# Patient Record
Sex: Female | Born: 1950 | Race: White | Hispanic: No | Marital: Married | Smoking: Never smoker
Health system: Southern US, Community
[De-identification: ages and names within clinical notes are randomized; demographics above are authoritative.]

## PROBLEM LIST (undated history)

## (undated) DIAGNOSIS — R002 Palpitations: Secondary | ICD-10-CM

## (undated) DIAGNOSIS — F32A Depression, unspecified: Secondary | ICD-10-CM

## (undated) DIAGNOSIS — T7840XA Allergy, unspecified, initial encounter: Secondary | ICD-10-CM

## (undated) DIAGNOSIS — F329 Major depressive disorder, single episode, unspecified: Secondary | ICD-10-CM

## (undated) DIAGNOSIS — J45909 Unspecified asthma, uncomplicated: Secondary | ICD-10-CM

## (undated) DIAGNOSIS — K219 Gastro-esophageal reflux disease without esophagitis: Secondary | ICD-10-CM

## (undated) DIAGNOSIS — R519 Headache, unspecified: Secondary | ICD-10-CM

## (undated) DIAGNOSIS — D649 Anemia, unspecified: Secondary | ICD-10-CM

## (undated) HISTORY — DX: Gastro-esophageal reflux disease without esophagitis: K21.9

## (undated) HISTORY — PX: TEMPOROMANDIBULAR JOINT SURGERY: SHX35

## (undated) HISTORY — PX: BREAST BIOPSY: SHX20

## (undated) HISTORY — DX: Major depressive disorder, single episode, unspecified: F32.9

## (undated) HISTORY — PX: OTHER SURGICAL HISTORY: SHX169

## (undated) HISTORY — DX: Depression, unspecified: F32.A

## (undated) HISTORY — PX: BUNIONECTOMY: SHX129

## (undated) HISTORY — DX: Allergy, unspecified, initial encounter: T78.40XA

## (undated) HISTORY — DX: Palpitations: R00.2

## (undated) HISTORY — DX: Unspecified asthma, uncomplicated: J45.909

## (undated) HISTORY — DX: Anemia, unspecified: D64.9

## (undated) HISTORY — PX: ABDOMINAL HYSTERECTOMY: SHX81

## (undated) HISTORY — PX: VARICOSE VEIN SURGERY: SHX832

---

## 1997-07-14 ENCOUNTER — Other Ambulatory Visit: Admission: RE | Admit: 1997-07-14 | Discharge: 1997-07-14 | Payer: Self-pay | Admitting: Obstetrics & Gynecology

## 1998-08-04 ENCOUNTER — Other Ambulatory Visit: Admission: RE | Admit: 1998-08-04 | Discharge: 1998-08-04 | Payer: Self-pay | Admitting: Obstetrics & Gynecology

## 1999-11-14 ENCOUNTER — Other Ambulatory Visit: Admission: RE | Admit: 1999-11-14 | Discharge: 1999-11-14 | Payer: Self-pay | Admitting: Obstetrics & Gynecology

## 2001-01-17 ENCOUNTER — Other Ambulatory Visit: Admission: RE | Admit: 2001-01-17 | Discharge: 2001-01-17 | Payer: Self-pay | Admitting: Obstetrics & Gynecology

## 2001-01-29 ENCOUNTER — Encounter: Payer: Self-pay | Admitting: Internal Medicine

## 2001-01-29 ENCOUNTER — Ambulatory Visit (HOSPITAL_COMMUNITY): Admission: RE | Admit: 2001-01-29 | Discharge: 2001-01-29 | Payer: Self-pay | Admitting: Internal Medicine

## 2001-03-04 ENCOUNTER — Encounter: Payer: Self-pay | Admitting: Gastroenterology

## 2002-02-09 ENCOUNTER — Other Ambulatory Visit: Admission: RE | Admit: 2002-02-09 | Discharge: 2002-02-09 | Payer: Self-pay | Admitting: Obstetrics & Gynecology

## 2002-10-01 ENCOUNTER — Encounter: Payer: Self-pay | Admitting: Gastroenterology

## 2002-10-07 ENCOUNTER — Encounter: Payer: Self-pay | Admitting: Gastroenterology

## 2002-10-07 ENCOUNTER — Ambulatory Visit (HOSPITAL_COMMUNITY): Admission: RE | Admit: 2002-10-07 | Discharge: 2002-10-07 | Payer: Self-pay | Admitting: Gastroenterology

## 2003-03-11 ENCOUNTER — Other Ambulatory Visit: Admission: RE | Admit: 2003-03-11 | Discharge: 2003-03-11 | Payer: Self-pay | Admitting: Obstetrics & Gynecology

## 2003-11-10 ENCOUNTER — Ambulatory Visit (HOSPITAL_COMMUNITY): Admission: RE | Admit: 2003-11-10 | Discharge: 2003-11-10 | Payer: Self-pay | Admitting: Gastroenterology

## 2003-11-10 ENCOUNTER — Encounter: Payer: Self-pay | Admitting: Gastroenterology

## 2003-12-23 ENCOUNTER — Ambulatory Visit: Payer: Self-pay

## 2004-01-05 ENCOUNTER — Ambulatory Visit: Payer: Self-pay | Admitting: Gastroenterology

## 2004-04-10 ENCOUNTER — Other Ambulatory Visit: Admission: RE | Admit: 2004-04-10 | Discharge: 2004-04-10 | Payer: Self-pay | Admitting: Obstetrics & Gynecology

## 2005-10-18 ENCOUNTER — Ambulatory Visit: Payer: Self-pay | Admitting: Gastroenterology

## 2006-11-25 ENCOUNTER — Ambulatory Visit (HOSPITAL_COMMUNITY): Admission: RE | Admit: 2006-11-25 | Discharge: 2006-11-25 | Payer: Self-pay | Admitting: Obstetrics & Gynecology

## 2006-11-25 IMAGING — CR DG CHEST 2V
2 series · 2 of 2 positions shown · non-contrast
Comparison: None. 
 Trachea is midline.  Heart size normal.  Lungs are clear.  No pleural fluid.

CLINICAL DATA: Chronic cough, congestion, fever.
 CHEST - 2 VIEW:

[view not recorded (1 of 2)]
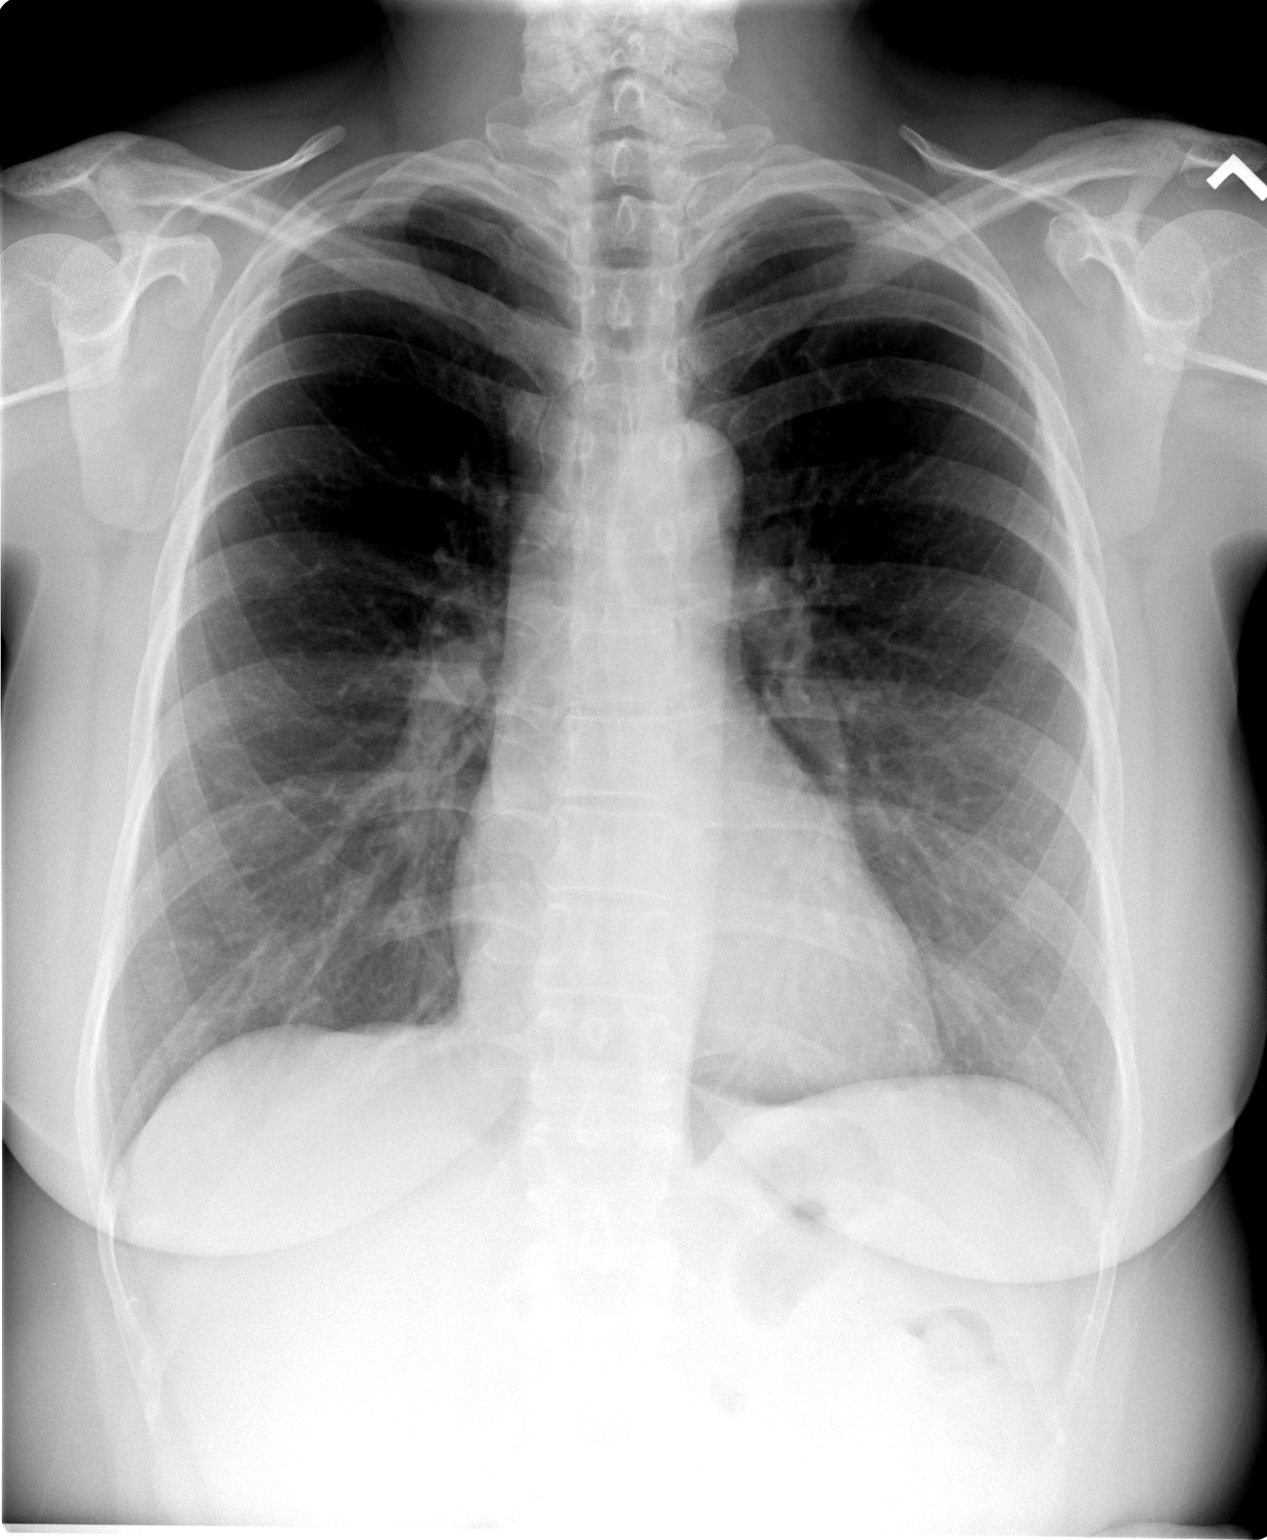

[view not recorded (2 of 2)]
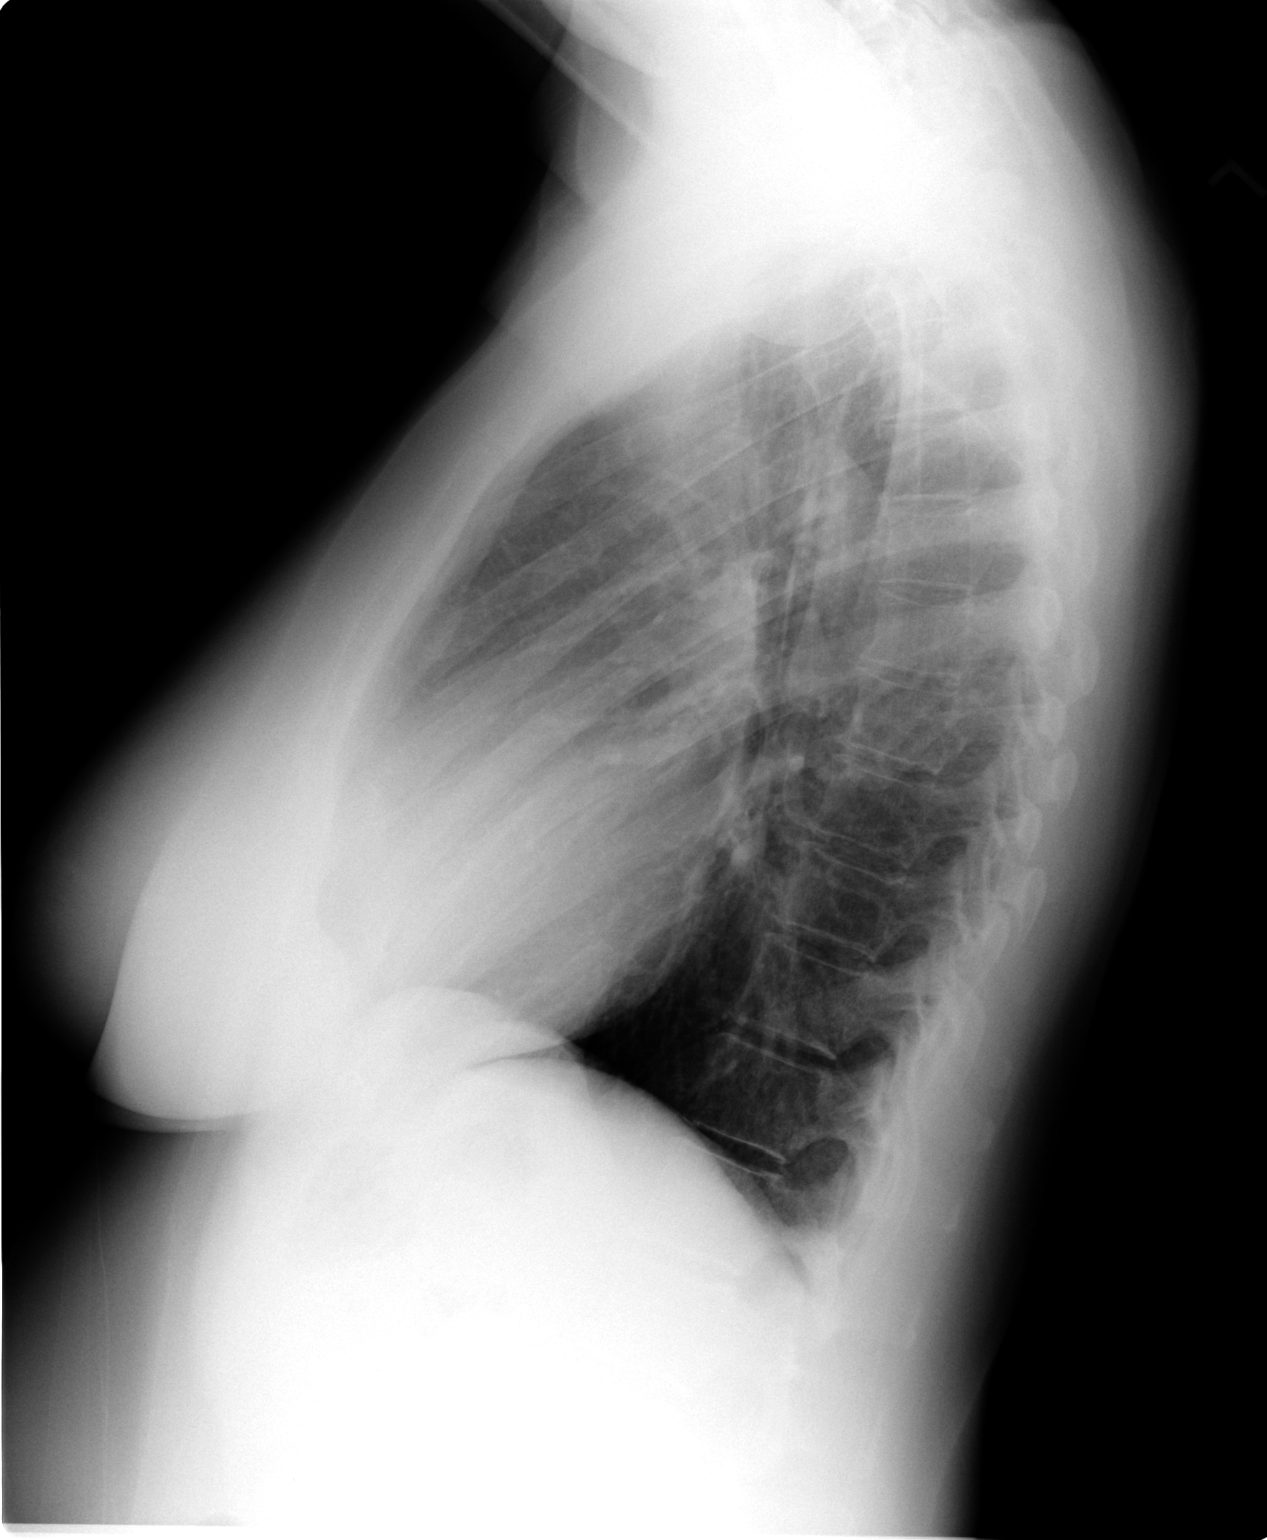

[2 of 2 positions shown; findings below may reference images not displayed]

IMPRESSION: No acute findings.

## 2006-12-03 ENCOUNTER — Ambulatory Visit (HOSPITAL_COMMUNITY): Admission: RE | Admit: 2006-12-03 | Discharge: 2006-12-04 | Payer: Self-pay | Admitting: Obstetrics & Gynecology

## 2006-12-03 ENCOUNTER — Encounter (INDEPENDENT_AMBULATORY_CARE_PROVIDER_SITE_OTHER): Payer: Self-pay | Admitting: Obstetrics & Gynecology

## 2008-01-06 ENCOUNTER — Telehealth: Payer: Self-pay | Admitting: Gastroenterology

## 2008-01-30 DIAGNOSIS — K3184 Gastroparesis: Secondary | ICD-10-CM

## 2008-01-30 DIAGNOSIS — Z862 Personal history of diseases of the blood and blood-forming organs and certain disorders involving the immune mechanism: Secondary | ICD-10-CM | POA: Insufficient documentation

## 2008-01-30 DIAGNOSIS — I6529 Occlusion and stenosis of unspecified carotid artery: Secondary | ICD-10-CM

## 2008-01-30 DIAGNOSIS — R51 Headache: Secondary | ICD-10-CM

## 2008-01-30 DIAGNOSIS — R519 Headache, unspecified: Secondary | ICD-10-CM | POA: Insufficient documentation

## 2008-01-30 DIAGNOSIS — K219 Gastro-esophageal reflux disease without esophagitis: Secondary | ICD-10-CM

## 2008-01-30 DIAGNOSIS — K5730 Diverticulosis of large intestine without perforation or abscess without bleeding: Secondary | ICD-10-CM

## 2008-01-30 DIAGNOSIS — F325 Major depressive disorder, single episode, in full remission: Secondary | ICD-10-CM

## 2008-02-04 ENCOUNTER — Ambulatory Visit: Payer: Self-pay | Admitting: Gastroenterology

## 2008-12-30 ENCOUNTER — Encounter: Admission: RE | Admit: 2008-12-30 | Discharge: 2008-12-30 | Payer: Self-pay | Admitting: Obstetrics & Gynecology

## 2009-12-05 ENCOUNTER — Ambulatory Visit (HOSPITAL_COMMUNITY)
Admission: RE | Admit: 2009-12-05 | Discharge: 2009-12-05 | Payer: Self-pay | Source: Home / Self Care | Admitting: Internal Medicine

## 2009-12-05 IMAGING — CR DG HAND COMPLETE 3+V*L*
2 series · 2 of 2 positions shown · non-contrast
Comparison: None.

CLINICAL DATA: Left hand and middle finger pain and swelling.

LEFT HAND - COMPLETE 3+ VIEW

[view not recorded (1 of 2)]
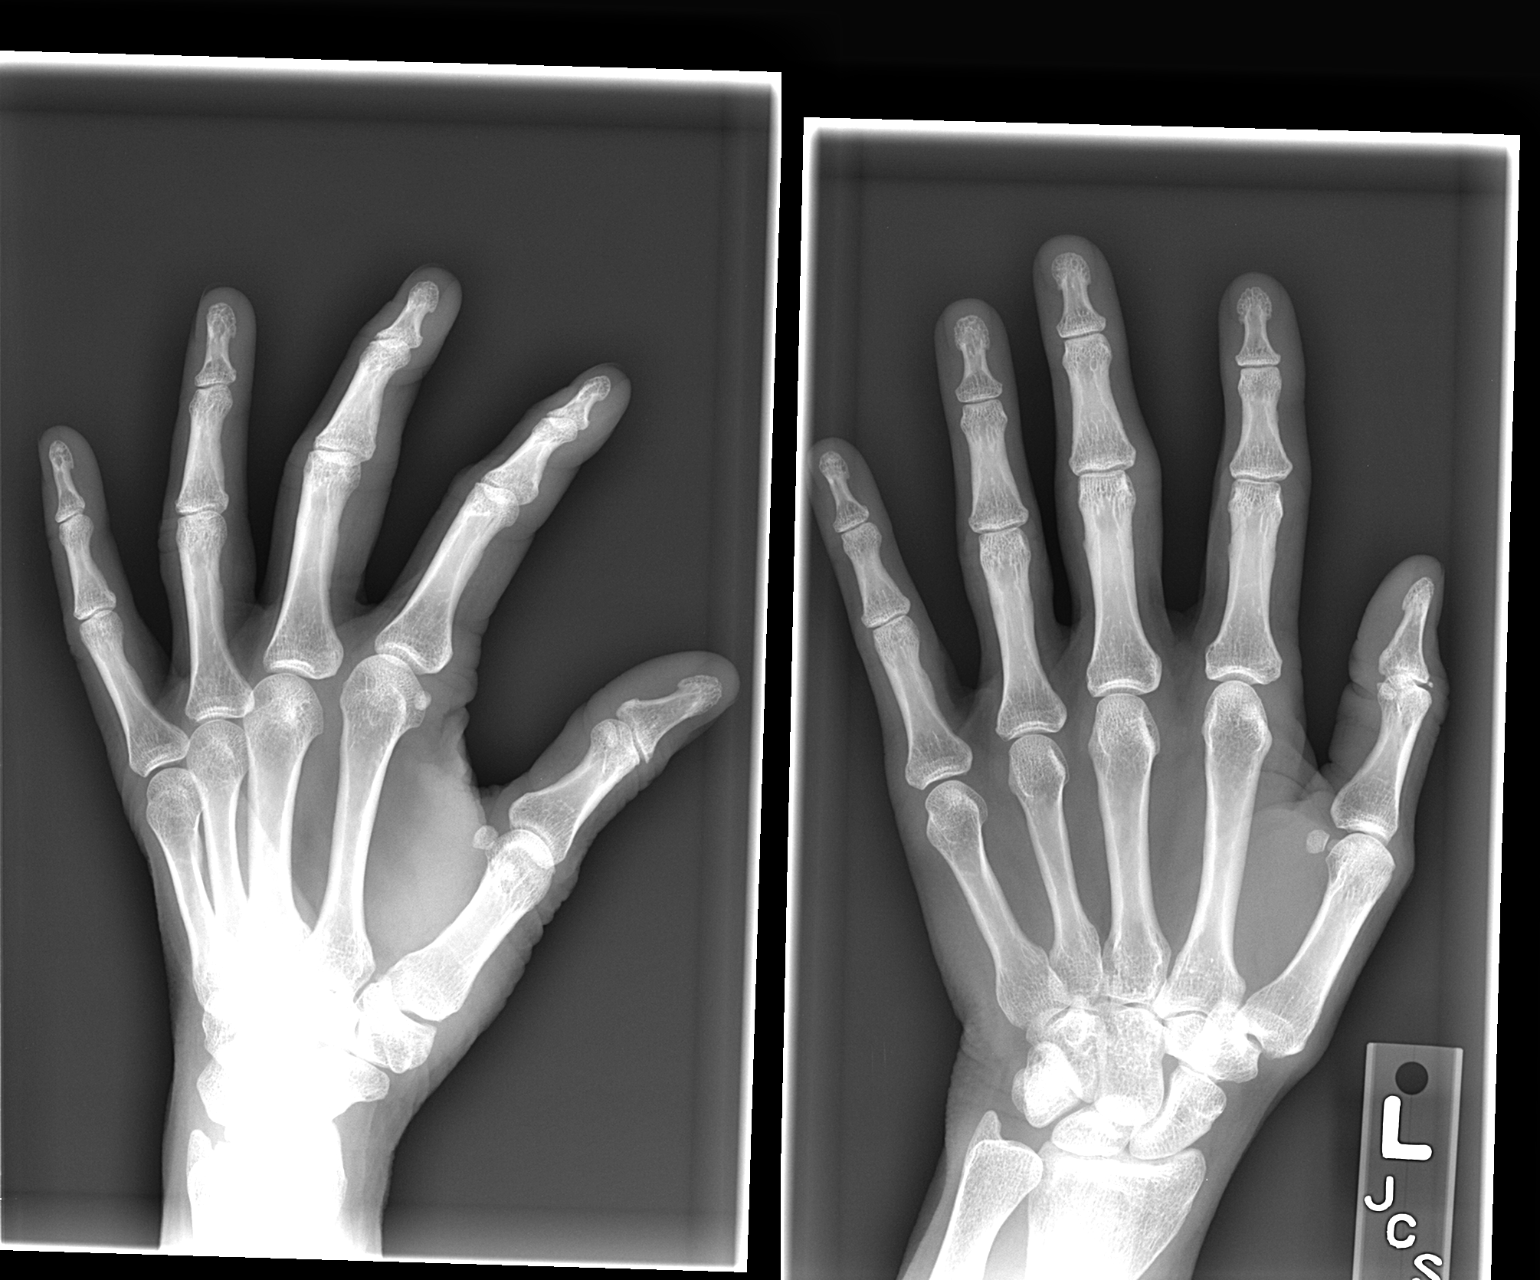

[view not recorded (2 of 2)]
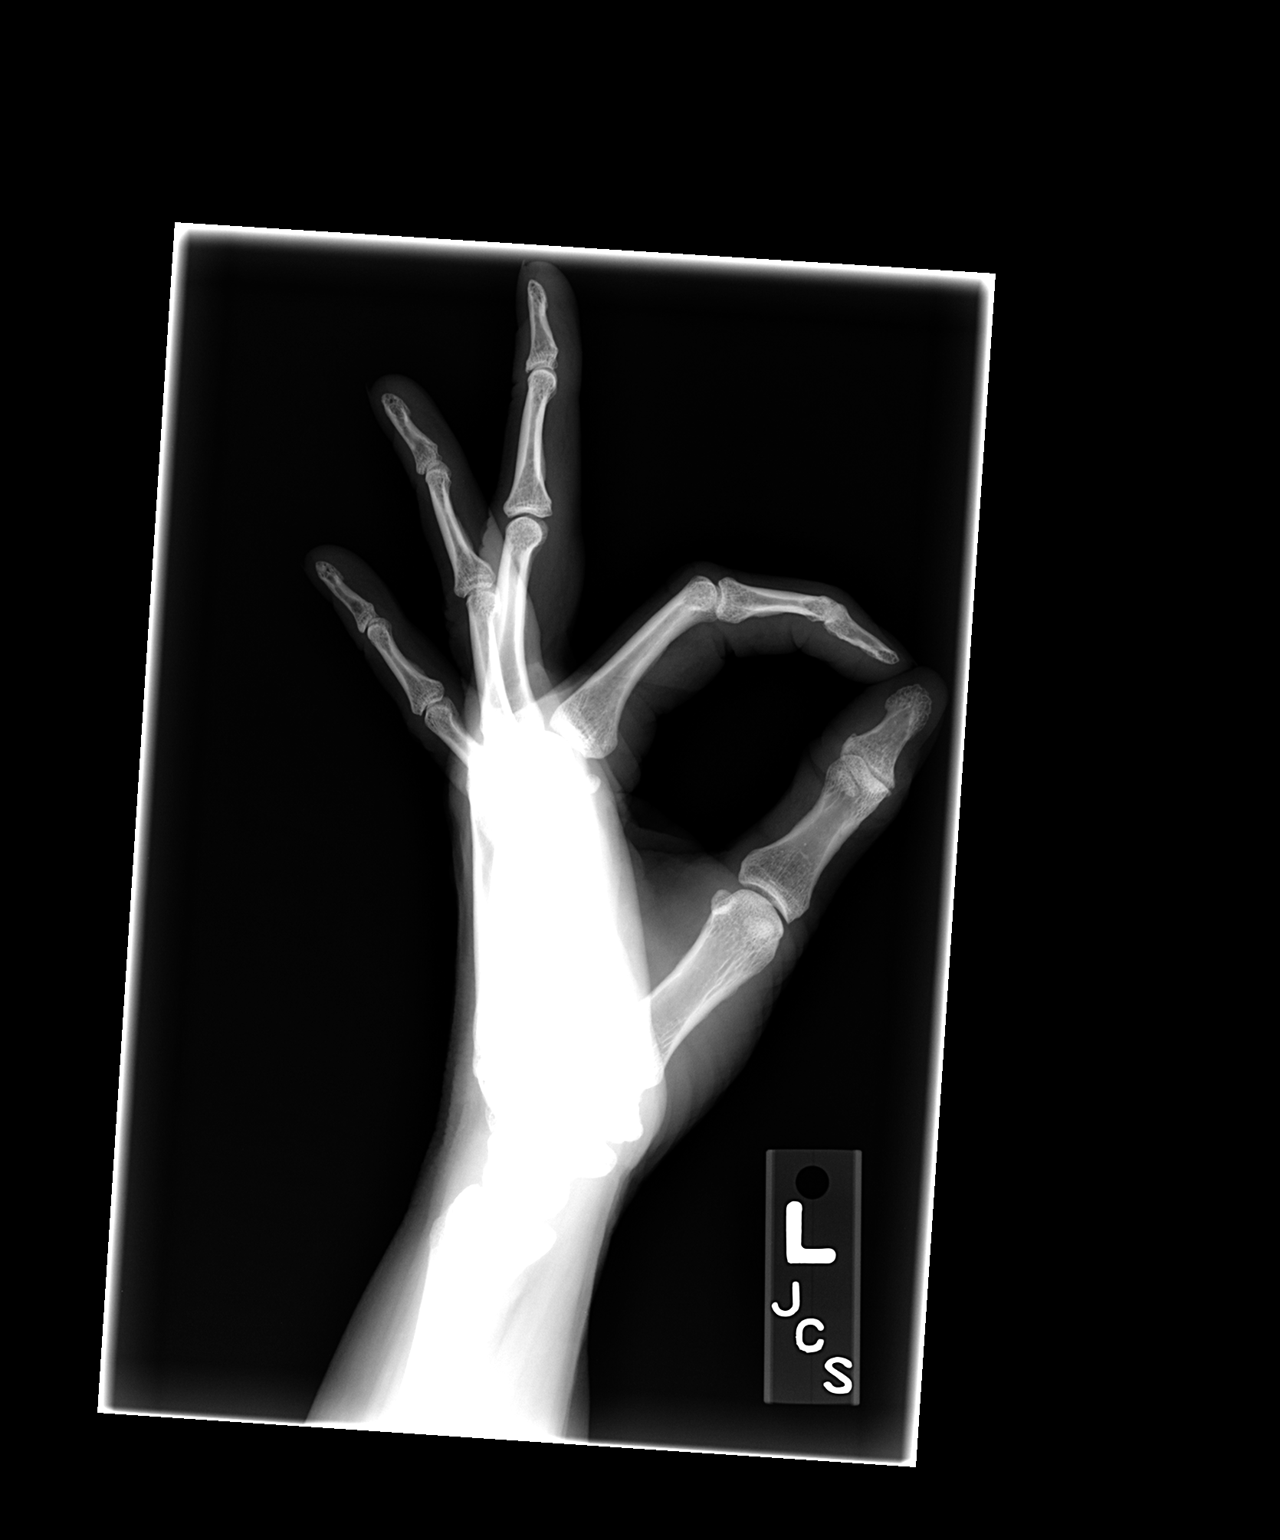

[2 of 2 positions shown; findings below may reference images not displayed]

FINDINGS: Mild soft tissue swelling is seen involving the middle
finger in the region of the PIP joint.  No evidence of soft tube
tissue gas or calcification.  No evidence of fracture or
dislocation.

Bone mineral density is normal.  No evidence of joint space
narrowing.  No evidence of marginal erosions or osteophyte
formation.  Alignment is normal.
IMPRESSION: Mild soft tissue swelling involving the middle finger in region of
PIP joint.  No other signs of arthropathy or other bone
abnormality.

## 2009-12-08 ENCOUNTER — Encounter (INDEPENDENT_AMBULATORY_CARE_PROVIDER_SITE_OTHER): Payer: Self-pay | Admitting: Internal Medicine

## 2009-12-08 ENCOUNTER — Ambulatory Visit (HOSPITAL_COMMUNITY)
Admission: RE | Admit: 2009-12-08 | Discharge: 2009-12-08 | Payer: Self-pay | Source: Home / Self Care | Attending: Internal Medicine | Admitting: Internal Medicine

## 2009-12-08 ENCOUNTER — Encounter: Payer: Self-pay | Admitting: Cardiology

## 2009-12-08 ENCOUNTER — Ambulatory Visit: Payer: Self-pay

## 2010-03-31 ENCOUNTER — Other Ambulatory Visit: Payer: Self-pay

## 2010-03-31 ENCOUNTER — Other Ambulatory Visit: Payer: Self-pay | Admitting: Gastroenterology

## 2010-03-31 MED ORDER — AMBULATORY NON FORMULARY MEDICATION: 1.0000 | Freq: Two times a day (BID) | Status: DC

## 2010-03-31 NOTE — Telephone Encounter (Signed)
Prescription will be ready for pt to pick up

## 2010-05-16 NOTE — Op Note (Signed)
NAMECHRISTENE, [name]               ACCOUNT NO.:  [id number]   MEDICAL RECORD NO.:  [id number]          PATIENT TYPE:  OIB   LOCATION:  9302                          FACILITY:  [facility]   PHYSICIAN:  Freddy Finner, M.D.   DATE OF BIRTH:  13-Aug-1950   DATE OF PROCEDURE:  12/03/2006  DATE OF DISCHARGE:                               OPERATIVE REPORT   PREOPERATIVE DIAGNOSES:  1. Complex left adnexal mass, suspected ovarian mass or pedunculated      myoma with degeneration.  2. Known uterine leiomyomata.   POSTOPERATIVE DIAGNOSES:  1. Uterine leiomyomata.  2. Left adnexal adhesions.  3. 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   OPERATIVE PROCEDURE:  Laparoscopically-assisted vaginal hysterectomy,  bilateral salpingo-oophorectomy.   INTRAOPERATIVE COMPLICATIONS:  None.   ANESTHESIA:  General endotracheal.   SURGEON:  Freddy Finner, M.D.   ASSISTANT:  Dineen Kid. Rana Snare, M.D.   The patient is a 60 year old who was discovered to have a left adnexal  mass on pelvic ultrasound following an episode of postcoital  postmenopausal bleeding.  She is known to have uterine leiomyomata and  these have been longstanding.  The mass persisted and given the concern  that it could be ovarian in the presence of fibroids, she has elected to  proceed with definitive therapy.   She was admitted on the morning of surgery.  She was given a bolus of  Ancef IV preoperatively.  She was placed in knee-high PAS hose.  She was  brought to the operating room, placed under adequate general  endotracheal anesthesia, placed in the dorsal lithotomy position using  the Salineno stirrup system.  Betadine prep was carried out using scrub and  solution with prep of the abdomen, perineum and vagina.  The bladder was  evacuated with sterile technique.  A Hulka tenaculum was attached to the  cervix under direct visualization.  The vaginal vault was somewhat  narrowed.  Sterile drapes were applied.  Two small incisions were made,  one at the umbilicus, one just above the symphysis.  Through the upper  incision an 11-mm bladed disposable trocar was introduced while  elevating the anterior abdominal wall manually.  Direct inspection  revealed adequate placement of the device with no evidence of injury on  entry.  A second trocar was placed in the lower incision, in this case a  5-mm bladed disposable trocar.  Through it various instruments were used  during the procedure including a blunt probe, spring-loaded grasping  forceps and the Nezhat irrigation system.  A systematic examination of  the pelvic and abdominal contents was carried out.  There was a single  band-like adhesion of the right ovary to the right pelvic sidewall.  The  tube and ovary on this side were otherwise normal.  On the left side  there were some adhesions of epiploica of sigmoid to the  infundibulopelvic ligament.  These were lysed.  The left ovary was  adherent to the left pelvic sidewall but was freed easily with blunt  probe and traction for destruction of the adhesions.  The ovary itself  was small and appropriate for the postmenopausal state.  There was a  nodule apparently measuring approximately 2 cm in the broad ligament of  the lower uterine segment.  It is presumed that this is the mass that  was identified.  There was no other evidence of peritoneal or upper  abdominal disease.  The appendix was visualized.  The liver and upper  abdomen could not be visualized because of adhesions from childhood  surgery.  Using the Gyrus tripolar device, the infundibulopelvic  ligament, round ligament, upper broad ligament of the right side was  progressively sealed and divided.  This was carried down to a level just  above the uterine arteries.  The left side was then treated essentially  identically after a lysis of adhesions.  Attention was turned vaginally.  A posterior  weighted vaginal retractor was placed.  Deaver retractors  were used anteriorly, then laterally for exposure.  A posterior weighted  speculum was placed.  The cervix was grasped ultimately with a single-  tooth tenaculum.  The vaginal vault was narrow and the cervix small.  A  colpotomy incision was made while tenting the mucosa posterior to the  cervix.  After opening the cul-de-sac, a Bonnano posterior weighted  retractor was placed.  The cervix was circumscribed with a scalpel to  release the mucosa.  The bladder was advanced off the cervix.  The  uterosacral pedicles were sealed and divided using the LigaSure system.  A bladder pillar was separately developed in a similar fashion.  The  dissection of the bladder off the cervix was continued and anterior  peritoneum opened.  The cardinal ligament pedicles and vessel pedicles  were then progressively sealed and divided on each side.  This allowed  delivery of the uterus, tubes, and ovaries through the vaginal  introitus.  The bleeding source on the right sidewall was controlled  with the LigaSure system.  Angles of the vagina were anchored to  uterosacrals with mattress sutures of 0 Monocryl.  The uterosacrals were  plicated and the posterior peritoneum closed with interrupted 0 Monocryl  suture.  The cuff was closed vertically with figure-of-eights of 0  Monocryl.  Reinspection laparoscopically was then carried out using the  Nezhat system.  One bleeding source along the right pelvic sidewall was  easily controlled with the bipolar coagulation.  Hemostasis was  confirmed under reduced intra-abdominal pressure and irrigation.  The  irrigating solution was aspirated from the abdomen and all gas was  allowed to escape from the abdomen.  The instruments were removed.  The  skin incisions were anesthetized with 0.25% plain Marcaine.  Subcuticular sutures of 3-0 Dexon were then used to close the incisions.  Steri-Strips were applied to the  lower incision.  A sterile dressing was  applied at the umbilicus.  The patient was awakened, taken to the  recovery room in good condition.      Freddy Finner, M.D.  Electronically Signed     WRN/MEDQ  D:  12/03/2006  T:  12/03/2006  Job:  [id number]

## 2010-05-16 NOTE — H&P (Signed)
[name], [name]               ACCOUNT NO.:  [id number]   MEDICAL RECORD NO.:  [id number]          PATIENT TYPE:  AMB   LOCATION:                                FACILITY:  [facility]   PHYSICIAN:  Freddy Finner, M.D.   DATE OF BIRTH:  21-Aug-1950   DATE OF ADMISSION:  12/03/2006  DATE OF DISCHARGE:  11/25/2006                              HISTORY & PHYSICAL   ADMITTING DIAGNOSIS:  Complex left ovarian cyst uterine leiomyomata.   HISTORY OF PRESENT ILLNESS:  The patient is a 60 year old white married  female gravida 2, para 2, who was seen in the office in early September  complaining of postcoital bleeding and painful intercourse.  Pelvic  ultrasound identified a complex left adnexal mass consistent with  ovarian dermoid or endometrioma.  The patient is known to have a  longstanding uterine leiomyomata which were multiple.  The mass  persisted, and based on the combination of findings including a complex  cyst, the possibility of a small contralateral dermoid and the presence  of fibroids, we have elected to proceed with laparoscopic assisted  vaginal hysterectomy bilateral salpingo-oophorectomy.  She is admitted  now for that purpose.  Potential risk of procedure including infection,  injury to other organs, intraoperative and postoperative hemorrhage and  the possibility of converting to an abdominal procedure have been  discussed and accepted by the patient.   Her current review of systems is otherwise negative. There are no  cardiopulmonary GI or GU complaints at the present time.  She did have a  persistent cough for which she received two courses of antibiotics and a  rapidly tapering prednisone dose pack which was completed about 72 hours  prior to admission which has cleared her cough.  She does occasionally  have low grade temperature in the 99 rates.   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  For this, she takes  Singulair.  She  has tachy arrhythmias for which she takes Toprol XL/metoprolol 25 mg.  She also takes Zyrtec D episodically.  She has GERD for which she takes  Nexium 40 mg a day.  Her most recent hormone replacement therapy for  menopausal symptoms was Femhrt 1 mg/5 mg.  She also takes domperidone 10  mg 2-4 times a day.  She takes Macrodantin 50 mg p.r.n. as needed.   ALLERGIES:  REGLAN AND PENICILLIN.   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  She has had two cesarean deliveries.  Her first was for a  breech, second was for surgically scarred uterus.   SOCIAL HISTORY:  She does not use cigarettes or alcohol.  She has never  had a blood transfusion.   FAMILY HISTORY:  Remarkable for uterine cancer and for breast cancer.   PHYSICAL EXAMINATION:  HEENT:  Grossly within normal limits.  VITAL SIGNS:  Blood pressure in the office 119/62.  NECK:  Thyroid gland is not palpably enlarged  BREAST:  Normal.  No palpable masses.  No nipple discharge.  No skin  change.  (mammogram was normal in November 2007).  HEART:  Normal sinus rhythm without murmurs, rubs or gallops audible to  my examination.  CHEST:  Clear to auscultation throughout (recent chest x-ray was  normal).  ABDOMEN:  Soft, nontender without appreciable organomegaly or palpable  masses.  PELVIC:  External genitalia, vagina and cervix were normal to  inspection.  Bimanual reveals uterus to be anterior position 8-9 cm  size.  The adnexal mass was not easily palpable distal and bimanual  exam.  RECTUM:  The rectum is palpably normal.  Rectovaginal exam confirms the  above findings.  EXTREMITIES:  Without cyanosis, clubbing or edema.   ASSESSMENT:  1. Complex left adnexal mass.  2. Uterine leiomyomata.  3. Family history of uterine cancer and breast cancer.   PLAN:  Laparoscopically assisted vaginal hysterectomy, bilateral  salpingo-oophorectomy.  She will begin antibiotic  prophylaxis  perioperatively and will be placed in serial compression hose for deep  vein thrombosis prophylaxis.      Freddy Finner, M.D.  Electronically Signed     WRN/MEDQ  D:  12/02/2006  T:  12/03/2006  Job:  [id number]

## 2010-05-19 NOTE — Assessment & Plan Note (Signed)
[facility]                           GASTROENTEROLOGY OFFICE NOTE   NAME:Boerner, [name]                      MRN:          [id number]  DATE:10/18/2005                            DOB:          10-11-1950    PROBLEM:  Gastroparesis and gastroesophageal reflux disease.   Ms. [name] has returned for an annual visit.  On domperidone 10 mg four  times a day she is doing quite well.  She has no GI complaints, including  reflux, nausea or abdominal pain.  She altogether is feeling quite well.   MEDICATIONS:  Wellbutrin, Zyrtec, Singulair, an oral contraceptive,  domperidone, Nexium and Toprol XL.   PHYSICAL EXAMINATION:  Pulse 62, blood pressure 110/70, weight 148.   IMPRESSION:  1. Gastroesophageal reflux disease - exacerbated by gastroparesis - well      controlled with domperidone and Nexium.  2. Idiopathic gastroparesis.  3. Diverticulosis.   RECOMMENDATIONS:  1. Continue current regimen.  I explained the risk of arrhythmias with      domperidone which are similar to cisapride, which was pulled from the      [place] market.  She appears to understand this and wishes to continue      domperidone.  2. Followup colonoscopy in approximately 5 years.       Barbette Hair. Arlyce Dice, MD,FACG      RDK/MedQ  DD:  10/18/2005  DT:  10/19/2005  Job #:  [id number]   cc:   Lovenia Kim, D.O.

## 2010-05-19 NOTE — Discharge Summary (Signed)
[name], [name]               ACCOUNT NO.:  [id number]   MEDICAL RECORD NO.:  [id number]          PATIENT TYPE:  OIB   LOCATION:  9302                          FACILITY:  [facility]   PHYSICIAN:  Freddy Finner, M.D.   DATE OF BIRTH:  March 06, 1950   DATE OF ADMISSION:  12/03/2006  DATE OF DISCHARGE:  12/04/2006                               DISCHARGE SUMMARY   DISCHARGE DIAGNOSES:  Clinical findings of complex left adnexal mass  which turned out to be an intraligamentous benign uterine leiomyoma.  Addition uterine benign leiomyomata were identified as well as uterine  adenomyosis   OPERATIVE PROCEDURE:  1. Laparoscopically assisted vaginal hysterectomy.  2. Bilateral salpingo-oophorectomy.   INTRAOPERATIVE AND POSTOPERATIVE COMPLICATIONS:  None.   DISPOSITION:  The patient was in satisfactory improved condition at the  time of her discharge on the 1st postoperative day.  She was ambulating  without assistance, having adequate bowel and bladder function.  She was  discharged home on a regular diet with followup in 2 weeks with routine  discharge instructions.   DISCHARGE MEDICATIONS:  1. She was given Percocet 5/325 to be taken as needed for      postoperative pain.  2. She is to continue her Premarin orally for hormone replacement      therapy.   HISTORY OF PRESENT ILLNESS AND PHYSICAL EXAMINATION:  Details of the  present illness, past history, family history, review of systems and  physical exam are recorded in the admission note.  Briefly, the physical  findings were remarkable for uterine leiomyomata and for a complex left  adnexal mass which appeared to be ovarian in origin by ultrasound.  She  was admitted at this time for surgery.   LABORATORY DATA DURING HOSPITALIZATION:  Included a normal EKG, rhythm  strip, a normal CBC on admission with hemoglobin of 12.4, normal  prothrombin time, PTT and urinalysis on admission and postoperative  hemoglobin of 9.4.   HOSPITAL  COURSE:  The patient was admitted on the morning of surgery.  She was taken to the operating room after receiving a bolus of Ancef  preoperatively.  She was placed in PAS compression hose.  She was taken  to the operating room, placed under adequate general endotracheal  anesthesia, where the above surgical procedure was accomplished.  There  were no significant intraoperative complications.  Postoperatively, she  remained afebrile.  She  had no significant postoperative complications.  By the evening of the  first postoperative day, she was ambulating without assistance.  She had  further disposition as noted above.  She is to resume all of her  preoperative medications which are recorded in the reconciliation sheet  and the body of the chart.      Freddy Finner, M.D.  Electronically Signed     WRN/MEDQ  D:  01/11/2007  T:  01/12/2007  Job:  [id number]

## 2010-08-14 ENCOUNTER — Other Ambulatory Visit: Payer: Self-pay

## 2010-10-09 LAB — CBC
HCT: 27.2 — ABNORMAL LOW
Platelets: 308
RDW: 12.7

## 2010-10-10 LAB — URINALYSIS, ROUTINE W REFLEX MICROSCOPIC
Bilirubin Urine: NEGATIVE
Ketones, ur: NEGATIVE
Nitrite: NEGATIVE
Specific Gravity, Urine: 1.025
Urobilinogen, UA: 0.2

## 2010-10-10 LAB — PROTIME-INR: INR: 0.9

## 2010-10-10 LAB — CBC
Hemoglobin: 12.4
RBC: 3.96
RDW: 12.5

## 2010-10-10 LAB — URINE MICROSCOPIC-ADD ON

## 2010-10-10 LAB — APTT: aPTT: 29

## 2011-03-20 ENCOUNTER — Other Ambulatory Visit: Payer: Self-pay | Admitting: Dermatology

## 2011-04-26 ENCOUNTER — Other Ambulatory Visit: Payer: Self-pay | Admitting: Dermatology

## 2011-09-05 ENCOUNTER — Other Ambulatory Visit: Payer: Self-pay | Admitting: Gastroenterology

## 2011-09-05 ENCOUNTER — Encounter: Payer: Self-pay | Admitting: Gastroenterology

## 2011-09-06 MED ORDER — AMBULATORY NON FORMULARY MEDICATION: 1.0000 | Freq: Two times a day (BID) | Status: DC

## 2011-09-06 NOTE — Telephone Encounter (Signed)
L/M for patient that prescription was printed for her to pick up today

## 2011-11-09 ENCOUNTER — Encounter: Payer: Self-pay | Admitting: Gastroenterology

## 2013-01-12 ENCOUNTER — Other Ambulatory Visit: Payer: Self-pay | Admitting: Obstetrics & Gynecology

## 2013-01-12 DIAGNOSIS — R928 Other abnormal and inconclusive findings on diagnostic imaging of breast: Secondary | ICD-10-CM

## 2013-01-20 ENCOUNTER — Ambulatory Visit
Admission: RE | Admit: 2013-01-20 | Discharge: 2013-01-20 | Disposition: A | Discharge status: Home / Self Care | Payer: BC Managed Care – PPO | Source: Ambulatory Visit | Attending: Obstetrics & Gynecology | Admitting: Obstetrics & Gynecology

## 2013-01-20 DIAGNOSIS — R928 Other abnormal and inconclusive findings on diagnostic imaging of breast: Secondary | ICD-10-CM

## 2013-03-08 ENCOUNTER — Ambulatory Visit (INDEPENDENT_AMBULATORY_CARE_PROVIDER_SITE_OTHER): Payer: BC Managed Care – PPO | Admitting: Family Medicine

## 2013-03-08 VITALS — BP 114/70 | HR 84 | Temp 99.0°F | Ht 66.0 in | Wt 155.0 lb

## 2013-03-08 DIAGNOSIS — J040 Acute laryngitis: Secondary | ICD-10-CM

## 2013-03-08 DIAGNOSIS — J029 Acute pharyngitis, unspecified: Secondary | ICD-10-CM

## 2013-03-08 DIAGNOSIS — R059 Cough, unspecified: Secondary | ICD-10-CM

## 2013-03-08 DIAGNOSIS — R05 Cough: Secondary | ICD-10-CM

## 2013-03-08 DIAGNOSIS — R040 Epistaxis: Secondary | ICD-10-CM

## 2013-03-08 DIAGNOSIS — J329 Chronic sinusitis, unspecified: Secondary | ICD-10-CM

## 2013-03-08 DIAGNOSIS — J209 Acute bronchitis, unspecified: Secondary | ICD-10-CM

## 2013-03-08 MED ORDER — HYDROCODONE-HOMATROPINE 5-1.5 MG/5ML PO SYRP: 5.0000 mL | ORAL_SOLUTION | Freq: Three times a day (TID) | ORAL | Status: DC | PRN

## 2013-03-08 MED ORDER — AZITHROMYCIN 250 MG PO TABS: ORAL_TABLET | ORAL | Status: DC

## 2013-03-08 NOTE — Patient Instructions (Signed)
Sinusitis  Sinusitis is redness, soreness, and swelling (inflammation) of the paranasal sinuses. Paranasal sinuses are air pockets within the bones of your face (beneath the eyes, the middle of the forehead, or above the eyes). In healthy paranasal sinuses, mucus is able to drain out, and air is able to circulate through them by way of your nose. However, when your paranasal sinuses are inflamed, mucus and air can become trapped. This can allow bacteria and other germs to grow and cause infection.  Sinusitis can develop quickly and last only a short time (acute) or continue over a long period (chronic). Sinusitis that lasts for more than 12 weeks is considered chronic.   CAUSES   Causes of sinusitis include:   Allergies.   Structural abnormalities, such as displacement of the cartilage that separates your nostrils (deviated septum), which can decrease the air flow through your nose and sinuses and affect sinus drainage.   Functional abnormalities, such as when the small hairs (cilia) that line your sinuses and help remove mucus do not work properly or are not present.  SYMPTOMS   Symptoms of acute and chronic sinusitis are the same. The primary symptoms are pain and pressure around the affected sinuses. Other symptoms include:   Upper toothache.   Earache.   Headache.   Bad breath.   Decreased sense of smell and taste.   A cough, which worsens when you are lying flat.   Fatigue.   Fever.   Thick drainage from your nose, which often is green and may contain pus (purulent).   Swelling and warmth over the affected sinuses.  DIAGNOSIS   Your caregiver will perform a physical exam. During the exam, your caregiver may:   Look in your nose for signs of abnormal growths in your nostrils (nasal polyps).   Tap over the affected sinus to check for signs of infection.   View the inside of your sinuses (endoscopy) with a special imaging device with a light attached (endoscope), which is inserted into your  sinuses.  If your caregiver suspects that you have chronic sinusitis, one or more of the following tests may be recommended:   Allergy tests.   Nasal culture A sample of mucus is taken from your nose and sent to a lab and screened for bacteria.   Nasal cytology A sample of mucus is taken from your nose and examined by your caregiver to determine if your sinusitis is related to an allergy.  TREATMENT   Most cases of acute sinusitis are related to a viral infection and will resolve on their own within 10 days. Sometimes medicines are prescribed to help relieve symptoms (pain medicine, decongestants, nasal steroid sprays, or saline sprays).   However, for sinusitis related to a bacterial infection, your caregiver will prescribe antibiotic medicines. These are medicines that will help kill the bacteria causing the infection.   Rarely, sinusitis is caused by a fungal infection. In theses cases, your caregiver will prescribe antifungal medicine.  For some cases of chronic sinusitis, surgery is needed. Generally, these are cases in which sinusitis recurs more than 3 times per year, despite other treatments.  HOME CARE INSTRUCTIONS    Drink plenty of water. Water helps thin the mucus so your sinuses can drain more easily.   Use a humidifier.   Inhale steam 3 to 4 times a day (for example, sit in the bathroom with the shower running).   Apply a warm, moist washcloth to your face 3 to 4 times a day,   or as directed by your caregiver.   Use saline nasal sprays to help moisten and clean your sinuses.   Take over-the-counter or prescription medicines for pain, discomfort, or fever only as directed by your caregiver.  SEEK IMMEDIATE MEDICAL CARE IF:   You have increasing pain or severe headaches.   You have nausea, vomiting, or drowsiness.   You have swelling around your face.   You have vision problems.   You have a stiff neck.   You have difficulty breathing.  MAKE SURE YOU:    Understand these  instructions.   Will watch your condition.   Will get help right away if you are not doing well or get worse.  Document Released: 12/18/2004 Document Revised: 03/12/2011 Document Reviewed: 01/02/2011  [employer] Patient Information 2014 [employer]          Bronchitis  Bronchitis is inflammation of the airways that extend from the windpipe into the lungs (bronchi). The inflammation often causes mucus to develop, which leads to a cough. If the inflammation becomes severe, it may cause shortness of breath.  CAUSES   Bronchitis may be caused by:    Viral infections.    Bacteria.    Cigarette smoke.    Allergens, pollutants, and other irritants.   SIGNS AND SYMPTOMS   The most common symptom of bronchitis is a frequent cough that produces mucus. Other symptoms include:   Fever.    Body aches.    Chest congestion.    Chills.    Shortness of breath.    Sore throat.   DIAGNOSIS   Bronchitis is usually diagnosed through a medical history and physical exam. Tests, such as chest X-rays, are sometimes done to rule out other conditions.   TREATMENT   You may need to avoid contact with whatever caused the problem (smoking, for example). Medicines are sometimes needed. These may include:   Antibiotics. These may be prescribed if the condition is caused by bacteria.   Cough suppressants. These may be prescribed for relief of cough symptoms.    Inhaled medicines. These may be prescribed to help open your airways and make it easier for you to breathe.    Steroid medicines. These may be prescribed for those with recurrent (chronic) bronchitis.  HOME CARE INSTRUCTIONS   Get plenty of rest.    Drink enough fluids to keep your urine clear or pale yellow (unless you have a medical condition that requires fluid restriction). Increasing fluids may help thin your secretions and will prevent dehydration.    Only take over-the-counter or prescription medicines as directed by your health care  provider.   Only take antibiotics as directed. Make sure you finish them even if you start to feel better.   Avoid secondhand smoke, irritating chemicals, and strong fumes. These will make bronchitis worse. If you are a smoker, quit smoking. Consider using nicotine gum or skin patches to help control withdrawal symptoms. Quitting smoking will help your lungs heal faster.    Put a cool-mist humidifier in your bedroom at night to moisten the air. This may help loosen mucus. Change the water in the humidifier daily. You can also run the hot water in your shower and sit in the bathroom with the door closed for 5 10 minutes.    Follow up with your health care provider as directed.    Wash your hands frequently to avoid catching bronchitis again or spreading an infection to others.   SEEK MEDICAL CARE IF:  Your symptoms   do not improve after 1 week of treatment.   SEEK IMMEDIATE MEDICAL CARE IF:   Your fever increases.   You have chills.    You have chest pain.    You have worsening shortness of breath.    You have bloody sputum.   You faint.   You have lightheadedness.   You have a severe headache.    You vomit repeatedly.  MAKE SURE YOU:    Understand these instructions.   Will watch your condition.   Will get help right away if you are not doing well or get worse.  Document Released: 12/18/2004 Document Revised: 10/08/2012 Document Reviewed: 08/12/2012  [employer] Patient Information 2014 [employer]

## 2013-03-08 NOTE — Progress Notes (Signed)
Subjective:    Patient ID: [name], female    DOB: 02-Nov-1950, 63 y.o.   MRN: [id number]  HPI This chart was scribed for Robyn Haber, MD by Thea Alken, ED Scribe. This patient was seen in room 8 and the patient's care was started at 11:58 AM.  HPI Comments: [name] is a 63 y.o. female who presents to the [facility] complaining of cold symptoms onset 4 days ago. Pt states that 4 days ago her symptoms started with a sore throat. Pt states that later that night she developed laryngitis. She reports that 2 days ago is when she started having cough, nasal congestion and a low grade fever. She states that her cough has been tight but is loose at times.  Pt reports that she took her temperature this morning it was 100. Pt reports h/o asthma. She reports that she does not have an inhaler but does have a spiriva.    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     Past Medical History  Diagnosis Date   Allergy    Anemia    Asthma    Depression    GERD (gastroesophageal reflux disease)    Allergies  Allergen Reactions   Metoclopramide Hcl    Penicillins    Sulfamethoxazole-Trimethoprim    Ciprofloxacin Palpitations   Prior to Admission medications   Medication Sig Start Date End Date Taking? Authorizing Provider  AMBULATORY NON FORMULARY MEDICATION Take 1 tablet by mouth 2 (two) times daily. Medication Name: Domperidone 10mg  09/06/11  Yes Inda Castle, MD  Cholecalciferol (VITAMIN D) 2000 UNITS tablet Take 2,000 Units by mouth daily.   Yes Historical Provider, MD  magnesium oxide (MAG-OX) 400 MG tablet Take 400 mg by mouth daily.   Yes Historical Provider, MD  meloxicam (MOBIC) 15 MG tablet Take 15 mg by mouth daily.   Yes Historical Provider, MD  montelukast (SINGULAIR) 10 MG tablet Take 10 mg by mouth at bedtime.   Yes Historical Provider, MD  Pantoprazole Sodium (PROTONIX PO) Take 40 mg by mouth daily.   Yes Historical Provider, MD  verapamil  (COVERA HS) 180 MG (CO) 24 hr tablet Take 180 mg by mouth at bedtime.   Yes Historical Provider, MD  vitamin B-12 (CYANOCOBALAMIN) 1000 MCG tablet Take 1,000 mcg by mouth daily.   Yes Historical Provider, MD    Review of Systems  Constitutional: Positive for fever. Negative for chills.  HENT: Positive for congestion and sore throat.   Respiratory: Positive for cough.   Gastrointestinal: Negative for nausea, vomiting and diarrhea.       Objective:   Physical Exam  Nursing note and vitals reviewed. Constitutional: She is oriented to person, place, and time. She appears well-developed and well-nourished. No distress.  HENT:  Head: Normocephalic and atraumatic.  Eyes: EOM are normal.  Neck: Neck supple.  Cardiovascular: Normal rate.   Pulmonary/Chest: Effort normal. She has rales.  Musculoskeletal: Normal range of motion.  Neurological: She is alert and oriented to person, place, and time.  Skin: Skin is warm and dry.  Psychiatric: She has a normal mood and affect. Her behavior is normal.   a few rales at the bases Oropharynx red without exudates or airway swelling    Assessment & Plan:   Sinusitis - Plan: HYDROcodone-homatropine (HYCODAN) 5-1.5 MG/5ML syrup, azithromycin (ZITHROMAX Z-PAK) 250 MG tablet  Acute bronchitis - Plan: HYDROcodone-homatropine (HYCODAN) 5-1.5 MG/5ML syrup, azithromycin (ZITHROMAX Z-PAK) 250 MG tablet  Acute  pharyngitis - Plan: HYDROcodone-homatropine (HYCODAN) 5-1.5 MG/5ML syrup, azithromycin (ZITHROMAX Z-PAK) 250 MG tablet  Laryngitis - Plan: HYDROcodone-homatropine (HYCODAN) 5-1.5 MG/5ML syrup, azithromycin (ZITHROMAX Z-PAK) 250 MG tablet  Cough - Plan: HYDROcodone-homatropine (HYCODAN) 5-1.5 MG/5ML syrup, azithromycin (ZITHROMAX Z-PAK) 250 MG tablet  Epistaxis - Plan: HYDROcodone-homatropine (HYCODAN) 5-1.5 MG/5ML syrup, azithromycin (ZITHROMAX Z-PAK) 250 MG tablet  Signed, Robyn Haber, MD

## 2013-03-11 ENCOUNTER — Ambulatory Visit (INDEPENDENT_AMBULATORY_CARE_PROVIDER_SITE_OTHER): Payer: BC Managed Care – PPO | Admitting: Emergency Medicine

## 2013-03-11 ENCOUNTER — Encounter: Payer: Self-pay | Admitting: Emergency Medicine

## 2013-03-11 VITALS — BP 144/72 | HR 102 | Temp 100.0°F | Resp 18 | Ht 64.5 in | Wt 156.0 lb

## 2013-03-11 DIAGNOSIS — R0602 Shortness of breath: Secondary | ICD-10-CM

## 2013-03-11 DIAGNOSIS — J209 Acute bronchitis, unspecified: Secondary | ICD-10-CM

## 2013-03-11 DIAGNOSIS — J329 Chronic sinusitis, unspecified: Secondary | ICD-10-CM

## 2013-03-11 MED ORDER — ALBUTEROL SULFATE HFA 108 (90 BASE) MCG/ACT IN AERS: 1.0000 | INHALATION_SPRAY | RESPIRATORY_TRACT | Status: DC | PRN

## 2013-03-11 MED ORDER — PREDNISONE 10 MG PO TABS: ORAL_TABLET | ORAL | Status: DC

## 2013-03-11 MED ORDER — IPRATROPIUM-ALBUTEROL 0.5-2.5 (3) MG/3ML IN SOLN: 3.0000 mL | Freq: Once | RESPIRATORY_TRACT | Status: DC

## 2013-03-11 MED ORDER — AMBULATORY NON FORMULARY MEDICATION: 1.0000 | Freq: Two times a day (BID) | Status: DC

## 2013-03-11 MED ORDER — BENZONATATE 200 MG PO CAPS: 200.0000 mg | ORAL_CAPSULE | Freq: Three times a day (TID) | ORAL | Status: DC | PRN

## 2013-03-11 MED ORDER — DOXYCYCLINE HYCLATE 100 MG PO TABS: 100.0000 mg | ORAL_TABLET | Freq: Two times a day (BID) | ORAL | Status: DC

## 2013-03-11 NOTE — Patient Instructions (Signed)
Pneumonia, Adult Pneumonia is an infection of the lungs. It may be caused by a germ (virus or bacteria). Some types of pneumonia can spread easily from person to person. This can happen when you cough or sneeze. HOME CARE  Only take medicine as told by your doctor.  Take your medicine (antibiotics) as told. Finish it even if you start to feel better.  Do not smoke.  You may use a vaporizer or humidifier in your room. This can help loosen thick spit (mucus).  Sleep so you are almost sitting up (semi-upright). This helps reduce coughing.  Rest. A shot (vaccine) can help prevent pneumonia. Shots are often advised for:  People over 65 years old.  Patients on chemotherapy.  People with long-term (chronic) lung problems.  People with immune system problems. GET HELP RIGHT AWAY IF:   You are getting worse.  You cannot control your cough, and you are losing sleep.  You cough up blood.  Your pain gets worse, even with medicine.  You have a fever.  Any of your problems are getting worse, not better.  You have shortness of breath or chest pain. MAKE SURE YOU:   Understand these instructions.  Will watch your condition.  Will get help right away if you are not doing well or get worse. Document Released: 06/06/2007 Document Revised: 03/12/2011 Document Reviewed: 03/10/2010 Exit[employer] Patient Information 2014 [employer]. Bronchitis Bronchitis is swelling (inflammation) of the air tubes leading to your lungs (bronchi). This causes mucus and a cough. If the swelling gets bad, you may have trouble breathing. HOME CARE   Rest.  Drink enough fluids to keep your pee (urine) clear or pale yellow (unless you have a condition where you have to watch how much you drink).  Only take medicine as told by your doctor. If you were given antibiotic medicines, finish them even if you start to feel better.  Avoid smoke, irritating chemicals, and strong smells. These make the problem  worse. Quit smoking if you smoke. This helps your lungs heal faster.  Use a cool mist humidifier. Change the water in the humidifier every day. You can also sit in the bathroom with hot shower running for 5 10 minutes. Keep the door closed.  See your health care provider as told.  Wash your hands often. GET HELP IF: Your problems do not get better after 1 week. GET HELP RIGHT AWAY IF:   Your fever gets worse.  You have chills.  Your chest hurts.  Your problems breathing get worse.  You have blood in your mucus.  You pass out (faint).  You feel lightheaded.  You have a bad headache.  You throw up (vomit) again and again. MAKE SURE YOU:  Understand these instructions.  Will watch your condition.  Will get help right away if you are not doing well or get worse. Document Released: 06/06/2007 Document Revised: 10/08/2012 Document Reviewed: 08/12/2012 Exit[employer] Patient Information 2014 [employer].  

## 2013-03-11 NOTE — Progress Notes (Signed)
Subjective:    Patient ID: [name], female    DOB: [birth year], 63 y.o.   MRN: [id number]  HPI Comments: 63 yo female started ZPAK, expired Tessalon perles on Sunday and Proair today with out any relief. She has mild yellow production from nose and chest. Mucinex has not helped.   Cough Associated symptoms include a fever and shortness of breath.  Sore Throat  Associated symptoms include coughing and shortness of breath.  Fever  Associated symptoms include coughing.  Shortness of Breath Associated symptoms include a fever.    Current Outpatient Prescriptions on File Prior to Visit  Medication Sig Dispense Refill  . AMBULATORY NON FORMULARY MEDICATION Take 1 tablet by mouth 2 (two) times daily. Medication Name: Domperidone 10mg   120 tablet  12  . azithromycin (ZITHROMAX Z-PAK) 250 MG tablet Take as directed on pack  6 tablet  0  . Cholecalciferol (VITAMIN D) 2000 UNITS tablet Take 2,000 Units by mouth daily.      Marland Kitchen HYDROcodone-homatropine (HYCODAN) 5-1.5 MG/5ML syrup Take 5 mLs by mouth every 8 (eight) hours as needed for cough.  120 mL  0  . magnesium oxide (MAG-OX) 400 MG tablet Take 400 mg by mouth daily.      . meloxicam (MOBIC) 15 MG tablet Take 15 mg by mouth daily.      . montelukast (SINGULAIR) 10 MG tablet Take 10 mg by mouth at bedtime.      . Pantoprazole Sodium (PROTONIX PO) Take 40 mg by mouth daily.      . verapamil (COVERA HS) 180 MG (CO) 24 hr tablet Take 180 mg by mouth at bedtime.      . vitamin B-12 (CYANOCOBALAMIN) 1000 MCG tablet Take 1,000 mcg by mouth daily.       No current facility-administered medications on file prior to visit.   Allergies  Allergen Reactions  . Metoclopramide Hcl   . Penicillins   . Sulfamethoxazole-Trimethoprim   . Ciprofloxacin Palpitations   Past Medical History  Diagnosis Date  . Allergy   . Anemia   . Asthma   . Depression   . GERD (gastroesophageal reflux disease)       Review of Systems  Constitutional:  Positive for fever.  Respiratory: Positive for cough and shortness of breath.    BP 144/72  Pulse 102  Temp(Src) 100 F (37.8 C) (Oral)  Resp 18  Ht 5' 4.5" (1.638 m)  Wt 156 lb (70.761 kg)  BMI 26.37 kg/m2  SpO2 97%     Objective:   Physical Exam  Nursing note and vitals reviewed. Constitutional: She is oriented to person, place, and time. She appears well-developed and well-nourished.  HENT:  Head: Normocephalic and atraumatic.  Right Ear: External ear normal.  Left Ear: External ear normal.  Nose: Nose normal.  Mouth/Throat: Oropharynx is clear and moist. No oropharyngeal exudate.  Yellow TMs bilateral Frontal tenderness  Eyes: Conjunctivae and EOM are normal.  Neck: Normal range of motion.  Cardiovascular: Normal rate, regular rhythm, normal heart sounds and intact distal pulses.   Pulmonary/Chest: Effort normal. She has wheezes.  Musculoskeletal: Normal range of motion.  Lymphadenopathy:    She has no cervical adenopathy.  Neurological: She is alert and oriented to person, place, and time.  Skin: Skin is warm and dry.  Psychiatric: She has a normal mood and affect. Judgment normal.          Assessment & Plan:  1. Sinusitis/ Bronchitis/ SOB/ Allergic rhinitis- Allegra OTC, increase H2o,  allergy hygiene explained. Continue ZPAK, Pred DP 10 mg Both AD. Add Doxy 100 mg, Tessalon perles 200 mg, Albuterol HFA, Duoneb in office.

## 2013-03-22 ENCOUNTER — Other Ambulatory Visit: Payer: Self-pay | Admitting: Physician Assistant

## 2013-04-16 ENCOUNTER — Other Ambulatory Visit: Payer: Self-pay | Admitting: *Deleted

## 2013-04-16 MED ORDER — VERAPAMIL HCL 180 MG (CO) PO TB24: 180.0000 mg | ORAL_TABLET | Freq: Every day | ORAL | Status: DC

## 2013-05-10 DIAGNOSIS — T7840XA Allergy, unspecified, initial encounter: Secondary | ICD-10-CM | POA: Insufficient documentation

## 2013-05-10 DIAGNOSIS — J45909 Unspecified asthma, uncomplicated: Secondary | ICD-10-CM | POA: Insufficient documentation

## 2013-05-11 ENCOUNTER — Encounter: Payer: Self-pay | Admitting: Physician Assistant

## 2013-05-11 ENCOUNTER — Ambulatory Visit (INDEPENDENT_AMBULATORY_CARE_PROVIDER_SITE_OTHER): Payer: BC Managed Care – PPO | Admitting: Physician Assistant

## 2013-05-11 VITALS — BP 120/70 | HR 72 | Temp 97.9°F | Resp 16 | Ht 64.5 in | Wt 144.0 lb

## 2013-05-11 DIAGNOSIS — Z1211 Encounter for screening for malignant neoplasm of colon: Secondary | ICD-10-CM

## 2013-05-11 DIAGNOSIS — E559 Vitamin D deficiency, unspecified: Secondary | ICD-10-CM

## 2013-05-11 DIAGNOSIS — Z862 Personal history of diseases of the blood and blood-forming organs and certain disorders involving the immune mechanism: Secondary | ICD-10-CM

## 2013-05-11 DIAGNOSIS — Z79899 Other long term (current) drug therapy: Secondary | ICD-10-CM

## 2013-05-11 DIAGNOSIS — Z0000 Encounter for general adult medical examination without abnormal findings: Secondary | ICD-10-CM

## 2013-05-11 DIAGNOSIS — Z8659 Personal history of other mental and behavioral disorders: Secondary | ICD-10-CM

## 2013-05-11 DIAGNOSIS — J45909 Unspecified asthma, uncomplicated: Secondary | ICD-10-CM

## 2013-05-11 LAB — CBC WITH DIFFERENTIAL/PLATELET
BASOS PCT: 1 % (ref 0–1)
Basophils Absolute: 0.1 10*3/uL (ref 0.0–0.1)
Eosinophils Absolute: 0.1 10*3/uL (ref 0.0–0.7)
Eosinophils Relative: 1 % (ref 0–5)
HCT: 34.8 % — ABNORMAL LOW (ref 36.0–46.0)
Hemoglobin: 11.8 g/dL — ABNORMAL LOW (ref 12.0–15.0)
Lymphocytes Relative: 30 % (ref 12–46)
Lymphs Abs: 1.6 10*3/uL (ref 0.7–4.0)
MCH: 29.1 pg (ref 26.0–34.0)
MCHC: 33.9 g/dL (ref 30.0–36.0)
MCV: 85.7 fL (ref 78.0–100.0)
MONOS PCT: 14 % — AB (ref 3–12)
Monocytes Absolute: 0.8 10*3/uL (ref 0.1–1.0)
NEUTROS ABS: 2.9 10*3/uL (ref 1.7–7.7)
Neutrophils Relative %: 54 % (ref 43–77)
Platelets: 326 10*3/uL (ref 150–400)
RBC: 4.06 MIL/uL (ref 3.87–5.11)
RDW: 14.5 % (ref 11.5–15.5)
WBC: 5.4 10*3/uL (ref 4.0–10.5)

## 2013-05-11 NOTE — Patient Instructions (Signed)

## 2013-05-11 NOTE — Progress Notes (Signed)
Complete Physical HPI 63 y.o. female  presents for a complete physical. Her blood pressure has been controlled at home, today their BP is BP: 120/70 mmHg She does not workout but wants to get back into the habit of walking. She denies chest pain, shortness of breath, dizziness.  She is not on cholesterol medication and denies myalgias. Her cholesterol is at goal. The cholesterol last visit was:  LDL 103, trigs 166.  Last A1C in the office was: 5.5 Patient is on Vitamin D supplement. Vitamin D 93 She is on an [employer] program and she has lost 17 lbs.  Magnesium was 1.8.   Current Medications:  Current Outpatient Prescriptions on File Prior to Visit  Medication Sig Dispense Refill  . albuterol (PROAIR HFA) 108 (90 BASE) MCG/ACT inhaler Inhale 1-2 puffs into the lungs every 4 (four) hours as needed.  18 g  3  . AMBULATORY NON FORMULARY MEDICATION Take 1 tablet by mouth 2 (two) times daily. Medication Name: Domperidone 10mg   360 tablet  3  . Cholecalciferol (VITAMIN D) 2000 UNITS tablet Take 2,000 Units by mouth daily.      . magnesium oxide (MAG-OX) 400 MG tablet Take 400 mg by mouth daily.      . meloxicam (MOBIC) 15 MG tablet Take 15 mg by mouth daily.      . montelukast (SINGULAIR) 10 MG tablet Take 10 mg by mouth at bedtime.      . pantoprazole (PROTONIX) 40 MG tablet take 1 tablet by mouth once daily  90 tablet  0  . verapamil (COVERA HS) 180 MG (CO) 24 hr tablet Take 1 tablet (180 mg total) by mouth at bedtime.  90 tablet  1  . vitamin B-12 (CYANOCOBALAMIN) 1000 MCG tablet Take 1,000 mcg by mouth daily.       Current Facility-Administered Medications on File Prior to Visit  Medication Dose Route Frequency Provider Last Rate Last Dose  . ipratropium-albuterol (DUONEB) 0.5-2.5 (3) MG/3ML nebulizer solution 3 mL  3 mL Nebulization Once Ardis Hughs, PA-C       Health Maintenance:   Immunization History  Administered Date(s) Administered  . Influenza Split 09/19/2012  .  Pneumococcal Polysaccharide-23 05/06/2012  . Tdap 11/22/2011  . Zoster 11/22/2011    Tetanus: 2013 Pneumovax: 2014 Flu vaccine: 2014 Zostavax: 2013 Pap: 2012 MGM: 01/20/2013 DEXA: DUE this year Colonoscopy:10/2010 EGD: N/A Eye: Dr. Idolina Primer- yearly, glasses Dentist: Dr. Ennis Forts  Allergies:  Allergies  Allergen Reactions  . Metoclopramide Hcl   . Penicillins   . Sulfamethoxazole-Trimethoprim   . Ciprofloxacin Palpitations   Medical History:  Past Medical History  Diagnosis Date  . Anemia   . Depression   . GERD (gastroesophageal reflux disease)   . Allergy   . Asthma    Surgical History:  Past Surgical History  Procedure Laterality Date  . Cesarean section    . Abdominal hysterectomy    . Temporomandibular joint surgery     Family History:  Family History  Problem Relation Age of Onset  . Hypertension Mother   . Cancer Mother     Uterine and Breast  . Cerebral aneurysm Mother   . Arthritis Father   . Hypertension Father   . Glaucoma Father    Social History:  History  Substance Use Topics  . Smoking status: Never Smoker   . Smokeless tobacco: Never Used  . Alcohol Use: No    Review of Systems: [X]  = complains of  [ ]  = denies  General: Fatigue [ ]  Fever [ ]  Chills [ ]  Weakness [ ]   Insomnia [ ] Weight change [ ]  Night sweats [ ]   Change in appetite [ ]  Eyes: Redness [ ]  Blurred vision [ ]  Diplopia [ ]  Discharge [ ]   ENT: Congestion [ ]  Sinus Pain [ ]  Post Nasal Drip Valu.Nieves ] Sore Throat [ ]  Earache [ ]  hearing loss [ ]  Tinnitus [ ]  Snoring [ ]   Cardiac: Chest pain/pressure [ ]  SOB [ ]  Orthopnea [ ]   Palpitations [ ]   Paroxysmal nocturnal dyspnea[ ]  Claudication [ ]  Edema [ ]   Pulmonary: Cough [ ]  Wheezing[X ]  SOB [ ]   Pleurisy [ ]   GI: Nausea [ ]  Vomiting[ ]  Dysphagia[ ]  Heartburn[ ]  Abdominal pain [ ]  Constipation [ ] ; Diarrhea [ ]  BRBPR [ ]  Melena[ ]  Bloating [ ]  Hemorrhoids [ ]   GU: Hematuria[ ]  Dysuria [ ]  Nocturia[ ]  Urgency [ ]   Hesitancy [ ]  Discharge [  ] Frequency [ ]   Breast:  Breast lumps [ ]   nipple discharge [ ]    Neuro: Headaches[ ]  Vertigo[ ]  Paresthesias[ ]  Spasm [ ]  Speech changes [ ]  Incoordination [ ]   Ortho: Arthritis [ ]  Joint pain KNEE PAIN OCC Valu.Nieves ] Muscle pain [ ]  Joint swelling [ ]  Back Pain [ ]  Skin:  Rash [ ]   Pruritis [ ]  Change in skin lesion [ ]   Psych: Depression[ ]  Anxiety[ ]  Confusion [ ]  Memory loss [ ]   Heme/Lypmh: Bleeding [ ]  Bruising [ ]  Enlarged lymph nodes [ ]   Endocrine: Visual blurring [ ]  Paresthesia [ ]  Polyuria [ ]  Polydypsea [ ]    Heat/cold intolerance [ ]  Hypoglycemia [ ]   Physical Exam: Estimated body mass index is 24.34 kg/(m^2) as calculated from the following:   Height as of this encounter: 5' 4.5" (1.638 m).   Weight as of this encounter: 144 lb (65.318 kg). BP 120/70  Pulse 72  Temp(Src) 97.9 F (36.6 C)  Resp 16  Ht 5' 4.5" (1.638 m)  Wt 144 lb (65.318 kg)  BMI 24.34 kg/m2 General Appearance: Well nourished, in no apparent distress. Eyes: PERRLA, EOMs, conjunctiva no swelling or erythema, normal fundi and vessels. Sinuses: No Frontal/maxillary tenderness ENT/Mouth: Ext aud canals clear, normal light reflex with TMs without erythema, bulging.  Good dentition. No erythema, swelling, or exudate on post pharynx. Tonsils not swollen or erythematous. Hearing normal.  Neck: Supple, thyroid normal. No bruits Respiratory: Respiratory effort normal, BS equal bilaterally without rales, rhonchi, wheezing or stridor. Cardio: RRR without murmurs, rubs or gallops. Brisk peripheral pulses without edema.  Chest: symmetric, with normal excursions and percussion. Breasts: defer Abdomen: Soft, +BS. Non tender, no guarding, rebound, hernias, masses, or organomegaly. .  Lymphatics: Non tender without lymphadenopathy.  Genitourinary: defer Musculoskeletal: Full ROM all peripheral extremities,5/5 strength, and normal gait. Skin: Warm, dry without rashes, lesions, ecchymosis.  Neuro: Cranial nerves intact,  reflexes equal bilaterally. Normal muscle tone, no cerebellar symptoms. Sensation intact.  Psych: Awake and oriented X 3, normal affect, Insight and Judgment appropriate.   EKG: WNL no changes.  Assessment and Plan: Urinary urgency- bladder matter discussed, check for UTI, if not better with consider PT or medication Get DEXA Up to date on vaccines Anemia- check CBC  Depression- remission  GERD (gastroesophageal reflux disease)- controlled  Allergy- controlled  Asthma- controlled    Discussed med's effects and SE's. Screening labs and tests as requested with regular follow-up as recommended.   Vicie Mutters 10:04  AM

## 2013-05-12 LAB — LIPID PANEL
Cholesterol: 189 mg/dL (ref 0–200)
HDL: 51 mg/dL (ref >39)
LDL CALC: 119 mg/dL — AB (ref 0–99)
Total CHOL/HDL Ratio: 3.7 Ratio
Triglycerides: 93 mg/dL (ref <150)
VLDL: 19 mg/dL (ref 0–40)

## 2013-05-12 LAB — HEPATIC FUNCTION PANEL
ALK PHOS: 55 U/L (ref 39–117)
ALT: 12 U/L (ref 0–35)
AST: 18 U/L (ref 0–37)
Albumin: 4.2 g/dL (ref 3.5–5.2)
BILIRUBIN INDIRECT: 0.6 mg/dL (ref 0.2–1.2)
Bilirubin, Direct: 0.1 mg/dL (ref 0.0–0.3)
TOTAL PROTEIN: 6.4 g/dL (ref 6.0–8.3)
Total Bilirubin: 0.7 mg/dL (ref 0.2–1.2)

## 2013-05-12 LAB — IRON AND TIBC
%SAT: 16 % — ABNORMAL LOW (ref 20–55)
IRON: 55 ug/dL (ref 42–145)
TIBC: 353 ug/dL (ref 250–470)
UIBC: 298 ug/dL (ref 125–400)

## 2013-05-12 LAB — URINALYSIS, ROUTINE W REFLEX MICROSCOPIC
BILIRUBIN URINE: NEGATIVE
Glucose, UA: NEGATIVE mg/dL
Hgb urine dipstick: NEGATIVE
Ketones, ur: 15 mg/dL — AB
Leukocytes, UA: NEGATIVE
Nitrite: NEGATIVE
PROTEIN: NEGATIVE mg/dL
Specific Gravity, Urine: 1.014 (ref 1.005–1.030)
UROBILINOGEN UA: 0.2 mg/dL (ref 0.0–1.0)
pH: 6 (ref 5.0–8.0)

## 2013-05-12 LAB — URINE CULTURE
COLONY COUNT: NO GROWTH
Organism ID, Bacteria: NO GROWTH

## 2013-05-12 LAB — FERRITIN: Ferritin: 16 ng/mL (ref 10–291)

## 2013-05-12 LAB — BASIC METABOLIC PANEL WITH GFR
BUN: 15 mg/dL (ref 6–23)
CHLORIDE: 97 meq/L (ref 96–112)
CO2: 25 mEq/L (ref 19–32)
Calcium: 9.2 mg/dL (ref 8.4–10.5)
Creat: 0.99 mg/dL (ref 0.50–1.10)
GFR, Est African American: 71 mL/min
GFR, Est Non African American: 61 mL/min
GLUCOSE: 79 mg/dL (ref 70–99)
Potassium: 4.1 mEq/L (ref 3.5–5.3)
Sodium: 135 mEq/L (ref 135–145)

## 2013-05-12 LAB — TSH: TSH: 1.121 u[IU]/mL (ref 0.350–4.500)

## 2013-05-12 LAB — MAGNESIUM: Magnesium: 1.8 mg/dL (ref 1.5–2.5)

## 2013-05-12 LAB — HEMOGLOBIN A1C
Hgb A1c MFr Bld: 5.8 % — ABNORMAL HIGH (ref <5.7)
MEAN PLASMA GLUCOSE: 120 mg/dL — AB (ref <117)

## 2013-05-12 LAB — VITAMIN D 25 HYDROXY (VIT D DEFICIENCY, FRACTURES): Vit D, 25-Hydroxy: 72 ng/mL (ref 30–89)

## 2013-05-12 LAB — MICROALBUMIN / CREATININE URINE RATIO
Creatinine, Urine: 172.2 mg/dL
Microalb Creat Ratio: 2.9 mg/g (ref 0.0–30.0)
Microalb, Ur: 0.5 mg/dL (ref 0.00–1.89)

## 2013-05-12 LAB — VITAMIN B12: Vitamin B-12: 1130 pg/mL — ABNORMAL HIGH (ref 211–911)

## 2013-05-12 LAB — INSULIN, FASTING: Insulin fasting, serum: 6 u[IU]/mL (ref 3–28)

## 2013-06-15 ENCOUNTER — Other Ambulatory Visit: Payer: Self-pay | Admitting: Internal Medicine

## 2013-06-29 ENCOUNTER — Other Ambulatory Visit: Payer: Self-pay | Admitting: Internal Medicine

## 2013-07-15 ENCOUNTER — Other Ambulatory Visit: Payer: Self-pay | Admitting: Internal Medicine

## 2013-08-05 ENCOUNTER — Other Ambulatory Visit: Payer: Self-pay | Admitting: Dermatology

## 2013-08-17 ENCOUNTER — Other Ambulatory Visit: Payer: Self-pay | Admitting: Obstetrics & Gynecology

## 2013-08-18 LAB — CYTOLOGY - PAP

## 2013-09-02 ENCOUNTER — Other Ambulatory Visit: Payer: Self-pay | Admitting: *Deleted

## 2013-09-03 ENCOUNTER — Other Ambulatory Visit: Payer: Self-pay | Admitting: *Deleted

## 2013-09-03 MED ORDER — TIOTROPIUM BROMIDE MONOHYDRATE 18 MCG IN CAPS: 18.0000 ug | ORAL_CAPSULE | Freq: Every day | RESPIRATORY_TRACT | Status: DC

## 2013-09-17 ENCOUNTER — Telehealth: Payer: Self-pay | Admitting: *Deleted

## 2013-09-17 NOTE — Telephone Encounter (Signed)
Patient called and left detailed message with the front staff.  I received a written message stating patient c/o ear fullness with mild pain.  Per Dr. Idell Pickles orders I called patient and advised her to try OTC Sudafed- PE.  Advised if symptoms do not improve she will need an OV for further evaluation.

## 2013-09-28 ENCOUNTER — Other Ambulatory Visit: Payer: Self-pay | Admitting: Internal Medicine

## 2013-10-02 ENCOUNTER — Other Ambulatory Visit: Payer: Self-pay | Admitting: Internal Medicine

## 2013-10-02 MED ORDER — PANTOPRAZOLE SODIUM 40 MG PO TBEC: DELAYED_RELEASE_TABLET | ORAL | Status: DC

## 2013-10-17 ENCOUNTER — Ambulatory Visit (INDEPENDENT_AMBULATORY_CARE_PROVIDER_SITE_OTHER): Payer: BC Managed Care – PPO | Admitting: Family Medicine

## 2013-10-17 VITALS — BP 122/76 | HR 68 | Temp 98.3°F | Resp 16 | Ht 64.5 in | Wt 144.0 lb

## 2013-10-17 DIAGNOSIS — J302 Other seasonal allergic rhinitis: Secondary | ICD-10-CM

## 2013-10-17 DIAGNOSIS — H6981 Other specified disorders of Eustachian tube, right ear: Secondary | ICD-10-CM

## 2013-10-17 MED ORDER — PREDNISONE 20 MG PO TABS: ORAL_TABLET | ORAL | Status: DC

## 2013-10-17 MED ORDER — FLUTICASONE PROPIONATE 50 MCG/ACT NA SUSP: 2.0000 | Freq: Every day | NASAL | Status: DC

## 2013-10-17 NOTE — Progress Notes (Signed)
Subjective:    Patient ID: [name], female    DOB: [birth year], 63 y.o.   MRN: [id number]  Otalgia  Associated symptoms include headaches. Pertinent negatives include no coughing, ear discharge, rhinorrhea or sore throat.   Chief Complaint  Patient presents with  . Otalgia    Right sided, more pressure than pain   This chart was scribed for Wardell Honour, MD, MD by Thea Alken, ED Scribe. This patient was seen in room 4 and the patient's care was started at 11:31 AM.  HPI Comments: [name] is a 63 y.o. female who presents to the [facility] complaining of right otalgia described as pressure that began several weeks ago. Pt reports discomfort and pressure to ear lobe and behind ear. She reports HA but states she has HA often. Pt takes zyrtec daily for allergies. Pt denies fever, chills, hearing loss, tinnitus, sore throat,  ear discharge, dizziness, and cough. Pt reports hx asthma and takes singulair. Pt reports hx bronchitis about 7 months ago. Pt denies osteopenia and osteoporosis.  Pt is leaving for [place] in 2 days for 19 days. Pt has received flu shot this year.   Past Medical History  Diagnosis Date  . Anemia   . Depression   . GERD (gastroesophageal reflux disease)   . Allergy   . Asthma   . Palpitations     Cardiolite neg 2005, echo 2011 EF 55%   Past Surgical History  Procedure Laterality Date  . Cesarean section    . Abdominal hysterectomy    . Temporomandibular joint surgery    . Varicose vein surgery    . Bunionectomy    . Pyloric stenosis      at 70 weeks old   Prior to Admission medications   Medication Sig Start Date End Date Taking? Authorizing Provider  albuterol (PROAIR HFA) 108 (90 BASE) MCG/ACT inhaler Inhale 1-2 puffs into the lungs every 4 (four) hours as needed. 03/11/13  Yes Melissa R Keagen Heinlen, PA-C  AMBULATORY NON FORMULARY MEDICATION Take 1 tablet by mouth 2 (two) times daily. Medication Name: Domperidone 10mg  03/11/13   Yes Melissa R Brelan Hannen, PA-C  Cholecalciferol (VITAMIN D) 2000 UNITS tablet Take 2,000 Units by mouth daily.   Yes Historical Provider, MD  Cinnamon 500 MG capsule Take 500 mg by mouth daily.   Yes Historical Provider, MD  Flaxseed, Linseed, (FLAX SEED OIL PO) Take by mouth.   Yes Historical Provider, MD  lansoprazole (PREVACID 24HR) 15 MG capsule Take 15 mg by mouth daily at 12 noon.   Yes Historical Provider, MD  magnesium oxide (MAG-OX) 400 MG tablet Take 400 mg by mouth daily.   Yes Historical Provider, MD  meloxicam (MOBIC) 15 MG tablet Take 15 mg by mouth daily.   Yes Historical Provider, MD  montelukast (SINGULAIR) 10 MG tablet take 1 tablet by mouth once daily 07/15/13  Yes Unk Pinto, MD  OVER THE COUNTER MEDICATION FORMULA 1   Yes Historical Provider, MD  tiotropium (SPIRIVA) 18 MCG inhalation capsule Place 1 capsule (18 mcg total) into inhaler and inhale daily. 09/03/13  Yes Vicie Mutters, PA-C  verapamil (CALAN-SR) 180 MG CR tablet TAKE 1 TABLET BY MOUTH AT BEDTIME 06/15/13  Yes Melissa R Meegan Shanafelt, PA-C  vitamin B-12 (CYANOCOBALAMIN) 1000 MCG tablet Take 1,000 mcg by mouth daily.   Yes Historical Provider, MD    Review of Systems  Constitutional: Negative for fever and chills.  HENT: Positive for ear pain.  Negative for congestion, ear discharge, rhinorrhea and sore throat.   Respiratory: Negative for cough.   Neurological: Positive for headaches. Negative for dizziness and light-headedness.   Objective:   Physical Exam  Nursing note and vitals reviewed. Constitutional: She is oriented to person, place, and time. She appears well-developed and well-nourished. No distress.  HENT:  Head: Normocephalic and atraumatic.  Right Ear: Tympanic membrane is retracted ( slightly).  Left Ear: External ear normal. Tympanic membrane is retracted ( slightly).  Mouth/Throat: Oropharynx is clear and moist. No oropharyngeal exudate, posterior oropharyngeal edema, posterior oropharyngeal erythema or  tonsillar abscesses.  Mild cerumen bilaterally with good visualization of TM's.  Eyes: Conjunctivae and EOM are normal. Pupils are equal, round, and reactive to light.  Neck: Neck supple. No thyromegaly present.  Cardiovascular: Normal rate, regular rhythm and normal heart sounds.   Pulmonary/Chest: Effort normal and breath sounds normal. No respiratory distress. She has no wheezes. She has no rales.  Musculoskeletal: Normal range of motion.  Lymphadenopathy:    She has no cervical adenopathy.  Neurological: She is alert and oriented to person, place, and time.  Skin: Skin is warm and dry. No rash noted.  Psychiatric: She has a normal mood and affect. Her behavior is normal.   Assessment & Plan:   1. Eustachian tube dysfunction, right   2. Other seasonal allergic rhinitis     1. R Eustachian tube dysfunction: New.  Continue Zyrtec daily; add Flonase for one month; rx for Prednisone due to upcoming travel to [place] with flight. 2.  Allergic Rhinitis: moderately controlled; continue Zyrtec; add Flonase.     Meds ordered this encounter  Medications  . lansoprazole (PREVACID 24HR) 15 MG capsule    Sig: Take 15 mg by mouth daily at 12 noon.  . Flaxseed, Linseed, (FLAX SEED OIL PO)    Sig: Take by mouth.  . Cinnamon 500 MG capsule    Sig: Take 500 mg by mouth daily.  . fluticasone (FLONASE) 50 MCG/ACT nasal spray    Sig: Place 2 sprays into both nostrils daily.    Dispense:  16 g    Refill:  6  . predniSONE (DELTASONE) 20 MG tablet    Sig: Two tablets daily x 4 days then one tablet daily x 4 days    Dispense:  12 tablet    Refill:  0    I personally performed the services described in this documentation, which was scribed in my presence.  The recorded information has been reviewed and is accurate.  Reginia Forts, M.D.  [facility] [address] [place], [place]  [address] [phone] phone [phone] fax

## 2013-11-11 ENCOUNTER — Encounter: Payer: Self-pay | Admitting: Physician Assistant

## 2013-11-11 ENCOUNTER — Ambulatory Visit (INDEPENDENT_AMBULATORY_CARE_PROVIDER_SITE_OTHER): Payer: BC Managed Care – PPO | Admitting: Physician Assistant

## 2013-11-11 VITALS — BP 120/70 | HR 76 | Temp 98.1°F | Resp 16 | Ht 64.5 in | Wt 148.0 lb

## 2013-11-11 DIAGNOSIS — E782 Mixed hyperlipidemia: Secondary | ICD-10-CM

## 2013-11-11 DIAGNOSIS — R7309 Other abnormal glucose: Secondary | ICD-10-CM

## 2013-11-11 DIAGNOSIS — Z79899 Other long term (current) drug therapy: Secondary | ICD-10-CM

## 2013-11-11 DIAGNOSIS — K2100 Gastro-esophageal reflux disease with esophagitis, without bleeding: Secondary | ICD-10-CM

## 2013-11-11 DIAGNOSIS — R490 Dysphonia: Secondary | ICD-10-CM

## 2013-11-11 DIAGNOSIS — R7303 Prediabetes: Secondary | ICD-10-CM | POA: Insufficient documentation

## 2013-11-11 LAB — CBC WITH DIFFERENTIAL/PLATELET
Basophils Absolute: 0 10*3/uL (ref 0.0–0.1)
Basophils Relative: 0 % (ref 0–1)
Eosinophils Absolute: 0.1 10*3/uL (ref 0.0–0.7)
Eosinophils Relative: 2 % (ref 0–5)
HCT: 37.2 % (ref 36.0–46.0)
Hemoglobin: 12.3 g/dL (ref 12.0–15.0)
LYMPHS ABS: 1.9 10*3/uL (ref 0.7–4.0)
Lymphocytes Relative: 33 % (ref 12–46)
MCH: 29.2 pg (ref 26.0–34.0)
MCHC: 33.1 g/dL (ref 30.0–36.0)
MCV: 88.4 fL (ref 78.0–100.0)
MONOS PCT: 13 % — AB (ref 3–12)
Monocytes Absolute: 0.7 10*3/uL (ref 0.1–1.0)
NEUTROS PCT: 52 % (ref 43–77)
Neutro Abs: 3 10*3/uL (ref 1.7–7.7)
PLATELETS: 354 10*3/uL (ref 150–400)
RBC: 4.21 MIL/uL (ref 3.87–5.11)
RDW: 13.9 % (ref 11.5–15.5)
WBC: 5.7 10*3/uL (ref 4.0–10.5)

## 2013-11-11 LAB — LIPID PANEL
CHOL/HDL RATIO: 3.8 ratio
Cholesterol: 234 mg/dL — ABNORMAL HIGH (ref 0–200)
HDL: 61 mg/dL (ref >39)
LDL CALC: 151 mg/dL — AB (ref 0–99)
TRIGLYCERIDES: 109 mg/dL (ref <150)
VLDL: 22 mg/dL (ref 0–40)

## 2013-11-11 LAB — BASIC METABOLIC PANEL WITH GFR
BUN: 14 mg/dL (ref 6–23)
CO2: 29 meq/L (ref 19–32)
Calcium: 9.3 mg/dL (ref 8.4–10.5)
Chloride: 99 mEq/L (ref 96–112)
Creat: 1.07 mg/dL (ref 0.50–1.10)
GFR, Est African American: 64 mL/min
GFR, Est Non African American: 55 mL/min — ABNORMAL LOW
Glucose, Bld: 85 mg/dL (ref 70–99)
POTASSIUM: 4.4 meq/L (ref 3.5–5.3)
SODIUM: 136 meq/L (ref 135–145)

## 2013-11-11 LAB — HEMOGLOBIN A1C
Hgb A1c MFr Bld: 5.5 % (ref <5.7)
MEAN PLASMA GLUCOSE: 111 mg/dL (ref <117)

## 2013-11-11 LAB — HEPATIC FUNCTION PANEL
ALT: 12 U/L (ref 0–35)
AST: 16 U/L (ref 0–37)
Albumin: 4 g/dL (ref 3.5–5.2)
Alkaline Phosphatase: 78 U/L (ref 39–117)
BILIRUBIN INDIRECT: 0.6 mg/dL (ref 0.2–1.2)
BILIRUBIN TOTAL: 0.7 mg/dL (ref 0.2–1.2)
Bilirubin, Direct: 0.1 mg/dL (ref 0.0–0.3)
Total Protein: 6.4 g/dL (ref 6.0–8.3)

## 2013-11-11 LAB — MAGNESIUM: Magnesium: 1.9 mg/dL (ref 1.5–2.5)

## 2013-11-11 LAB — TSH: TSH: 1.708 u[IU]/mL (ref 0.350–4.500)

## 2013-11-11 MED ORDER — PANTOPRAZOLE SODIUM 40 MG PO TBEC: 40.0000 mg | DELAYED_RELEASE_TABLET | Freq: Every day | ORAL | Status: DC

## 2013-11-11 MED ORDER — LANSOPRAZOLE 15 MG PO CPDR: 15.0000 mg | DELAYED_RELEASE_CAPSULE | Freq: Every day | ORAL | Status: DC

## 2013-11-11 NOTE — Progress Notes (Signed)
Assessment and Plan:  Hypertension: Continue medication, monitor blood pressure at home. Continue DASH diet.  Reminder to go to the ER if any CP, SOB, nausea, dizziness, severe HA, changes vision/speech, left arm numbness and tingling, and jaw pain. Cholesterol: Continue diet and exercise. Check cholesterol.  Pre-diabetes-Continue diet and exercise. Check A1C Vitamin D Def- check level and continue medications.  Hoarseness- ? Allergies versus GERD- do nasal spray and protonix.   Continue diet and meds as discussed. Further disposition pending results of labs.  HPI 63 y.o. female  presents for 3 month follow up with hypertension, hyperlipidemia, prediabetes and vitamin D. Her blood pressure has been controlled at home, today their BP is BP: 120/70 mmHg She does workout. She denies chest pain, shortness of breath, dizziness.  She is not on cholesterol medication and denies myalgias. Her cholesterol is at goal. The cholesterol last visit was:   Lab Results  Component Value Date   CHOL 189 05/11/2013   HDL 51 05/11/2013   LDLCALC 119* 05/11/2013   TRIG 93 05/11/2013   CHOLHDL 3.7 05/11/2013   She has been working on diet and exercise for prediabetes, and denies polydipsia, polyuria and visual disturbances. Last A1C in the office was:  Lab Results  Component Value Date   HGBA1C 5.8* 05/11/2013   Patient is on Vitamin D supplement.   Lab Results  Component Value Date   VD25OH 72 05/11/2013     She just got back from a 3 week river cruise, in [place], [place], and [place]. While on the trip she had 2 episodes of BRBPR on the TP, not gross blood. She has a history of hemorrhoids. She is on benefiber and normally has loose stools.  Last colonoscopy was 10/2010 with Dr. Earlean Shawl.  Increasing hoarseness over the last 2 months, allergies have been very bad and she has ran out of her protonix  Current Medications:  Current Outpatient Prescriptions on File Prior to Visit  Medication Sig  Dispense Refill  . albuterol (PROAIR HFA) 108 (90 BASE) MCG/ACT inhaler Inhale 1-2 puffs into the lungs every 4 (four) hours as needed. 18 g 3  . AMBULATORY NON FORMULARY MEDICATION Take 1 tablet by mouth 2 (two) times daily. Medication Name: Domperidone 10mg  360 tablet 3  . Cholecalciferol (VITAMIN D) 2000 UNITS tablet Take 2,000 Units by mouth daily.    . Cinnamon 500 MG capsule Take 500 mg by mouth daily.    . Flaxseed, Linseed, (FLAX SEED OIL PO) Take by mouth.    . fluticasone (FLONASE) 50 MCG/ACT nasal spray Place 2 sprays into both nostrils daily. 16 g 6  . lansoprazole (PREVACID 24HR) 15 MG capsule Take 15 mg by mouth daily at 12 noon.    . magnesium oxide (MAG-OX) 400 MG tablet Take 400 mg by mouth daily.    . meloxicam (MOBIC) 15 MG tablet Take 15 mg by mouth daily.    . montelukast (SINGULAIR) 10 MG tablet take 1 tablet by mouth once daily 90 tablet 2  . OVER THE COUNTER MEDICATION FORMULA 1    . tiotropium (SPIRIVA) 18 MCG inhalation capsule Place 1 capsule (18 mcg total) into inhaler and inhale daily. 30 capsule 3  . verapamil (CALAN-SR) 180 MG CR tablet TAKE 1 TABLET BY MOUTH AT BEDTIME 90 tablet 1  . vitamin B-12 (CYANOCOBALAMIN) 1000 MCG tablet Take 1,000 mcg by mouth daily.     Current Facility-Administered Medications on File Prior to Visit  Medication Dose Route Frequency Provider Last Rate Last  Dose  . ipratropium-albuterol (DUONEB) 0.5-2.5 (3) MG/3ML nebulizer solution 3 mL  3 mL Nebulization Once Ardis Hughs, PA-C       Medical History:  Past Medical History  Diagnosis Date  . Anemia   . Depression   . GERD (gastroesophageal reflux disease)   . Allergy   . Asthma   . Palpitations     Cardiolite neg 2005, echo 2011 EF 55%   Allergies:  Allergies  Allergen Reactions  . Metoclopramide Hcl   . Penicillins   . Shellfish Allergy   . Sulfamethoxazole-Trimethoprim   . Ciprofloxacin Palpitations     Review of Systems: [X]  = complains of  [ ]  =  denies  General: Fatigue [ ]  Fever [ ]  Chills [ ]  Weakness [ ]   Insomnia [ ]  Eyes: Redness [ ]  Blurred vision [ ]  Diplopia [ ]   ENT: Congestion [ ]  Sinus Pain [ ]  Post Nasal Drip [ x] Sore Throat [ ]  Earache [ ]   Cardiac: Chest pain/pressure [ ]  SOB [ ]  Orthopnea [ ]   Palpitations [ ]   Paroxysmal nocturnal dyspnea[ ]  Claudication [ ]  Edema [ ]   Pulmonary: Cough [ ]  Wheezing[ ]   SOB [ ]   Snoring [ ]   GI: Nausea [ ]  Vomiting[ ]  Dysphagia[ ]  Heartburn[x ] Abdominal pain [ ]  Constipation [ ] ; Diarrhea [ ] ; BRBPR [ ]  Melena[ ]  GU: Hematuria[ ]  Dysuria [ ]  Nocturia[ ]  Urgency [ ]   Hesitancy [ ]  Discharge [ ]  Neuro: Headaches[ ]  Vertigo[ ]  Paresthesias[ ]  Spasm [ ]  Speech changes [ ]  Incoordination [ ]   Ortho: Arthritis [ ]  Joint pain [ ]  Muscle pain [ ]  Joint swelling [ ]  Back Pain [ ]  Skin:  Rash [ ]   Pruritis [ ]  Change in skin lesion [ ]   Psych: Depression[ ]  Anxiety[ ]  Confusion [ ]  Memory loss [ ]   Heme/Lypmh: Bleeding [ ]  Bruising [ ]  Enlarged lymph nodes [ ]   Endocrine: Visual blurring [ ]  Paresthesia [ ]  Polyuria [ ]  Polydypsea [ ]    Heat/cold intolerance [ ]  Hypoglycemia [ ]   Family history- Review and unchanged Social history- Review and unchanged Physical Exam: BP 120/70 mmHg  Pulse 76  Temp(Src) 98.1 F (36.7 C)  Resp 16  Ht 5' 4.5" (1.638 m)  Wt 148 lb (67.132 kg)  BMI 25.02 kg/m2 Wt Readings from Last 3 Encounters:  11/11/13 148 lb (67.132 kg)  10/17/13 144 lb (65.318 kg)  05/11/13 144 lb (65.318 kg)   General Appearance: Well nourished, in no apparent distress. Eyes: PERRLA, EOMs, conjunctiva no swelling or erythema Sinuses: No Frontal/maxillary tenderness ENT/Mouth: Ext aud canals clear, TMs without erythema, bulging. No erythema, swelling, or exudate on post pharynx.  Tonsils not swollen or erythematous. Hearing normal.  Neck: Supple, thyroid normal.  Respiratory: Respiratory effort normal, BS equal bilaterally without rales, rhonchi, wheezing or stridor.  Cardio:  RRR with no MRGs. Brisk peripheral pulses without edema.  Abdomen: Soft, + BS.  Mild epigastric tender, no guarding, rebound, hernias, masses. Lymphatics: Non tender without lymphadenopathy.  Musculoskeletal: Full ROM, 5/5 strength, normal gait.  Skin: Warm, dry without rashes, lesions, ecchymosis.  Neuro: Cranial nerves intact. Normal muscle tone, no cerebellar symptoms. Sensation intact.  Psych: Awake and oriented X 3, normal affect, Insight and Judgment appropriate.    Vicie Mutters, PA-C 9:58 AM [facility]

## 2013-11-11 NOTE — Patient Instructions (Signed)

## 2014-01-01 DIAGNOSIS — R002 Palpitations: Secondary | ICD-10-CM

## 2014-01-01 HISTORY — DX: Palpitations: R00.2

## 2014-03-10 ENCOUNTER — Encounter: Payer: Self-pay | Admitting: *Deleted

## 2014-03-18 ENCOUNTER — Other Ambulatory Visit: Payer: Self-pay | Admitting: *Deleted

## 2014-03-18 MED ORDER — VERAPAMIL HCL ER 180 MG PO TBCR: 180.0000 mg | EXTENDED_RELEASE_TABLET | Freq: Every day | ORAL | Status: DC

## 2014-03-27 ENCOUNTER — Other Ambulatory Visit: Payer: Self-pay | Admitting: Internal Medicine

## 2014-03-29 ENCOUNTER — Encounter: Payer: Self-pay | Admitting: Internal Medicine

## 2014-03-29 ENCOUNTER — Ambulatory Visit (INDEPENDENT_AMBULATORY_CARE_PROVIDER_SITE_OTHER): Payer: BC Managed Care – PPO | Admitting: Internal Medicine

## 2014-03-29 VITALS — BP 132/72 | HR 76 | Temp 97.7°F | Resp 16 | Ht 64.0 in | Wt 152.0 lb

## 2014-03-29 DIAGNOSIS — J309 Allergic rhinitis, unspecified: Secondary | ICD-10-CM

## 2014-03-29 DIAGNOSIS — H6981 Other specified disorders of Eustachian tube, right ear: Secondary | ICD-10-CM

## 2014-03-29 MED ORDER — ALBUTEROL SULFATE HFA 108 (90 BASE) MCG/ACT IN AERS: 1.0000 | INHALATION_SPRAY | RESPIRATORY_TRACT | Status: AC | PRN

## 2014-03-29 MED ORDER — FLUTICASONE PROPIONATE 50 MCG/ACT NA SUSP: 2.0000 | Freq: Every day | NASAL | Status: DC

## 2014-03-29 MED ORDER — PREDNISONE 20 MG PO TABS: ORAL_TABLET | ORAL | Status: DC

## 2014-03-29 NOTE — Patient Instructions (Signed)

## 2014-03-29 NOTE — Progress Notes (Signed)
   Subjective:    Patient ID: [name], female    DOB: October 29, 1950, 64 y.o.   MRN: [id number]  Cough Associated symptoms include chills, ear pain, postnasal drip, a sore throat, shortness of breath and wheezing. Pertinent negatives include no chest pain, fever or rhinorrhea. Her past medical history is significant for environmental allergies.  Patient presents to the office for evaluation of cough and congestion since Thursday.  She reports that the cough has been mostly dry and wakes her up at night.  She reports some wheezing and tightness but not too terrible.  She reports that her congestion is socked in.  She can breath but it feels tight.  She also some hoarsness.  She reports that her throat is itchy.  She was outside with her grandson on Thursday and Friday morning.  She has been doing hot showers, trying to stay inside.  She has been taking zyrtec, singulair, spiriva, and mucinex.  She reports no nasal saline or netti pots.  She reports no albuterol use.      Review of Systems  Constitutional: Positive for chills and fatigue. Negative for fever.  HENT: Positive for congestion, ear pain, postnasal drip, sinus pressure and sore throat. Negative for rhinorrhea and tinnitus.   Respiratory: Positive for cough, chest tightness, shortness of breath and wheezing.   Cardiovascular: Negative for chest pain, palpitations and leg swelling.  Gastrointestinal: Negative for nausea and vomiting.  Allergic/Immunologic: Positive for environmental allergies.       Objective:   Physical Exam  Constitutional: She is oriented to person, place, and time. She appears well-developed and well-nourished. No distress.  HENT:  Head: Normocephalic and atraumatic.  Nose: Mucosal edema present. Right sinus exhibits no maxillary sinus tenderness and no frontal sinus tenderness. Left sinus exhibits no maxillary sinus tenderness and no frontal sinus tenderness.  Mouth/Throat: Uvula is midline and mucous  membranes are normal. No trismus in the jaw. Posterior oropharyngeal edema present. No oropharyngeal exudate or posterior oropharyngeal erythema.  Eyes: Conjunctivae and EOM are normal. Pupils are equal, round, and reactive to light. No scleral icterus.  Neck: Normal range of motion. Neck supple.  Cardiovascular: Normal rate, regular rhythm, normal heart sounds and intact distal pulses.  Exam reveals no gallop and no friction rub.   No murmur heard. Pulmonary/Chest: Effort normal and breath sounds normal. No respiratory distress. She has no wheezes. She has no rales. She exhibits no tenderness.  Musculoskeletal: Normal range of motion.  Neurological: She is alert and oriented to person, place, and time.  Skin: Skin is warm and dry. She is not diaphoretic.  Psychiatric: She has a normal mood and affect. Her behavior is normal. Judgment and thought content normal.  Nursing note and vitals reviewed.         Assessment & Plan:    1. Allergic rhinitis, unspecified allergic rhinitis type -given history, vitals, and PE suspect that this is viral URI vs. Allergic rhinitis.  Try prednisone, flonase, and netti pot/saline.  If no improvement then zpak.    2. Eustachian tube dysfunction, right  - fluticasone (FLONASE) 50 MCG/ACT nasal spray; Place 2 sprays into both nostrils daily.  Dispense: 16 g; Refill: 6

## 2014-05-12 ENCOUNTER — Encounter: Payer: Self-pay | Admitting: Physician Assistant

## 2014-06-17 ENCOUNTER — Encounter: Payer: Self-pay | Admitting: Physician Assistant

## 2014-07-06 ENCOUNTER — Other Ambulatory Visit: Payer: Self-pay | Admitting: Physician Assistant

## 2014-08-17 ENCOUNTER — Ambulatory Visit (INDEPENDENT_AMBULATORY_CARE_PROVIDER_SITE_OTHER): Payer: BC Managed Care – PPO | Admitting: Physician Assistant

## 2014-08-17 ENCOUNTER — Other Ambulatory Visit: Payer: Self-pay | Admitting: Physician Assistant

## 2014-08-17 ENCOUNTER — Encounter: Payer: Self-pay | Admitting: Physician Assistant

## 2014-08-17 VITALS — BP 142/70 | HR 68 | Temp 98.1°F | Resp 16 | Ht 64.0 in | Wt 155.4 lb

## 2014-08-17 DIAGNOSIS — K3184 Gastroparesis: Secondary | ICD-10-CM

## 2014-08-17 DIAGNOSIS — K2100 Gastro-esophageal reflux disease with esophagitis, without bleeding: Secondary | ICD-10-CM

## 2014-08-17 DIAGNOSIS — F325 Major depressive disorder, single episode, in full remission: Secondary | ICD-10-CM

## 2014-08-17 DIAGNOSIS — E559 Vitamin D deficiency, unspecified: Secondary | ICD-10-CM

## 2014-08-17 DIAGNOSIS — T7840XD Allergy, unspecified, subsequent encounter: Secondary | ICD-10-CM

## 2014-08-17 DIAGNOSIS — Z79899 Other long term (current) drug therapy: Secondary | ICD-10-CM | POA: Diagnosis not present

## 2014-08-17 DIAGNOSIS — Z0001 Encounter for general adult medical examination with abnormal findings: Secondary | ICD-10-CM

## 2014-08-17 DIAGNOSIS — I6529 Occlusion and stenosis of unspecified carotid artery: Secondary | ICD-10-CM

## 2014-08-17 DIAGNOSIS — I10 Essential (primary) hypertension: Secondary | ICD-10-CM

## 2014-08-17 DIAGNOSIS — J45909 Unspecified asthma, uncomplicated: Secondary | ICD-10-CM

## 2014-08-17 DIAGNOSIS — K5730 Diverticulosis of large intestine without perforation or abscess without bleeding: Secondary | ICD-10-CM

## 2014-08-17 DIAGNOSIS — R7303 Prediabetes: Secondary | ICD-10-CM

## 2014-08-17 DIAGNOSIS — E782 Mixed hyperlipidemia: Secondary | ICD-10-CM

## 2014-08-17 DIAGNOSIS — Z1159 Encounter for screening for other viral diseases: Secondary | ICD-10-CM

## 2014-08-17 DIAGNOSIS — R899 Unspecified abnormal finding in specimens from other organs, systems and tissues: Secondary | ICD-10-CM

## 2014-08-17 DIAGNOSIS — R519 Headache, unspecified: Secondary | ICD-10-CM

## 2014-08-17 DIAGNOSIS — R002 Palpitations: Secondary | ICD-10-CM

## 2014-08-17 DIAGNOSIS — Z0000 Encounter for general adult medical examination without abnormal findings: Secondary | ICD-10-CM

## 2014-08-17 DIAGNOSIS — R51 Headache: Secondary | ICD-10-CM

## 2014-08-17 DIAGNOSIS — Z862 Personal history of diseases of the blood and blood-forming organs and certain disorders involving the immune mechanism: Secondary | ICD-10-CM

## 2014-08-17 MED ORDER — TIOTROPIUM BROMIDE MONOHYDRATE 18 MCG IN CAPS: ORAL_CAPSULE | RESPIRATORY_TRACT | Status: DC

## 2014-08-17 MED ORDER — PREDNISONE 20 MG PO TABS: ORAL_TABLET | ORAL | Status: DC

## 2014-08-17 NOTE — Progress Notes (Signed)
Complete Physical  Assessment and Plan: 1. Mixed hyperlipidemia -continue medications, check lipids, decrease fatty foods, increase activity.  - Lipid panel  2. Prediabetes Discussed general issues about diabetes pathophysiology and management., Educational material distributed., Suggested low cholesterol diet., Encouraged aerobic exercise., Discussed foot care., Reminded to get yearly retinal exam. - Hemoglobin A1c - Insulin, fasting  3. Carotid artery stenosis, unspecified laterality Control blood pressure, cholesterol, glucose, increase exercise.   4. Depression, major, in remission Going through separation, declines medications at this time, encouraged counseling, no SI/HI  5. ANEMIA, HX OF - Iron and TIBC - Ferritin - Vitamin B12  6. Vitamin D deficiency - Vit D  25 hydroxy (rtn osteoporosis monitoring)  7. Medication management - Magnesium  8. Allergy, subsequent encounter Stay on allergy pills and singulair  9. Nonintractable headache, unspecified chronicity pattern, unspecified headache type Controlled, avoid triggers  10. Asthma, unspecified asthma severity, uncomplicated Add aeroinhaler for 1 month, take prednisone pills - tiotropium (SPIRIVA HANDIHALER) 18 MCG inhalation capsule; inhale the contents of one capsule in the handihaler once daily  Dispense: 30 capsule; Refill: 5 - predniSONE (DELTASONE) 20 MG tablet; 2 tablets daily for 3 days, 1 tablet daily for 4 days.  Dispense: 10 tablet; Refill: 0  11. Gastroesophageal reflux disease with esophagitis Add zantac  12. Gastroparesis Add zantac  13. Diverticulosis of large intestine without hemorrhage Up to date, increase fiber  14. Essential hypertension Labile, monitor BP at home - CBC with Differential/Platelet - BASIC METABOLIC PANEL WITH GFR - Hepatic function panel - TSH - Urinalysis, Routine w reflex microscopic (not at [facility]) - Microalbumin / creatinine urine ratio - EKG 12-Lead  15. Screening  for viral disease - Hepatitis C antibody - HIV antibody  16. Encounter for general adult medical examination with abnormal findings - CBC with Differential/Platelet - BASIC METABOLIC PANEL WITH GFR - Hepatic function panel - TSH - Lipid panel - Hemoglobin A1c - Insulin, fasting - Magnesium - Vit D  25 hydroxy (rtn osteoporosis monitoring) - Urinalysis, Routine w reflex microscopic (not at [facility]) - Microalbumin / creatinine urine ratio - Iron and TIBC - Ferritin - Vitamin B12 - Hepatitis C antibody - HIV antibody  17. Palpitations/dizziness Check labs, control asthma, get on zantac, will increase verapamil to 240 Follow up 1 month   Discussed med's effects and SE's. Screening labs and tests as requested with regular follow-up as recommended.  HPI 64 y.o. female  presents for a complete physical. Her blood pressure has been controlled at home, has wrist cuff at home and normally it is okay, today their BP is BP: (!) 142/70 mmHg She does not workout but wants to get back into the habit of walking. She denies chest pain, dizziness.  She has been having asthma issues since may, she was cleaning out her church and went to the beach, has had coughing at times productive, did better with spiriva at night and albuterol q 4 hours. Has had some heavier breathing with walking up hills, worse with the heat. Denies CP, dizziness. She will have palpitations, dizziness, and hot flashes in the morning. Will eat a few peanut butter crackers, will sit in bed and will feel better after 30 mins. Has not had any for the last 1 weeks, was happened 1-2 x a week.  Her and her husband separated in Jan and has been under a lot of stress. She sleeps okay, has been eating too much.   Will occ take meloxicam for OA but has not  taken for 2-3 weeks.  She is not on the protonix or dopirodone any more, per Dr. Earlean Shawl suggestions.  She is not on cholesterol medication and denies myalgias. Her cholesterol is at  goal. The cholesterol last visit was:  Lab Results  Component Value Date   CHOL 234* 11/11/2013   HDL 61 11/11/2013   LDLCALC 151* 11/11/2013   TRIG 109 11/11/2013   CHOLHDL 3.8 11/11/2013   Last A1C in the office was:  Lab Results  Component Value Date   HGBA1C 5.5 11/11/2013   Patient is on Vitamin D supplement.  Lab Results  Component Value Date   VD25OH 72 05/11/2013   BMI is Body mass index is 26.66 kg/(m^2)., she is working on diet and exercise. Wt Readings from Last 3 Encounters:  08/17/14 155 lb 6.4 oz (70.489 kg)  03/29/14 152 lb (68.947 kg)  11/11/13 148 lb (67.132 kg)   She is on the estradiol patch from Dr. Nori Riis for 1 year.  Had neg hemoccult with Dr. Earlean Shawl this year.   Current Medications:  Current Outpatient Prescriptions on File Prior to Visit  Medication Sig Dispense Refill  . albuterol (PROAIR HFA) 108 (90 BASE) MCG/ACT inhaler Inhale 1-2 puffs into the lungs every 4 (four) hours as needed. 18 g 3  . AMBULATORY NON FORMULARY MEDICATION Take 1 tablet by mouth 2 (two) times daily. Medication Name: Domperidone 10mg  360 tablet 3  . Cholecalciferol (VITAMIN D) 2000 UNITS tablet Take 2,000 Units by mouth daily.    . Cinnamon 500 MG capsule Take 500 mg by mouth daily.    . Coenzyme Q10 (COQ10 PO) Take by mouth daily.    Marland Kitchen estradiol (MINIVELLE) 0.05 MG/24HR patch Place 1 patch onto the skin 2 (two) times a week.    . Flaxseed, Linseed, (FLAX SEED OIL PO) Take by mouth.    . fluticasone (FLONASE) 50 MCG/ACT nasal spray Place 2 sprays into both nostrils daily. 16 g 6  . HYDROCHLOROTHIAZIDE PO Take by mouth daily as needed.    . Linaclotide (LINZESS PO) Take by mouth daily.    . magnesium oxide (MAG-OX) 400 MG tablet Take 400 mg by mouth daily.    . meloxicam (MOBIC) 15 MG tablet Take 15 mg by mouth daily.    . montelukast (SINGULAIR) 10 MG tablet take 1 tablet by mouth once daily 90 tablet 3  . OVER THE COUNTER MEDICATION FORMULA 1    . pantoprazole (PROTONIX) 40  MG tablet Take 1 tablet (40 mg total) by mouth daily. 90 tablet 1  . predniSONE (DELTASONE) 20 MG tablet 3 tabs po day one, then 2 tabs daily x 4 days 11 tablet 0  . Probiotic Product (PROBIOTIC DAILY PO) Take by mouth daily.    Marland Kitchen SPIRIVA HANDIHALER 18 MCG inhalation capsule inhale the contents of one capsule in the handihaler once daily 30 capsule 1  . verapamil (CALAN-SR) 180 MG CR tablet Take 1 tablet (180 mg total) by mouth at bedtime. 90 tablet 1  . vitamin B-12 (CYANOCOBALAMIN) 1000 MCG tablet Take 1,000 mcg by mouth daily.     Current Facility-Administered Medications on File Prior to Visit  Medication Dose Route Frequency Provider Last Rate Last Dose  . ipratropium-albuterol (DUONEB) 0.5-2.5 (3) MG/3ML nebulizer solution 3 mL  3 mL Nebulization Once Kelby Aline, PA-C       Health Maintenance:   Immunization History  Administered Date(s) Administered  . Influenza Split 09/19/2012, 10/01/2013  . Pneumococcal Conjugate-13 06/29/2013  . Pneumococcal  Polysaccharide-23 05/06/2012  . Tdap 11/22/2011  . Zoster 11/22/2011   Tetanus: 2013 Pneumovax: 2014 Prevnar 13: 2015 Flu vaccine: 2014 Zostavax: 2013 Pap: 08/2013, Dr. Nori Riis, negative, scheduled next week MGM: 01/2014 DEXA: At OB/GYN normal, 2015 Colonoscopy: 10/2010, due next year EGD: N/A Echo 2011, EF 55-50% MRI brain 2003 Eye: Dr. Idolina Primer- yearly, glasses Dentist: Dr. Ennis Forts  Allergies:  Allergies  Allergen Reactions  . Metoclopramide Hcl   . Penicillins   . Shellfish Allergy   . Sulfamethoxazole-Trimethoprim   . Ciprofloxacin Palpitations   Medical History:  Past Medical History  Diagnosis Date  . Anemia   . Depression   . GERD (gastroesophageal reflux disease)   . Allergy   . Asthma   . Palpitations     Cardiolite neg 2005, echo 2011 EF 55%   Surgical History:  Past Surgical History  Procedure Laterality Date  . Cesarean section    . Abdominal hysterectomy    . Temporomandibular joint surgery    .  Varicose vein surgery    . Bunionectomy    . Pyloric stenosis      at 16 weeks old   Family History:  Family History  Problem Relation Age of Onset  . Hypertension Mother   . Cancer Mother     Uterine and Breast  . Cerebral aneurysm Mother   . Arthritis Father   . Hypertension Father   . Glaucoma Father    Social History:  Social History  Substance Use Topics  . Smoking status: Never Smoker   . Smokeless tobacco: Never Used  . Alcohol Use: No   Review of Systems  Constitutional: Positive for malaise/fatigue. Negative for fever, chills, weight loss and diaphoresis.  HENT: Negative.   Eyes: Negative.  Negative for blurred vision and double vision.  Respiratory: Positive for cough, sputum production and shortness of breath. Negative for hemoptysis and wheezing.   Cardiovascular: Positive for palpitations. Negative for chest pain, orthopnea, claudication, leg swelling and PND.  Gastrointestinal: Positive for blood in stool (fissure, better with cream from Dr. Earlean Shawl). Negative for heartburn, nausea, vomiting, abdominal pain, diarrhea, constipation and melena.  Genitourinary: Negative.   Musculoskeletal: Positive for joint pain (right knee). Negative for myalgias, back pain, falls and neck pain.  Skin: Negative.   Neurological: Positive for dizziness. Negative for tingling, tremors, sensory change, speech change, focal weakness, seizures, loss of consciousness and weakness.  Psychiatric/Behavioral: Negative for depression, suicidal ideas and memory loss. The patient is not nervous/anxious and does not have insomnia.      Physical Exam: Estimated body mass index is 26.66 kg/(m^2) as calculated from the following:   Height as of 03/29/14: 5\' 4"  (1.626 m).   Weight as of this encounter: 155 lb 6.4 oz (70.489 kg). BP 142/70 mmHg  Pulse 68  Temp(Src) 98.1 F (36.7 C)  Resp 16  Wt 155 lb 6.4 oz (70.489 kg) General Appearance: Well nourished, in no apparent distress. Eyes: PERRLA,  EOMs, conjunctiva no swelling or erythema, normal fundi and vessels. Sinuses: No Frontal/maxillary tenderness ENT/Mouth: Ext aud canals clear, normal light reflex with TMs without erythema, bulging.  Good dentition. No erythema, swelling, or exudate on post pharynx. Tonsils not swollen or erythematous. Hearing normal.  Neck: Supple, thyroid normal. No bruits Respiratory: Respiratory effort normal, decreased breath sounds without rales, rhonchi, wheezing or stridor. Cardio: RRR without murmurs, rubs or gallops. Brisk peripheral pulses without edema.  Chest: symmetric, with normal excursions and percussion. Breasts: defer Abdomen: Soft, +BS. Non tender, no guarding,  rebound, hernias, masses, or organomegaly. .  Lymphatics: Non tender without lymphadenopathy.  Genitourinary: defer Musculoskeletal: Full ROM all peripheral extremities,5/5 strength, and normal gait. Skin: Warm, dry without rashes, lesions, ecchymosis.  Neuro: Cranial nerves intact, reflexes equal bilaterally. Normal muscle tone, no cerebellar symptoms. Sensation intact.  Psych: Awake and oriented X 3, normal affect, Insight and Judgment appropriate.   EKG: WNL no changes.   Vicie Mutters 2:06 PM

## 2014-08-17 NOTE — Patient Instructions (Addendum)
Take the sample inhaler with the spiriva for 1 month, can do albuterol PRN Will give prednisone pills, take if you need it only for wheezing/SOB Try yogurt snack before bed  If continue to have the issues in the morning, we may be able to increase the verapamil to high dose from 180-->240 at night. Call if not better/continues.   GETTING OFF OF PPI's    Nexium/protonix/prilosec/Omeprazole/Dexilant/Aciphex are called PPI's, they are great at healing your stomach but should only be taken for a short period of time.     Recent studies have shown that taken for a long time they  can increase the risk of osteoporosis (weakening of your bones), pneumonia, low magnesium, restless legs, Cdiff (infection that causes diarrhea), DEMENTIA and most recently kidney damage / disease / insufficiency.     Due to this information we want to try to stop the PPI but if you try to stop it abruptly this can cause rebound acid and worsening symptoms.   So this is how we want you to get off the PPI:  - Start taking the nexium/protonix/prilosec/PPI  every other day with  zantac (ranitidine) 2 x a day for 2-4 weeks  - then decrease the PPI to every 3 days while taking the zantac (ranitidine) twice a day the other  days for 2-4  Weeks  - then you can try the zantac (ranitidine) once at night or up to 2 x day as needed.  - you can continue on this once at night or stop all together  - Avoid alcohol, spicy foods, NSAIDS (aleve, ibuprofen) at this time. See foods below.   +++++++++++++++++++++++++++++++++++++++++++  Food Choices for Gastroesophageal Reflux Disease  When you have gastroesophageal reflux disease (GERD), the foods you eat and your eating habits are very important. Choosing the right foods can help ease the discomfort of GERD. WHAT GENERAL GUIDELINES DO I NEED TO FOLLOW?  Choose fruits, vegetables, whole grains, low-fat dairy products, and low-fat meat, fish, and poultry.  Limit fats such as oils,  salad dressings, butter, nuts, and avocado.  Keep a food diary to identify foods that cause symptoms.  Avoid foods that cause reflux. These may be different for different people.  Eat frequent small meals instead of three large meals each day.  Eat your meals slowly, in a relaxed setting.  Limit fried foods.  Cook foods using methods other than frying.  Avoid drinking alcohol.  Avoid drinking large amounts of liquids with your meals.  Avoid bending over or lying down until 2-3 hours after eating.   WHAT FOODS ARE NOT RECOMMENDED? The following are some foods and drinks that may worsen your symptoms:  Vegetables Tomatoes. Tomato juice. Tomato and spaghetti sauce. Chili peppers. Onion and garlic. Horseradish. Fruits Oranges, grapefruit, and lemon (fruit and juice). Meats High-fat meats, fish, and poultry. This includes hot dogs, ribs, ham, sausage, salami, and bacon. Dairy Whole milk and chocolate milk. Sour cream. Cream. Butter. Ice cream. Cream cheese.  Beverages Coffee and tea, with or without caffeine. Carbonated beverages or energy drinks. Condiments Hot sauce. Barbecue sauce.  Sweets/Desserts Chocolate and cocoa. Donuts. Peppermint and spearmint. Fats and Oils High-fat foods, including [place] fries and potato chips. Other Vinegar. Strong spices, such as black pepper, white pepper, red pepper, cayenne, curry powder, cloves, ginger, and chili powder. Nexium/protonix/prilosec are called PPI's, they are great at healing your stomach but should only be taken for a short period of time.   Preventive Care for Adults  A healthy  lifestyle and preventive care can promote health and wellness. Preventive health guidelines for women include the following key practices.  A routine yearly physical is a good way to check with your health care provider about your health and preventive screening. It is a chance to share any concerns and updates on your health and to receive a  thorough exam.  Visit your dentist for a routine exam and preventive care every 6 months. Brush your teeth twice a day and floss once a day. Good oral hygiene prevents tooth decay and gum disease.  The frequency of eye exams is based on your age, health, family medical history, use of contact lenses, and other factors. Follow your health care provider's recommendations for frequency of eye exams.  Eat a healthy diet. Foods like vegetables, fruits, whole grains, low-fat dairy products, and lean protein foods contain the nutrients you need without too many calories. Decrease your intake of foods high in solid fats, added sugars, and salt. Eat the right amount of calories for you.Get information about a proper diet from your health care provider, if necessary.  Regular physical exercise is one of the most important things you can do for your health. Most adults should get at least 150 minutes of moderate-intensity exercise (any activity that increases your heart rate and causes you to sweat) each week. In addition, most adults need muscle-strengthening exercises on 2 or more days a week.  Maintain a healthy weight. The body mass index (BMI) is a screening tool to identify possible weight problems. It provides an estimate of body fat based on height and weight. Your health care provider can find your BMI and can help you achieve or maintain a healthy weight.For adults 20 years and older:  A BMI below 18.5 is considered underweight.  A BMI of 18.5 to 24.9 is normal.  A BMI of 25 to 29.9 is considered overweight.  A BMI of 30 and above is considered obese.  Maintain normal blood lipids and cholesterol levels by exercising and minimizing your intake of saturated fat. Eat a balanced diet with plenty of fruit and vegetables. Blood tests for lipids and cholesterol should begin at age 67 and be repeated every 5 years. If your lipid or cholesterol levels are high, you are over 50, or you are at high risk  for heart disease, you may need your cholesterol levels checked more frequently.Ongoing high lipid and cholesterol levels should be treated with medicines if diet and exercise are not working.  If you smoke, find out from your health care provider how to quit. If you do not use tobacco, do not start.  Lung cancer screening is recommended for adults aged 3-80 years who are at high risk for developing lung cancer because of a history of smoking. A yearly low-dose CT scan of the lungs is recommended for people who have at least a 30-pack-year history of smoking and are a current smoker or have quit within the past 15 years. A pack year of smoking is smoking an average of 1 pack of cigarettes a day for 1 year (for example: 1 pack a day for 30 years or 2 packs a day for 15 years). Yearly screening should continue until the smoker has stopped smoking for at least 15 years. Yearly screening should be stopped for people who develop a health problem that would prevent them from having lung cancer treatment.  High blood pressure causes heart disease and increases the risk of stroke. Your blood pressure should be  checked at least every 1 to 2 years. Ongoing high blood pressure should be treated with medicines if weight loss and exercise do not work.  If you are 46-50 years old, ask your health care provider if you should take aspirin to prevent strokes.  Diabetes screening involves taking a blood sample to check your fasting blood sugar level. This should be done once every 3 years, after age 12, if you are within normal weight and without risk factors for diabetes. Testing should be considered at a younger age or be carried out more frequently if you are overweight and have at least 1 risk factor for diabetes.  Breast cancer screening is essential preventive care for women. You should practice "breast self-awareness." This means understanding the normal appearance and feel of your breasts and may include breast  self-examination. Any changes detected, no matter how small, should be reported to a health care provider. Women in their 106s and 30s should have a clinical breast exam (CBE) by a health care provider as part of a regular health exam every 1 to 3 years. After age 37, women should have a CBE every year. Starting at age 35, women should consider having a mammogram (breast X-ray test) every year. Women who have a family history of breast cancer should talk to their health care provider about genetic screening. Women at a high risk of breast cancer should talk to their health care providers about having an MRI and a mammogram every year.  Breast cancer gene (BRCA)-related cancer risk assessment is recommended for women who have family members with BRCA-related cancers. BRCA-related cancers include breast, ovarian, tubal, and peritoneal cancers. Having family members with these cancers may be associated with an increased risk for harmful changes (mutations) in the breast cancer genes BRCA1 and BRCA2. Results of the assessment will determine the need for genetic counseling and BRCA1 and BRCA2 testing.  Routine pelvic exams to screen for cancer are no longer recommended for nonpregnant women who are considered low risk for cancer of the pelvic organs (ovaries, uterus, and vagina) and who do not have symptoms. Ask your health care provider if a screening pelvic exam is right for you.  If you have had past treatment for cervical cancer or a condition that could lead to cancer, you need Pap tests and screening for cancer for at least 20 years after your treatment. If Pap tests have been discontinued, your risk factors (such as having a new sexual partner) need to be reassessed to determine if screening should be resumed. Some women have medical problems that increase the chance of getting cervical cancer. In these cases, your health care provider may recommend more frequent screening and Pap tests.  Colorectal cancer  can be detected and often prevented. Most routine colorectal cancer screening begins at the age of 72 years and continues through age 50 years. However, your health care provider may recommend screening at an earlier age if you have risk factors for colon cancer. On a yearly basis, your health care provider may provide home test kits to check for hidden blood in the stool. Use of a small camera at the end of a tube, to directly examine the colon (sigmoidoscopy or colonoscopy), can detect the earliest forms of colorectal cancer. Talk to your health care provider about this at age 68, when routine screening begins. Direct exam of the colon should be repeated every 5-10 years through age 70 years, unless early forms of pre-cancerous polyps or small growths are found.  Hepatitis C blood testing is recommended for all people born from [birth year] through 1965 and any individual with known risks for hepatitis C.  Pra  Osteoporosis is a disease in which the bones lose minerals and strength with aging. This can result in serious bone fractures or breaks. The risk of osteoporosis can be identified using a bone density scan. Women ages 32 years and over and women at risk for fractures or osteoporosis should discuss screening with their health care providers. Ask your health care provider whether you should take a calcium supplement or vitamin D to reduce the rate of osteoporosis.  Menopause can be associated with physical symptoms and risks. Hormone replacement therapy is available to decrease symptoms and risks. You should talk to your health care provider about whether hormone replacement therapy is right for you.  Use sunscreen. Apply sunscreen liberally and repeatedly throughout the day. You should seek shade when your shadow is shorter than you. Protect yourself by wearing long sleeves, pants, a wide-brimmed hat, and sunglasses year round, whenever you are outdoors.  Once a month, do a whole body skin exam, using a  mirror to look at the skin on your back. Tell your health care provider of new moles, moles that have irregular borders, moles that are larger than a pencil eraser, or moles that have changed in shape or color.  Stay current with required vaccines (immunizations).  Influenza vaccine. All adults should be immunized every year.  Tetanus, diphtheria, and acellular pertussis (Td, Tdap) vaccine. Pregnant women should receive 1 dose of Tdap vaccine during each pregnancy. The dose should be obtained regardless of the length of time since the last dose. Immunization is preferred during the 27th-36th week of gestation. An adult who has not previously received Tdap or who does not know her vaccine status should receive 1 dose of Tdap. This initial dose should be followed by tetanus and diphtheria toxoids (Td) booster doses every 10 years. Adults with an unknown or incomplete history of completing a 3-dose immunization series with Td-containing vaccines should begin or complete a primary immunization series including a Tdap dose. Adults should receive a Td booster every 10 years.  Varicella vaccine. An adult without evidence of immunity to varicella should receive 2 doses or a second dose if she has previously received 1 dose. Pregnant females who do not have evidence of immunity should receive the first dose after pregnancy. This first dose should be obtained before leaving the health care facility. The second dose should be obtained 4-8 weeks after the first dose.  Human papillomavirus (HPV) vaccine. Females aged 13-26 years who have not received the vaccine previously should obtain the 3-dose series. The vaccine is not recommended for use in pregnant females. However, pregnancy testing is not needed before receiving a dose. If a female is found to be pregnant after receiving a dose, no treatment is needed. In that case, the remaining doses should be delayed until after the pregnancy. Immunization is recommended  for any person with an immunocompromised condition through the age of 69 years if she did not get any or all doses earlier. During the 3-dose series, the second dose should be obtained 4-8 weeks after the first dose. The third dose should be obtained 24 weeks after the first dose and 16 weeks after the second dose.  Zoster vaccine. One dose is recommended for adults aged 15 years or older unless certain conditions are present.  Measles, mumps, and rubella (MMR) vaccine. Adults born before [birth year] generally  are considered immune to measles and mumps. Adults born in [birth year] or later should have 1 or more doses of MMR vaccine unless there is a contraindication to the vaccine or there is laboratory evidence of immunity to each of the three diseases. A routine second dose of MMR vaccine should be obtained at least 28 days after the first dose for students attending postsecondary schools, health care workers, or international travelers. People who received inactivated measles vaccine or an unknown type of measles vaccine during 1963-1967 should receive 2 doses of MMR vaccine. People who received inactivated mumps vaccine or an unknown type of mumps vaccine before 1979 and are at high risk for mumps infection should consider immunization with 2 doses of MMR vaccine. For females of childbearing age, rubella immunity should be determined. If there is no evidence of immunity, females who are not pregnant should be vaccinated. If there is no evidence of immunity, females who are pregnant should delay immunization until after pregnancy. Unvaccinated health care workers born before [birth year] who lack laboratory evidence of measles, mumps, or rubella immunity or laboratory confirmation of disease should consider measles and mumps immunization with 2 doses of MMR vaccine or rubella immunization with 1 dose of MMR vaccine.  Pneumococcal 13-valent conjugate (PCV13) vaccine. When indicated, a person who is uncertain of her immunization  history and has no record of immunization should receive the PCV13 vaccine. An adult aged 71 years or older who has certain medical conditions and has not been previously immunized should receive 1 dose of PCV13 vaccine. This PCV13 should be followed with a dose of pneumococcal polysaccharide (PPSV23) vaccine. The PPSV23 vaccine dose should be obtained at least 8 weeks after the dose of PCV13 vaccine. An adult aged 52 years or older who has certain medical conditions and previously received 1 or more doses of PPSV23 vaccine should receive 1 dose of PCV13. The PCV13 vaccine dose should be obtained 1 or more years after the last PPSV23 vaccine dose.    Pneumococcal polysaccharide (PPSV23) vaccine. When PCV13 is also indicated, PCV13 should be obtained first. All adults aged 31 years and older should be immunized. An adult younger than age 44 years who has certain medical conditions should be immunized. Any person who resides in a nursing home or long-term care facility should be immunized. An adult smoker should be immunized. People with an immunocompromised condition and certain other conditions should receive both PCV13 and PPSV23 vaccines. People with human immunodeficiency virus (HIV) infection should be immunized as soon as possible after diagnosis. Immunization during chemotherapy or radiation therapy should be avoided. Routine use of PPSV23 vaccine is not recommended for [place] Indians, [place]atives, or people younger than 65 years unless there are medical conditions that require PPSV23 vaccine. When indicated, people who have unknown immunization and have no record of immunization should receive PPSV23 vaccine. One-time revaccination 5 years after the first dose of PPSV23 is recommended for people aged 19-64 years who have chronic kidney failure, nephrotic syndrome, asplenia, or immunocompromised conditions. People who received 1-2 doses of PPSV23 before age 14 years should receive another dose of  PPSV23 vaccine at age 49 years or later if at least 5 years have passed since the previous dose. Doses of PPSV23 are not needed for people immunized with PPSV23 at or after age 69 years.  Preventive Services / Frequency   Ages 43 to 70 years  Blood pressure check.  Lipid and cholesterol check.  Lung cancer screening. / Every year if you  are aged 78-80 years and have a 30-pack-year history of smoking and currently smoke or have quit within the past 15 years. Yearly screening is stopped once you have quit smoking for at least 15 years or develop a health problem that would prevent you from having lung cancer treatment.  Clinical breast exam.** / Every year after age 38 years.  BRCA-related cancer risk assessment.** / For women who have family members with a BRCA-related cancer (breast, ovarian, tubal, or peritoneal cancers).  Mammogram.** / Every year beginning at age 23 years and continuing for as long as you are in good health. Consult with your health care provider.  Pap test.** / Every 3 years starting at age 20 years through age 63 or 26 years with a history of 3 consecutive normal Pap tests.  HPV screening.** / Every 3 years from ages 19 years through ages 51 to 30 years with a history of 3 consecutive normal Pap tests.  Fecal occult blood test (FOBT) of stool. / Every year beginning at age 51 years and continuing until age 51 years. You may not need to do this test if you get a colonoscopy every 10 years.  Flexible sigmoidoscopy or colonoscopy.** / Every 5 years for a flexible sigmoidoscopy or every 10 years for a colonoscopy beginning at age 60 years and continuing until age 88 years.  Hepatitis C blood test.** / For all people born from [birth year] through 1965 and any individual with known risks for hepatitis C.  Skin self-exam. / Monthly.  Influenza vaccine. / Every year.  Tetanus, diphtheria, and acellular pertussis (Tdap/Td) vaccine.** / Consult your health care provider.  Pregnant women should receive 1 dose of Tdap vaccine during each pregnancy. 1 dose of Td every 10 years.  Varicella vaccine.** / Consult your health care provider. Pregnant females who do not have evidence of immunity should receive the first dose after pregnancy.  Zoster vaccine.** / 1 dose for adults aged 42 years or older.  Pneumococcal 13-valent conjugate (PCV13) vaccine.** / Consult your health care provider.  Pneumococcal polysaccharide (PPSV23) vaccine.** / 1 to 2 doses if you smoke cigarettes or if you have certain conditions.  Meningococcal vaccine.** / Consult your health care provider.  Hepatitis A vaccine.** / Consult your health care provider.  Hepatitis B vaccine.** / Consult your health care provider. Screening for abdominal aortic aneurysm (AAA)  by ultrasound is recommended for people over 50 who have history of high blood pressure or who are current or former smokers.

## 2014-08-18 LAB — CBC WITH DIFFERENTIAL/PLATELET
BASOS ABS: 0.1 10*3/uL (ref 0.0–0.1)
Basophils Relative: 1 % (ref 0–1)
Eosinophils Absolute: 0.1 10*3/uL (ref 0.0–0.7)
Eosinophils Relative: 1 % (ref 0–5)
HEMATOCRIT: 35.2 % — AB (ref 36.0–46.0)
Hemoglobin: 11.4 g/dL — ABNORMAL LOW (ref 12.0–15.0)
LYMPHS PCT: 32 % (ref 12–46)
Lymphs Abs: 1.8 10*3/uL (ref 0.7–4.0)
MCH: 28.5 pg (ref 26.0–34.0)
MCHC: 32.4 g/dL (ref 30.0–36.0)
MCV: 88 fL (ref 78.0–100.0)
MPV: 9.2 fL (ref 8.6–12.4)
Monocytes Absolute: 0.7 10*3/uL (ref 0.1–1.0)
Monocytes Relative: 13 % — ABNORMAL HIGH (ref 3–12)
NEUTROS ABS: 2.9 10*3/uL (ref 1.7–7.7)
NEUTROS PCT: 53 % (ref 43–77)
Platelets: 297 10*3/uL (ref 150–400)
RBC: 4 MIL/uL (ref 3.87–5.11)
RDW: 13.5 % (ref 11.5–15.5)
WBC: 5.5 10*3/uL (ref 4.0–10.5)

## 2014-08-18 LAB — URINALYSIS, ROUTINE W REFLEX MICROSCOPIC
BILIRUBIN URINE: NEGATIVE
GLUCOSE, UA: NEGATIVE
KETONES UR: NEGATIVE
Nitrite: NEGATIVE
PROTEIN: NEGATIVE
Specific Gravity, Urine: 1.007 (ref 1.001–1.035)
pH: 7.5 (ref 5.0–8.0)

## 2014-08-18 LAB — HEPATIC FUNCTION PANEL
ALBUMIN: 4.1 g/dL (ref 3.6–5.1)
ALT: 14 U/L (ref 6–29)
AST: 18 U/L (ref 10–35)
Alkaline Phosphatase: 82 U/L (ref 33–130)
Bilirubin, Direct: 0.1 mg/dL (ref <=0.2)
Indirect Bilirubin: 0.5 mg/dL (ref 0.2–1.2)
TOTAL PROTEIN: 6.2 g/dL (ref 6.1–8.1)
Total Bilirubin: 0.6 mg/dL (ref 0.2–1.2)

## 2014-08-18 LAB — LIPID PANEL
CHOLESTEROL: 215 mg/dL — AB (ref 125–200)
HDL: 47 mg/dL (ref >=46)
LDL Cholesterol: 133 mg/dL — ABNORMAL HIGH (ref <130)
Total CHOL/HDL Ratio: 4.6 Ratio (ref <=5.0)
Triglycerides: 176 mg/dL — ABNORMAL HIGH (ref <150)
VLDL: 35 mg/dL — ABNORMAL HIGH (ref <30)

## 2014-08-18 LAB — BASIC METABOLIC PANEL WITH GFR
BUN: 17 mg/dL (ref 7–25)
CO2: 29 mmol/L (ref 20–31)
Calcium: 8.7 mg/dL (ref 8.6–10.4)
Chloride: 98 mmol/L (ref 98–110)
Creat: 1.4 mg/dL — ABNORMAL HIGH (ref 0.50–0.99)
GFR, EST AFRICAN AMERICAN: 46 mL/min — AB (ref >=60)
GFR, EST NON AFRICAN AMERICAN: 40 mL/min — AB (ref >=60)
Glucose, Bld: 82 mg/dL (ref 65–99)
POTASSIUM: 4.1 mmol/L (ref 3.5–5.3)
Sodium: 134 mmol/L — ABNORMAL LOW (ref 135–146)

## 2014-08-18 LAB — IRON AND TIBC
%SAT: 24 % (ref 20–55)
IRON: 81 ug/dL (ref 42–145)
TIBC: 341 ug/dL (ref 250–470)
UIBC: 260 ug/dL (ref 125–400)

## 2014-08-18 LAB — URINALYSIS, MICROSCOPIC ONLY
CRYSTALS: NONE SEEN [HPF]
Casts: NONE SEEN [LPF]
RBC / HPF: NONE SEEN RBC/HPF (ref <=2)
WBC UA: NONE SEEN WBC/HPF (ref <=5)
YEAST: NONE SEEN [HPF]

## 2014-08-18 LAB — VITAMIN B12: VITAMIN B 12: 1978 pg/mL — AB (ref 211–911)

## 2014-08-18 LAB — MAGNESIUM: Magnesium: 2.1 mg/dL (ref 1.5–2.5)

## 2014-08-18 LAB — TSH: TSH: 1.073 u[IU]/mL (ref 0.350–4.500)

## 2014-08-18 LAB — INSULIN, FASTING: INSULIN FASTING, SERUM: 4.6 u[IU]/mL (ref 2.0–19.6)

## 2014-08-18 LAB — HEPATITIS C ANTIBODY: HCV Ab: NEGATIVE

## 2014-08-18 LAB — HEMOGLOBIN A1C
HEMOGLOBIN A1C: 5.5 % (ref <5.7)
MEAN PLASMA GLUCOSE: 111 mg/dL (ref <117)

## 2014-08-18 LAB — MICROALBUMIN / CREATININE URINE RATIO: Creatinine, Urine: 59.1 mg/dL

## 2014-08-18 LAB — FERRITIN: FERRITIN: 21 ng/mL (ref 10–291)

## 2014-08-18 LAB — VITAMIN D 25 HYDROXY (VIT D DEFICIENCY, FRACTURES): VIT D 25 HYDROXY: 62 ng/mL (ref 30–100)

## 2014-08-18 LAB — HIV ANTIBODY (ROUTINE TESTING W REFLEX): HIV: NONREACTIVE

## 2014-08-18 NOTE — Addendum Note (Signed)
Addended by: Vicie Mutters R on: 08/18/2014 03:08 PM   Modules accepted: Orders, SmartSet

## 2014-08-19 ENCOUNTER — Other Ambulatory Visit: Payer: Self-pay | Admitting: Obstetrics & Gynecology

## 2014-08-19 LAB — URINE CULTURE: Colony Count: >=100000

## 2014-08-20 LAB — CYTOLOGY - PAP

## 2014-09-17 ENCOUNTER — Other Ambulatory Visit: Payer: Self-pay | Admitting: Internal Medicine

## 2014-09-17 ENCOUNTER — Ambulatory Visit: Payer: Self-pay | Admitting: Physician Assistant

## 2014-09-20 ENCOUNTER — Encounter: Payer: Self-pay | Admitting: Physician Assistant

## 2014-09-20 ENCOUNTER — Ambulatory Visit (INDEPENDENT_AMBULATORY_CARE_PROVIDER_SITE_OTHER): Payer: BC Managed Care – PPO | Admitting: Physician Assistant

## 2014-09-20 VITALS — BP 108/66 | HR 72 | Temp 98.3°F | Resp 16 | Ht 64.0 in | Wt 156.6 lb

## 2014-09-20 DIAGNOSIS — R899 Unspecified abnormal finding in specimens from other organs, systems and tissues: Secondary | ICD-10-CM | POA: Diagnosis not present

## 2014-09-20 DIAGNOSIS — R350 Frequency of micturition: Secondary | ICD-10-CM | POA: Diagnosis not present

## 2014-09-20 MED ORDER — FLUCONAZOLE 150 MG PO TABS: 150.0000 mg | ORAL_TABLET | Freq: Every day | ORAL | Status: DC

## 2014-09-20 NOTE — Patient Instructions (Signed)

## 2014-09-20 NOTE — Progress Notes (Signed)
Assessment and Plan: 1. Urinary frequency - Urinalysis, Routine w reflex microscopic (not at [facility]) - Urine culture  2. Abnormal laboratory test - BASIC METABOLIC PANEL WITH GFR  3. Palpitations Resolved, call the office if any changes/returns.    Future Appointments Date Time Provider [place]  02/25/2015 10:00 AM Vicie Mutters, PA-C [facility] None  08/17/2015 2:00 PM Vicie Mutters, PA-C [facility] None    HPI 64 y.o.female presents for follow up for palpitations. She had CPE 08/17/2014, at that time complained of palpitations with dizziness and hot flashes in the morning for 3 weeks. She was put on zantac, and told to eat low protein snack at night. She had some anemia, and low sodium and worsening of her kidney function from 14/1.07--> 17/1.40. She also had blood and WBC count in the urine, she went to see her OB/GYN and they have her macrobid, she now feels she has a yeast infection.   Past Medical History  Diagnosis Date  . Anemia   . Depression   . GERD (gastroesophageal reflux disease)   . Allergy   . Asthma   . Palpitations     Cardiolite neg 2005, echo 2011 EF 55%     Allergies  Allergen Reactions  . Metoclopramide Hcl   . Penicillins   . Shellfish Allergy   . Sulfamethoxazole-Trimethoprim   . Ciprofloxacin Palpitations      Current Outpatient Prescriptions on File Prior to Visit  Medication Sig Dispense Refill  . albuterol (PROAIR HFA) 108 (90 BASE) MCG/ACT inhaler Inhale 1-2 puffs into the lungs every 4 (four) hours as needed. 18 g 3  . Cholecalciferol (VITAMIN D) 2000 UNITS tablet Take 2,000 Units by mouth daily.    . Cinnamon 500 MG capsule Take 500 mg by mouth daily.    . Coenzyme Q10 (COQ10 PO) Take by mouth daily.    Marland Kitchen estradiol (MINIVELLE) 0.05 MG/24HR patch Place 1 patch onto the skin 2 (two) times a week.    . Flaxseed, Linseed, (FLAX SEED OIL PO) Take by mouth.    . fluticasone (FLONASE) 50 MCG/ACT nasal spray Place 2 sprays into both  nostrils daily. 16 g 6  . HYDROCHLOROTHIAZIDE PO Take by mouth daily as needed.    . Linaclotide (LINZESS PO) Take by mouth daily.    . magnesium oxide (MAG-OX) 400 MG tablet Take 400 mg by mouth daily.    . meloxicam (MOBIC) 15 MG tablet Take 15 mg by mouth daily.    . montelukast (SINGULAIR) 10 MG tablet take 1 tablet by mouth once daily 90 tablet 3  . tiotropium (SPIRIVA HANDIHALER) 18 MCG inhalation capsule inhale the contents of one capsule in the handihaler once daily 30 capsule 5  . verapamil (CALAN-SR) 180 MG CR tablet take 1 tablet by mouth once daily at bedtime 90 tablet 1  . vitamin B-12 (CYANOCOBALAMIN) 1000 MCG tablet Take 1,000 mcg by mouth daily.     Current Facility-Administered Medications on File Prior to Visit  Medication Dose Route Frequency Provider Last Rate Last Dose  . ipratropium-albuterol (DUONEB) 0.5-2.5 (3) MG/3ML nebulizer solution 3 mL  3 mL Nebulization Once Melissa Smith, PA-C        ROS: all negative except above.   Physical Exam: Filed Weights   09/20/14 1112  Weight: 156 lb 9.6 oz (71.033 kg)   BP 108/66 mmHg  Pulse 72  Temp(Src) 98.3 F (36.8 C)  Resp 16  Ht 5\' 4"  (1.626 m)  Wt 156 lb 9.6 oz (71.033  kg)  BMI 26.87 kg/m2 General Appearance: Well nourished, in no apparent distress. Eyes: PERRLA, EOMs, conjunctiva no swelling or erythema Sinuses: No Frontal/maxillary tenderness ENT/Mouth: Ext aud canals clear, TMs without erythema, bulging. No erythema, swelling, or exudate on post pharynx.  Tonsils not swollen or erythematous. Hearing normal.  Neck: Supple, thyroid normal.  Respiratory: Respiratory effort normal, BS equal bilaterally without rales, rhonchi, wheezing or stridor.  Cardio: RRR with no MRGs. Brisk peripheral pulses without edema.  Abdomen: Soft, + BS.  Non tender, no guarding, rebound, hernias, masses. Lymphatics: Non tender without lymphadenopathy.  Musculoskeletal: Full ROM, 5/5 strength, normal gait.  Skin: Warm, dry without  rashes, lesions, ecchymosis.  Neuro: Cranial nerves intact. Normal muscle tone, no cerebellar symptoms. Sensation intact.  Psych: Awake and oriented X 3, normal affect, Insight and Judgment appropriate.     Vicie Mutters, PA-C 11:26 AM [facility]

## 2014-09-21 LAB — URINALYSIS, ROUTINE W REFLEX MICROSCOPIC
BILIRUBIN URINE: NEGATIVE
GLUCOSE, UA: NEGATIVE
HGB URINE DIPSTICK: NEGATIVE
KETONES UR: NEGATIVE
Nitrite: NEGATIVE
PH: 6.5 (ref 5.0–8.0)
PROTEIN: NEGATIVE
Specific Gravity, Urine: 1.019 (ref 1.001–1.035)

## 2014-09-21 LAB — BASIC METABOLIC PANEL WITH GFR
BUN: 16 mg/dL (ref 7–25)
CALCIUM: 8.8 mg/dL (ref 8.6–10.4)
CO2: 29 mmol/L (ref 20–31)
CREATININE: 0.98 mg/dL (ref 0.50–0.99)
Chloride: 102 mmol/L (ref 98–110)
GFR, EST AFRICAN AMERICAN: 71 mL/min (ref >=60)
GFR, Est Non African American: 61 mL/min (ref >=60)
GLUCOSE: 81 mg/dL (ref 65–99)
Potassium: 4.2 mmol/L (ref 3.5–5.3)
Sodium: 139 mmol/L (ref 135–146)

## 2014-09-21 LAB — URINALYSIS, MICROSCOPIC ONLY
BACTERIA UA: NONE SEEN [HPF]
CASTS: NONE SEEN [LPF]
CRYSTALS: NONE SEEN [HPF]
YEAST: NONE SEEN [HPF]

## 2014-09-21 LAB — URINE CULTURE
Colony Count: NO GROWTH
Organism ID, Bacteria: NO GROWTH

## 2015-01-23 ENCOUNTER — Ambulatory Visit (INDEPENDENT_AMBULATORY_CARE_PROVIDER_SITE_OTHER): Payer: BC Managed Care – PPO | Admitting: Family Medicine

## 2015-01-23 VITALS — BP 128/72 | HR 85 | Temp 98.6°F | Resp 16 | Ht 64.0 in | Wt 154.0 lb

## 2015-01-23 DIAGNOSIS — J069 Acute upper respiratory infection, unspecified: Secondary | ICD-10-CM | POA: Diagnosis not present

## 2015-01-23 DIAGNOSIS — R05 Cough: Secondary | ICD-10-CM | POA: Diagnosis not present

## 2015-01-23 DIAGNOSIS — R059 Cough, unspecified: Secondary | ICD-10-CM

## 2015-01-23 MED ORDER — BENZONATATE 100 MG PO CAPS: 100.0000 mg | ORAL_CAPSULE | Freq: Three times a day (TID) | ORAL | Status: DC | PRN

## 2015-01-23 MED ORDER — HYDROCODONE-HOMATROPINE 5-1.5 MG/5ML PO SYRP: ORAL_SOLUTION | ORAL | Status: DC

## 2015-01-23 NOTE — Patient Instructions (Signed)
Over the counter mucinex or mucinex DM, Tessalon perles if needed for cough during the day, and hydrocodone cough syrup if needed at night. Drink plenty of fluids, rest as needed. Return to the clinic or go to the nearest emergency room if any of your symptoms worsen or new symptoms occur.  Upper Respiratory Infection, Adult Most upper respiratory infections (URIs) are a viral infection of the air passages leading to the lungs. A URI affects the nose, throat, and upper air passages. The most common type of URI is nasopharyngitis and is typically referred to as "the common cold." URIs run their course and usually go away on their own. Most of the time, a URI does not require medical attention, but sometimes a bacterial infection in the upper airways can follow a viral infection. This is called a secondary infection. Sinus and middle ear infections are common types of secondary upper respiratory infections. Bacterial pneumonia can also complicate a URI. A URI can worsen asthma and chronic obstructive pulmonary disease (COPD). Sometimes, these complications can require emergency medical care and may be life threatening.  CAUSES Almost all URIs are caused by viruses. A virus is a type of germ and can spread from one person to another.  RISKS FACTORS You may be at risk for a URI if:   You smoke.   You have chronic heart or lung disease.  You have a weakened defense (immune) system.   You are very young or very old.   You have nasal allergies or asthma.  You work in crowded or poorly ventilated areas.  You work in health care facilities or schools. SIGNS AND SYMPTOMS  Symptoms typically develop 2-3 days after you come in contact with a cold virus. Most viral URIs last 7-10 days. However, viral URIs from the influenza virus (flu virus) can last 14-18 days and are typically more severe. Symptoms may include:   Runny or stuffy (congested) nose.   Sneezing.   Cough.   Sore throat.    Headache.   Fatigue.   Fever.   Loss of appetite.   Pain in your forehead, behind your eyes, and over your cheekbones (sinus pain).  Muscle aches.  DIAGNOSIS  Your health care provider may diagnose a URI by:  Physical exam.  Tests to check that your symptoms are not due to another condition such as:  Strep throat.  Sinusitis.  Pneumonia.  Asthma. TREATMENT  A URI goes away on its own with time. It cannot be cured with medicines, but medicines may be prescribed or recommended to relieve symptoms. Medicines may help:  Reduce your fever.  Reduce your cough.  Relieve nasal congestion. HOME CARE INSTRUCTIONS   Take medicines only as directed by your health care provider.   Gargle warm saltwater or take cough drops to comfort your throat as directed by your health care provider.  Use a warm mist humidifier or inhale steam from a shower to increase air moisture. This may make it easier to breathe.  Drink enough fluid to keep your urine clear or pale yellow.   Eat soups and other clear broths and maintain good nutrition.   Rest as needed.   Return to work when your temperature has returned to normal or as your health care provider advises. You may need to stay home longer to avoid infecting others. You can also use a face mask and careful hand washing to prevent spread of the virus.  Increase the usage of your inhaler if you have asthma.  Do not use any tobacco products, including cigarettes, chewing tobacco, or electronic cigarettes. If you need help quitting, ask your health care provider. PREVENTION  The best way to protect yourself from getting a cold is to practice good hygiene.   Avoid oral or hand contact with people with cold symptoms.   Wash your hands often if contact occurs.  There is no clear evidence that vitamin C, vitamin E, echinacea, or exercise reduces the chance of developing a cold. However, it is always recommended to get plenty  of rest, exercise, and practice good nutrition.  SEEK MEDICAL CARE IF:   You are getting worse rather than better.   Your symptoms are not controlled by medicine.   You have chills.  You have worsening shortness of breath.  You have brown or red mucus.  You have yellow or brown nasal discharge.  You have pain in your face, especially when you bend forward.  You have a fever.  You have swollen neck glands.  You have pain while swallowing.  You have white areas in the back of your throat. SEEK IMMEDIATE MEDICAL CARE IF:   You have severe or persistent:  Headache.  Ear pain.  Sinus pain.  Chest pain.  You have chronic lung disease and any of the following:  Wheezing.  Prolonged cough.  Coughing up blood.  A change in your usual mucus.  You have a stiff neck.  You have changes in your:  Vision.  Hearing.  Thinking.  Mood. MAKE SURE YOU:   Understand these instructions.  Will watch your condition.  Will get help right away if you are not doing well or get worse.   This information is not intended to replace advice given to you by your health care provider. Make sure you discuss any questions you have with your health care provider.   Document Released: 06/13/2000 Document Revised: 05/04/2014 Document Reviewed: 03/25/2013 Elsevier Interactive Patient Education [employer].

## 2015-01-23 NOTE — Progress Notes (Signed)
Subjective:  This chart was scribed for Candice Ray, MD by [facility], medical scribe at [facility].The patient was seen in exam room 01 and the patient's care was started at 2:00 PM.   Patient ID: [name], female    DOB: 10/26/1950, 65 y.o.   MRN: [id number] Chief Complaint  Patient presents with  . Fever    x 2 days   . Cough    x 3 days   . sneezing  . Generalized Body Aches  . Headache  . Nasal Congestion   HPI HPI Comments: [name] is a 65 y.o. female who presents to [facility] with multiple concerns. Initially started sneezing 4 days ago, then hoarseness, cough, congestion three days ago. Fever began two days ago, measured at 99. One day ago temp was at 100. She has not taken any medication today, but she has been taking nyquil, dayquil and mucinex. Symptoms have gradually improved. She has a hx of bronchitis and is concerned she may develop this.  She has gotten the flu shot this year. No known sick contacts.    Patient Active Problem List   Diagnosis Date Noted  . Vitamin D deficiency 08/17/2014  . Prediabetes 11/11/2013  . Mixed hyperlipidemia 11/11/2013  . Allergy   . Asthma   . Carotid artery stenosis 01/30/2008  . GERD 01/30/2008  . GASTROPARESIS 01/30/2008  . Diverticulosis of large intestine 01/30/2008  . Headache 01/30/2008  . 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 ANEMIA, HX OF 01/30/2008   Past Medical History  Diagnosis Date  . Anemia   . Depression   . GERD (gastroesophageal reflux disease)   . Allergy   . Asthma   . Palpitations     Cardiolite neg 2005, echo 2011 EF 55%   Past Surgical History  Procedure Laterality Date  . Cesarean section    . Abdominal hysterectomy    . Temporomandibular joint surgery    . Varicose vein surgery    . Bunionectomy    . Pyloric stenosis      at 30 weeks old   Allergies  Allergen Reactions  . Metoclopramide Hcl   . Penicillins   .  Shellfish Allergy   . Sulfamethoxazole-Trimethoprim   . Ciprofloxacin Palpitations   Prior to Admission medications   Medication Sig Start Date End Date Taking? Authorizing Provider  albuterol (PROAIR HFA) 108 (90 BASE) MCG/ACT inhaler Inhale 1-2 puffs into the lungs every 4 (four) hours as needed. 03/29/14  Yes Courtney Forcucci, PA-C  Cholecalciferol (VITAMIN D) 2000 UNITS tablet Take 2,000 Units by mouth daily.   Yes Historical Provider, MD  Cinnamon 500 MG capsule Take 500 mg by mouth daily.   Yes Historical Provider, MD  estradiol (MINIVELLE) 0.05 MG/24HR patch Place 1 patch onto the skin 2 (two) times a week.   Yes Historical Provider, MD  Flaxseed, Linseed, (FLAX SEED OIL PO) Take by mouth.   Yes Historical Provider, MD  fluconazole (DIFLUCAN) 150 MG tablet Take 1 tablet (150 mg total) by mouth daily. 09/20/14  Yes Vicie Mutters, PA-C  fluticasone (FLONASE) 50 MCG/ACT nasal spray Place 2 sprays into both nostrils daily. 03/29/14  Yes Courtney Forcucci, PA-C  HYDROCHLOROTHIAZIDE PO Take by mouth daily as needed.   Yes Historical Provider, MD  Linaclotide (LINZESS PO) Take by mouth daily.   Yes Historical Provider, MD  magnesium oxide (MAG-OX) 400 MG tablet Take 400 mg by mouth daily.  Yes Historical Provider, MD  meloxicam (MOBIC) 15 MG tablet Take 15 mg by mouth daily.   Yes Historical Provider, MD  montelukast (SINGULAIR) 10 MG tablet take 1 tablet by mouth once daily 03/27/14 03/27/15 Yes Unk Pinto, MD  tiotropium (SPIRIVA HANDIHALER) 18 MCG inhalation capsule inhale the contents of one capsule in the handihaler once daily 08/17/14  Yes Vicie Mutters, PA-C  verapamil (CALAN-SR) 180 MG CR tablet take 1 tablet by mouth once daily at bedtime 09/17/14  Yes Unk Pinto, MD  vitamin B-12 (CYANOCOBALAMIN) 1000 MCG tablet Take 1,000 mcg by mouth daily.   Yes Historical Provider, MD   Social History   Social History  . Marital Status: Married    Spouse Name: N/A  . Number of  Children: N/A  . Years of Education: N/A   Occupational History  . Not on file.   Social History Main Topics  . Smoking status: Never Smoker   . Smokeless tobacco: Never Used  . Alcohol Use: No  . Drug Use: No  . Sexual Activity: Not on file   Other Topics Concern  . Not on file   Social History Narrative   Review of Systems  Constitutional: Positive for fever.  HENT: Positive for congestion, sneezing and voice change.   Respiratory: Positive for cough.       Objective:  BP 128/72 mmHg  Pulse 85  Temp(Src) 98.6 F (37 C) (Oral)  Resp 16  Ht 5\' 4"  (1.626 m)  Wt 154 lb (69.854 kg)  BMI 26.42 kg/m2  SpO2 98% Physical Exam  Constitutional: She is oriented to person, place, and time. She appears well-developed and well-nourished. No distress.  HENT:  Head: Normocephalic and atraumatic.  Right Ear: Hearing, tympanic membrane, external ear and ear canal normal.  Left Ear: Hearing, tympanic membrane, external ear and ear canal normal.  Nose: Nose normal.  Mouth/Throat: Oropharynx is clear and moist. No oropharyngeal exudate.  Sinuses are non tender.  Eyes: Conjunctivae and EOM are normal. Pupils are equal, round, and reactive to light.  Neck: Normal range of motion.  No lymphadenopathy.  Cardiovascular: Normal rate, regular rhythm, normal heart sounds and intact distal pulses.   No murmur heard. Pulmonary/Chest: Effort normal and breath sounds normal. No respiratory distress. She has no wheezes. She has no rhonchi.  Musculoskeletal: Normal range of motion.  Neurological: She is alert and oriented to person, place, and time.  Skin: Skin is warm and dry. No rash noted.  Psychiatric: She has a normal mood and affect. Her behavior is normal.  Nursing note and vitals reviewed.     Assessment & Plan:   [name] is a 65 y.o. female Cough - Plan: benzonatate (TESSALON) 100 MG capsule, HYDROcodone-homatropine (HYCODAN) 5-1.5 MG/5ML syrup  Acute upper respiratory  infection - Plan: benzonatate (TESSALON) 100 MG capsule, HYDROcodone-homatropine (HYCODAN) 5-1.5 MG/5ML syrup  Suspected viral URI, afebrile and lungs are clear in the office.   -Continue symptomatic care, Tessalon, Mucinex, fluids and rest.  - Hycodan cough syrup as needed at night for cough. Side effects discussed.   - If fevers return, or any worsening of symptoms in next few days, discussed CBC or chest x-Day, or at least a repeat eval here or primary care provider. rtc precautions.  Meds ordered this encounter  Medications  . benzonatate (TESSALON) 100 MG capsule    Sig: Take 1 capsule (100 mg total) by mouth 3 (three) times daily as needed for cough.    Dispense:  20  capsule    Refill:  0  . HYDROcodone-homatropine (HYCODAN) 5-1.5 MG/5ML syrup    Sig: 55m by mouth a bedtime as needed for cough.    Dispense:  120 mL    Refill:  0   Patient Instructions  Over the counter mucinex or mucinex DM, Tessalon perles if needed for cough during the day, and hydrocodone cough syrup if needed at night. Drink plenty of fluids, rest as needed. Return to the clinic or go to the nearest emergency room if any of your symptoms worsen or new symptoms occur.  Upper Respiratory Infection, Adult Most upper respiratory infections (URIs) are a viral infection of the air passages leading to the lungs. A URI affects the nose, throat, and upper air passages. The most common type of URI is nasopharyngitis and is typically referred to as "the common cold." URIs run their course and usually go away on their own. Most of the time, a URI does not require medical attention, but sometimes a bacterial infection in the upper airways can follow a viral infection. This is called a secondary infection. Sinus and middle ear infections are common types of secondary upper respiratory infections. Bacterial pneumonia can also complicate a URI. A URI can worsen asthma and chronic obstructive pulmonary disease (COPD). Sometimes,  these complications can require emergency medical care and may be life threatening.  CAUSES Almost all URIs are caused by viruses. A virus is a type of germ and can spread from one person to another.  RISKS FACTORS You may be at risk for a URI if:   You smoke.   You have chronic heart or lung disease.  You have a weakened defense (immune) system.   You are very young or very old.   You have nasal allergies or asthma.  You work in crowded or poorly ventilated areas.  You work in health care facilities or schools. SIGNS AND SYMPTOMS  Symptoms typically develop 2-3 days after you come in contact with a cold virus. Most viral URIs last 7-10 days. However, viral URIs from the influenza virus (flu virus) can last 14-18 days and are typically more severe. Symptoms may include:   Runny or stuffy (congested) nose.   Sneezing.   Cough.   Sore throat.   Headache.   Fatigue.   Fever.   Loss of appetite.   Pain in your forehead, behind your eyes, and over your cheekbones (sinus pain).  Muscle aches.  DIAGNOSIS  Your health care provider may diagnose a URI by:  Physical exam.  Tests to check that your symptoms are not due to another condition such as:  Strep throat.  Sinusitis.  Pneumonia.  Asthma. TREATMENT  A URI goes away on its own with time. It cannot be cured with medicines, but medicines may be prescribed or recommended to relieve symptoms. Medicines may help:  Reduce your fever.  Reduce your cough.  Relieve nasal congestion. HOME CARE INSTRUCTIONS   Take medicines only as directed by your health care provider.   Gargle warm saltwater or take cough drops to comfort your throat as directed by your health care provider.  Use a warm mist humidifier or inhale steam from a shower to increase air moisture. This may make it easier to breathe.  Drink enough fluid to keep your urine clear or pale yellow.   Eat soups and other clear broths and  maintain good nutrition.   Rest as needed.   Return to work when your temperature has returned to normal  or as your health care provider advises. You may need to stay home longer to avoid infecting others. You can also use a face mask and careful hand washing to prevent spread of the virus.  Increase the usage of your inhaler if you have asthma.   Do not use any tobacco products, including cigarettes, chewing tobacco, or electronic cigarettes. If you need help quitting, ask your health care provider. PREVENTION  The best way to protect yourself from getting a cold is to practice good hygiene.   Avoid oral or hand contact with people with cold symptoms.   Wash your hands often if contact occurs.  There is no clear evidence that vitamin C, vitamin E, echinacea, or exercise reduces the chance of developing a cold. However, it is always recommended to get plenty of rest, exercise, and practice good nutrition.  SEEK MEDICAL CARE IF:   You are getting worse rather than better.   Your symptoms are not controlled by medicine.   You have chills.  You have worsening shortness of breath.  You have brown or red mucus.  You have yellow or brown nasal discharge.  You have pain in your face, especially when you bend forward.  You have a fever.  You have swollen neck glands.  You have pain while swallowing.  You have white areas in the back of your throat. SEEK IMMEDIATE MEDICAL CARE IF:   You have severe or persistent:  Headache.  Ear pain.  Sinus pain.  Chest pain.  You have chronic lung disease and any of the following:  Wheezing.  Prolonged cough.  Coughing up blood.  A change in your usual mucus.  You have a stiff neck.  You have changes in your:  Vision.  Hearing.  Thinking.  Mood. MAKE SURE YOU:   Understand these instructions.  Will watch your condition.  Will get help right away if you are not doing well or get worse.   This information  is not intended to replace advice given to you by your health care provider. Make sure you discuss any questions you have with your health care provider.   Document Released: 06/13/2000 Document Revised: 05/04/2014 Document Reviewed: 03/25/2013 Elsevier Interactive Patient Education [employer].        By signing my name below, I, Nadim Abuhashem, attest that this documentation has been prepared under the direction and in the presence of Candice Ray, MD.  Electronically Signed: Lora Havens, medical scribe. 01/23/2015, 2:10 PM.

## 2015-02-11 ENCOUNTER — Other Ambulatory Visit: Payer: Self-pay | Admitting: Obstetrics & Gynecology

## 2015-02-11 DIAGNOSIS — R928 Other abnormal and inconclusive findings on diagnostic imaging of breast: Secondary | ICD-10-CM

## 2015-02-12 ENCOUNTER — Ambulatory Visit (INDEPENDENT_AMBULATORY_CARE_PROVIDER_SITE_OTHER): Payer: BC Managed Care – PPO | Admitting: Physician Assistant

## 2015-02-12 VITALS — BP 120/62 | HR 62 | Temp 98.0°F | Resp 19 | Ht 64.57 in | Wt 156.4 lb

## 2015-02-12 DIAGNOSIS — N3000 Acute cystitis without hematuria: Secondary | ICD-10-CM

## 2015-02-12 DIAGNOSIS — R300 Dysuria: Secondary | ICD-10-CM

## 2015-02-12 LAB — POCT URINALYSIS DIP (MANUAL ENTRY)
BILIRUBIN UA: NEGATIVE
BILIRUBIN UA: NEGATIVE
GLUCOSE UA: NEGATIVE
Nitrite, UA: NEGATIVE
Protein Ur, POC: NEGATIVE
Urobilinogen, UA: 0.2
pH, UA: 5

## 2015-02-12 LAB — POC MICROSCOPIC URINALYSIS (UMFC): MUCUS RE: ABSENT

## 2015-02-12 MED ORDER — NITROFURANTOIN MONOHYD MACRO 100 MG PO CAPS: 100.0000 mg | ORAL_CAPSULE | Freq: Two times a day (BID) | ORAL | Status: DC

## 2015-02-12 NOTE — Progress Notes (Signed)
02/12/2015 3:04 PM   DOB: April 30, 1950 / MRN: [id number]  SUBJECTIVE:  [name] is a 65 y.o. female presenting for for the evaluation of dysuria, urinary frequency and pelvic pain which started 6 days ago.  Associated symptoms include urinary urgency and the patient denies flank pain, fever, dizziness and nausea. . The patients has tried nothing for these symptoms. She has had several UTI's in the past and reports these symptoms are similar.   She is allergic to metoclopramide hcl; penicillins; shellfish allergy; sulfamethoxazole-trimethoprim; and ciprofloxacin.   She  has a past medical history of Anemia; Depression; GERD (gastroesophageal reflux disease); Allergy; Asthma; and Palpitations.    She  reports that she has never smoked. She has never used smokeless tobacco. She reports that she does not drink alcohol or use illicit drugs. She  has no sexual activity history on file. The patient  has past surgical history that includes Cesarean section; Abdominal hysterectomy; Temporomandibular joint surgery; Varicose vein surgery; Bunionectomy; and pyloric stenosis.  Her family history includes Arthritis in her father; Cancer in her mother; Cerebral aneurysm in her mother; Glaucoma in her father; Hypertension in her father and mother.  Review of Systems  Constitutional: Negative for fever and chills.  Respiratory: Negative for shortness of breath.   Cardiovascular: Negative for chest pain.  Gastrointestinal: Positive for abdominal pain. Negative for nausea.  Genitourinary: Positive for dysuria, urgency and frequency.  Musculoskeletal: Negative for myalgias.  Neurological: Negative for dizziness and headaches.  Endo/Heme/Allergies: Negative for polydipsia.    Problem list and medications reviewed and updated by myself where necessary, and exist elsewhere in the encounter.   OBJECTIVE:  BP 120/62 mmHg  Pulse 62  Temp(Src) 98 F (36.7 C) (Oral)  Resp 19  Ht 5' 4.57" (1.64 m)  Wt  156 lb 6.4 oz (70.943 kg)  BMI 26.38 kg/m2  SpO2 95%  Physical Exam  Constitutional: She is oriented to person, place, and time. She appears well-nourished. No distress.  Eyes: EOM are normal. Pupils are equal, round, and reactive to light.  Cardiovascular: Normal rate.   Pulmonary/Chest: Effort normal.  Abdominal: She exhibits no distension. There is tenderness (suprapubic).  Neurological: She is alert and oriented to person, place, and time. No cranial nerve deficit. Gait normal.  Skin: Skin is dry. She is not diaphoretic.  Psychiatric: She has a normal mood and affect.  Vitals reviewed.   Results for orders placed or performed in visit on 02/12/15 (from the past 48 hour(s))  POCT urinalysis dipstick     Status: Abnormal   Collection Time: 02/12/15  2:57 PM  Result Value Ref Range   Color, UA light yellow (A) yellow   Clarity, UA clear clear   Glucose, UA negative negative   Bilirubin, UA negative negative   Ketones, POC UA negative negative   Spec Grav, UA <=1.005    Blood, UA small (A) negative   pH, UA 5.0    Protein Ur, POC negative negative   Urobilinogen, UA 0.2    Nitrite, UA Negative Negative   Leukocytes, UA small (1+) (A) Negative  POCT Microscopic Urinalysis ([facility])     Status: Abnormal   Collection Time: 02/12/15  2:57 PM  Result Value Ref Range   WBC,UR,HPF,POC Few (A) None WBC/hpf   RBC,UR,HPF,POC Few (A) None RBC/hpf   Bacteria None None, Too numerous to count   Mucus Absent Absent   Epithelial Cells, UR Per Microscopy None None, Too numerous to count cells/hpf  ASSESSMENT AND PLAN:  [name] was seen today for urinary tract infection.  Diagnoses and all orders for this visit:  Dysuria: Mild leukocytes in her urine however she is very well hydrated and may be diluting her neutrophils.  Will treat and culture.   -     POCT urinalysis dipstick -     POCT Microscopic Urinalysis ([facility]) -     Urine culture    The patient was advised to call or return  to clinic if she does not see an improvement in symptoms or to seek the care of the closest emergency department if she worsens with the above plan.   Philis Fendt, MHS, PA-C [facility] 02/12/2015 3:04 PM

## 2015-02-13 LAB — URINE CULTURE
Colony Count: NO GROWTH
ORGANISM ID, BACTERIA: NO GROWTH

## 2015-02-17 ENCOUNTER — Ambulatory Visit
Admission: RE | Admit: 2015-02-17 | Discharge: 2015-02-17 | Disposition: A | Discharge status: Home / Self Care | Payer: BC Managed Care – PPO | Source: Ambulatory Visit | Attending: Obstetrics & Gynecology | Admitting: Obstetrics & Gynecology

## 2015-02-17 DIAGNOSIS — R928 Other abnormal and inconclusive findings on diagnostic imaging of breast: Secondary | ICD-10-CM

## 2015-02-25 ENCOUNTER — Encounter: Payer: Self-pay | Admitting: Physician Assistant

## 2015-02-25 ENCOUNTER — Ambulatory Visit (INDEPENDENT_AMBULATORY_CARE_PROVIDER_SITE_OTHER): Payer: BC Managed Care – PPO | Admitting: Physician Assistant

## 2015-02-25 VITALS — BP 118/70 | HR 64 | Temp 97.9°F | Resp 16 | Ht 64.0 in | Wt 158.6 lb

## 2015-02-25 DIAGNOSIS — I6529 Occlusion and stenosis of unspecified carotid artery: Secondary | ICD-10-CM | POA: Diagnosis not present

## 2015-02-25 DIAGNOSIS — J029 Acute pharyngitis, unspecified: Secondary | ICD-10-CM | POA: Diagnosis not present

## 2015-02-25 DIAGNOSIS — E782 Mixed hyperlipidemia: Secondary | ICD-10-CM | POA: Diagnosis not present

## 2015-02-25 DIAGNOSIS — E559 Vitamin D deficiency, unspecified: Secondary | ICD-10-CM | POA: Diagnosis not present

## 2015-02-25 DIAGNOSIS — R7303 Prediabetes: Secondary | ICD-10-CM

## 2015-02-25 DIAGNOSIS — Z79899 Other long term (current) drug therapy: Secondary | ICD-10-CM | POA: Diagnosis not present

## 2015-02-25 LAB — CBC WITH DIFFERENTIAL/PLATELET
BASOS ABS: 0.1 10*3/uL (ref 0.0–0.1)
Basophils Relative: 1 % (ref 0–1)
Eosinophils Absolute: 0.1 10*3/uL (ref 0.0–0.7)
Eosinophils Relative: 2 % (ref 0–5)
HEMATOCRIT: 37 % (ref 36.0–46.0)
HEMOGLOBIN: 12.3 g/dL (ref 12.0–15.0)
LYMPHS PCT: 32 % (ref 12–46)
Lymphs Abs: 1.6 10*3/uL (ref 0.7–4.0)
MCH: 29.7 pg (ref 26.0–34.0)
MCHC: 33.2 g/dL (ref 30.0–36.0)
MCV: 89.4 fL (ref 78.0–100.0)
MPV: 9.2 fL (ref 8.6–12.4)
Monocytes Absolute: 0.7 10*3/uL (ref 0.1–1.0)
Monocytes Relative: 14 % — ABNORMAL HIGH (ref 3–12)
NEUTROS ABS: 2.6 10*3/uL (ref 1.7–7.7)
NEUTROS PCT: 51 % (ref 43–77)
Platelets: 299 10*3/uL (ref 150–400)
RBC: 4.14 MIL/uL (ref 3.87–5.11)
RDW: 13.1 % (ref 11.5–15.5)
WBC: 5.1 10*3/uL (ref 4.0–10.5)

## 2015-02-25 LAB — MAGNESIUM: Magnesium: 1.8 mg/dL (ref 1.5–2.5)

## 2015-02-25 LAB — LIPID PANEL
CHOLESTEROL: 195 mg/dL (ref 125–200)
HDL: 44 mg/dL — ABNORMAL LOW (ref >=46)
LDL Cholesterol: 115 mg/dL (ref <130)
TRIGLYCERIDES: 178 mg/dL — AB (ref <150)
Total CHOL/HDL Ratio: 4.4 Ratio (ref <=5.0)
VLDL: 36 mg/dL — AB (ref <30)

## 2015-02-25 LAB — BASIC METABOLIC PANEL WITH GFR
BUN: 11 mg/dL (ref 7–25)
CO2: 27 mmol/L (ref 20–31)
Calcium: 8.8 mg/dL (ref 8.6–10.4)
Chloride: 101 mmol/L (ref 98–110)
Creat: 0.9 mg/dL (ref 0.50–0.99)
GFR, EST NON AFRICAN AMERICAN: 68 mL/min (ref >=60)
GFR, Est African American: 78 mL/min (ref >=60)
GLUCOSE: 83 mg/dL (ref 65–99)
POTASSIUM: 4.4 mmol/L (ref 3.5–5.3)
Sodium: 136 mmol/L (ref 135–146)

## 2015-02-25 LAB — HEPATIC FUNCTION PANEL
ALK PHOS: 78 U/L (ref 33–130)
ALT: 18 U/L (ref 6–29)
AST: 23 U/L (ref 10–35)
Albumin: 3.6 g/dL (ref 3.6–5.1)
BILIRUBIN DIRECT: 0.1 mg/dL (ref <=0.2)
BILIRUBIN INDIRECT: 0.5 mg/dL (ref 0.2–1.2)
TOTAL PROTEIN: 6.1 g/dL (ref 6.1–8.1)
Total Bilirubin: 0.6 mg/dL (ref 0.2–1.2)

## 2015-02-25 LAB — TSH: TSH: 1.2 m[IU]/L

## 2015-02-25 MED ORDER — VALACYCLOVIR HCL 1 G PO TABS: 1000.0000 mg | ORAL_TABLET | Freq: Three times a day (TID) | ORAL | Status: DC

## 2015-02-25 NOTE — Patient Instructions (Signed)
Can take zantac 300mg  1-2 x a day  Common causes of cough OR hoarseness OR sore throat:   Allergies, Viral Infections, Acid Reflux and Bacterial Infections.  1) Allergies and viral infections cause a cough OR sore throat by post nasal drip and are often worse at night, can also have sneezing, lower grade fevers, clear/yellow mucus. This is best treated with allergy medications or nasal sprays.  Please get on allegra for 1-2 weeks The strongest is allegra or fexafinadine  Cheapest at [employer], [employer], [employer]  2) Bacterial infections are more severe than allergies or viral infections with fever, teeth pain, fatigue. This can be treated with prednisone and the same over the counter medication and after 7 days can be treated with an antibiotic.   3) Silent reflux/GERD can cause a cough OR sore throat OR hoarseness WITHOUT heart burn because the esophagus that goes to the stomach and trachea that goes to the lungs are very close and when you lay down the acid can irritate your throat and lungs. This can cause hoarseness, cough, and wheezing. Please stop any alcohol or anti-inflammatories like aleve/advil/ibuprofen and start an over the counter Prilosec or omeprazole 1-2 times daily 19mins before food for 2 weeks, then switch to over the counter zantac/ratinidine or pepcid/famotadine once at night for 2 weeks.   4) sometimes irritation causes more irritation. Try voice rest, use sugar free cough drops to prevent coughing, and try to stop clearing your throat.   If you ever have a cough that does not go away after trying these things please make a follow up visit for further evaluation or we can refer you to a specialist. Or if you ever have shortness of breath or chest pain go to the ER.

## 2015-02-25 NOTE — Progress Notes (Signed)
Assessment and Plan:  Hypertension: Continue medication, monitor blood pressure at home. Continue DASH diet.  Reminder to go to the ER if any CP, SOB, nausea, dizziness, severe HA, changes vision/speech, left arm numbness and tingling, and jaw pain. Cholesterol: Continue diet and exercise. Check cholesterol.  Pre-diabetes-Continue diet and exercise. Check A1C Vitamin D Def- check level and continue medications.  Sore throat- likely viral HSV strep- will send in valcyclovir, call if not improving, go to ER if any worsening symptoms UTI versus vaginal atrophy- discuss vaginal estrogen with GYN.   Continue diet and meds as discussed. Further disposition pending results of labs. Future Appointments Date Time Provider [place]  08/17/2015 2:00 PM Vicie Mutters, PA-C [id number] None     HPI 65 y.o. female  presents for 6 month follow up with hypertension, hyperlipidemia, prediabetes and vitamin D. Her blood pressure has been controlled at home, today their BP is BP: 118/70 mmHg She does workout. She denies chest pain, shortness of breath, dizziness.  She is not on cholesterol medication and denies myalgias. Her cholesterol is at goal. The cholesterol last visit was:   Lab Results  Component Value Date   CHOL 215* 08/17/2014   HDL 47 08/17/2014   LDLCALC 133* 08/17/2014   TRIG 176* 08/17/2014   CHOLHDL 4.6 08/17/2014   She has been working on diet and exercise for prediabetes, and denies polydipsia, polyuria and visual disturbances. Last A1C in the office was:  Lab Results  Component Value Date   HGBA1C 5.5 08/17/2014   Patient is on Vitamin D supplement.   Lab Results  Component Value Date   VD25OH 24 08/17/2014     She was on macrobid for recent UTI from urgent care, culture came back negative, has vaginal dryness.  She has had sore throat x Wednesday with blisters in throat, no fever, chills. No history of HSV 1.   Current Medications:  Current Outpatient Prescriptions  on File Prior to Visit  Medication Sig Dispense Refill  . albuterol (PROAIR HFA) 108 (90 BASE) MCG/ACT inhaler Inhale 1-2 puffs into the lungs every 4 (four) hours as needed. 18 g 3  . Cholecalciferol (VITAMIN D) 2000 UNITS tablet Take 2,000 Units by mouth daily.    . Cinnamon 500 MG capsule Take 500 mg by mouth daily.    Marland Kitchen estradiol (MINIVELLE) 0.05 MG/24HR patch Place 1 patch onto the skin 2 (two) times a week.    . Flaxseed, Linseed, (FLAX SEED OIL PO) Take by mouth.    . fluticasone (FLONASE) 50 MCG/ACT nasal spray Place 2 sprays into both nostrils daily. 16 g 6  . HYDROCHLOROTHIAZIDE PO Take by mouth daily as needed.    . magnesium oxide (MAG-OX) 400 MG tablet Take 400 mg by mouth daily.    . meloxicam (MOBIC) 15 MG tablet Take 15 mg by mouth daily.    . montelukast (SINGULAIR) 10 MG tablet take 1 tablet by mouth once daily 90 tablet 3  . tiotropium (SPIRIVA HANDIHALER) 18 MCG inhalation capsule inhale the contents of one capsule in the handihaler once daily 30 capsule 5  . verapamil (CALAN-SR) 180 MG CR tablet take 1 tablet by mouth once daily at bedtime 90 tablet 1  . vitamin B-12 (CYANOCOBALAMIN) 1000 MCG tablet Take 1,000 mcg by mouth daily.     Current Facility-Administered Medications on File Prior to Visit  Medication Dose Route Frequency Provider Last Rate Last Dose  . ipratropium-albuterol (DUONEB) 0.5-2.5 (3) MG/3ML nebulizer solution 3 mL  3  mL Nebulization Once Kelby Aline, PA-C       Medical History:  Past Medical History  Diagnosis Date  . Anemia   . Depression   . GERD (gastroesophageal reflux disease)   . Allergy   . Asthma   . Palpitations     Cardiolite neg 2005, echo 2011 EF 55%   Allergies:  Allergies  Allergen Reactions  . Metoclopramide Hcl   . Penicillins   . Shellfish Allergy   . Sulfamethoxazole-Trimethoprim   . Ciprofloxacin Palpitations    Review of Systems  Constitutional: Negative.   HENT: Positive for sore throat. Negative for congestion,  ear discharge, ear pain, hearing loss, nosebleeds and tinnitus.   Respiratory: Positive for cough. Negative for hemoptysis, sputum production, shortness of breath, wheezing and stridor.   Cardiovascular: Negative.   Gastrointestinal: Negative.   Genitourinary: Negative.   Musculoskeletal: Negative.   Skin: Negative.   Neurological: Negative.  Negative for headaches.  Psychiatric/Behavioral: Negative.     Family history- Review and unchanged Social history- Review and unchanged Physical Exam: BP 118/70 mmHg  Pulse 64  Temp(Src) 97.9 F (36.6 C) (Temporal)  Resp 16  Ht 5\' 4"  (1.626 m)  Wt 158 lb 9.6 oz (71.94 kg)  BMI 27.21 kg/m2  SpO2 96% Wt Readings from Last 3 Encounters:  02/25/15 158 lb 9.6 oz (71.94 kg)  02/12/15 156 lb 6.4 oz (70.943 kg)  01/23/15 154 lb (69.854 kg)   General Appearance: Well nourished, in no apparent distress. Eyes: PERRLA, EOMs, conjunctiva no swelling or erythema Sinuses: No Frontal/maxillary tenderness ENT/Mouth: Ext aud canals clear, TMs without erythema, bulging. + erythematous blisters on bilateral pharynx without swelling or exudate on post pharynx.  Tonsils not swollen or erythematous. Hearing normal.  Neck: Supple, thyroid normal.  Respiratory: Respiratory effort normal, BS equal bilaterally without rales, rhonchi, wheezing or stridor.  Cardio: RRR with no MRGs. Brisk peripheral pulses without edema.  Abdomen: Soft, + BS.  Mild epigastric tender, no guarding, rebound, hernias, masses. Lymphatics: Non tender without lymphadenopathy.  Musculoskeletal: Full ROM, 5/5 strength, normal gait.  Skin: Warm, dry without rashes, lesions, ecchymosis.  Neuro: Cranial nerves intact. Normal muscle tone, no cerebellar symptoms. Sensation intact.  Psych: Awake and oriented X 3, normal affect, Insight and Judgment appropriate.    Vicie Mutters, PA-C 10:18 AM [facility]

## 2015-02-26 LAB — VITAMIN D 25 HYDROXY (VIT D DEFICIENCY, FRACTURES): Vit D, 25-Hydroxy: 53 ng/mL (ref 30–100)

## 2015-03-03 ENCOUNTER — Other Ambulatory Visit: Payer: Self-pay | Admitting: Internal Medicine

## 2015-03-03 ENCOUNTER — Encounter: Payer: Self-pay | Admitting: Internal Medicine

## 2015-03-03 DIAGNOSIS — J029 Acute pharyngitis, unspecified: Secondary | ICD-10-CM

## 2015-03-03 MED ORDER — PREDNISONE 20 MG PO TABS: ORAL_TABLET | ORAL | Status: DC

## 2015-03-03 MED ORDER — AZITHROMYCIN 250 MG PO TABS: ORAL_TABLET | ORAL | Status: DC

## 2015-03-19 ENCOUNTER — Other Ambulatory Visit: Payer: Self-pay | Admitting: Internal Medicine

## 2015-03-30 ENCOUNTER — Other Ambulatory Visit: Payer: Self-pay | Admitting: Dermatology

## 2015-06-21 ENCOUNTER — Encounter: Payer: Self-pay | Admitting: Physician Assistant

## 2015-08-02 ENCOUNTER — Other Ambulatory Visit: Payer: Self-pay | Admitting: Dermatology

## 2015-08-16 ENCOUNTER — Other Ambulatory Visit: Payer: Self-pay | Admitting: Physician Assistant

## 2015-08-16 DIAGNOSIS — J45909 Unspecified asthma, uncomplicated: Secondary | ICD-10-CM

## 2015-08-17 ENCOUNTER — Encounter: Payer: Self-pay | Admitting: Physician Assistant

## 2015-08-26 ENCOUNTER — Encounter: Payer: Self-pay | Admitting: Physician Assistant

## 2015-09-01 ENCOUNTER — Encounter: Payer: Self-pay | Admitting: Physician Assistant

## 2015-09-09 ENCOUNTER — Other Ambulatory Visit: Payer: Self-pay | Admitting: Physician Assistant

## 2016-01-06 ENCOUNTER — Encounter: Payer: Self-pay | Admitting: *Deleted

## 2016-03-01 ENCOUNTER — Other Ambulatory Visit: Payer: Self-pay | Admitting: Physician Assistant

## 2016-06-13 ENCOUNTER — Other Ambulatory Visit: Payer: Self-pay | Admitting: Dermatology

## 2016-09-13 ENCOUNTER — Encounter: Payer: Self-pay | Admitting: Physician Assistant

## 2016-10-31 ENCOUNTER — Other Ambulatory Visit: Payer: Self-pay | Admitting: Dermatology

## 2016-11-29 ENCOUNTER — Other Ambulatory Visit: Payer: Self-pay | Admitting: Dermatology

## 2017-02-23 IMAGING — US ULTRASOUND ABDOMEN LIMITED
1 series · 14 of 25 positions shown · non-contrast
Comparison: None.

CLINICAL DATA: Upper abdomen pain for 5 days.

EXAM:
ULTRASOUND ABDOMEN LIMITED RIGHT UPPER QUADRANT

[Series 1: ultrasound abdomen limited · 0.20mm/px · 14 of 52 slices shown]
[im 1/52]
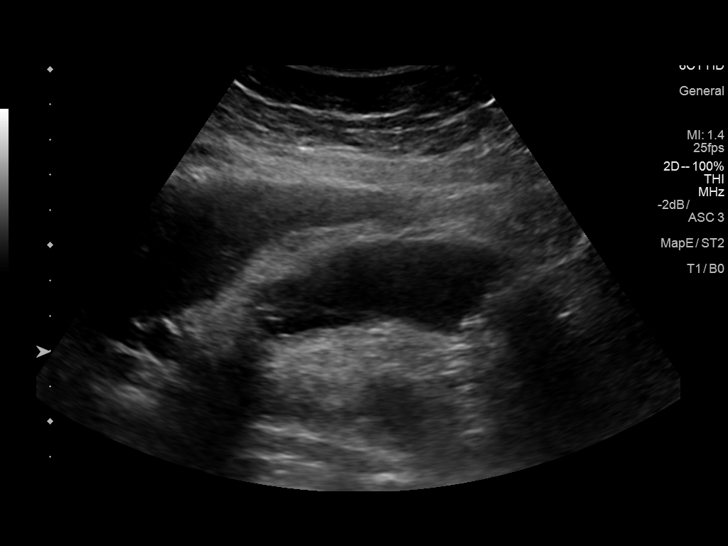
[im 5/52]
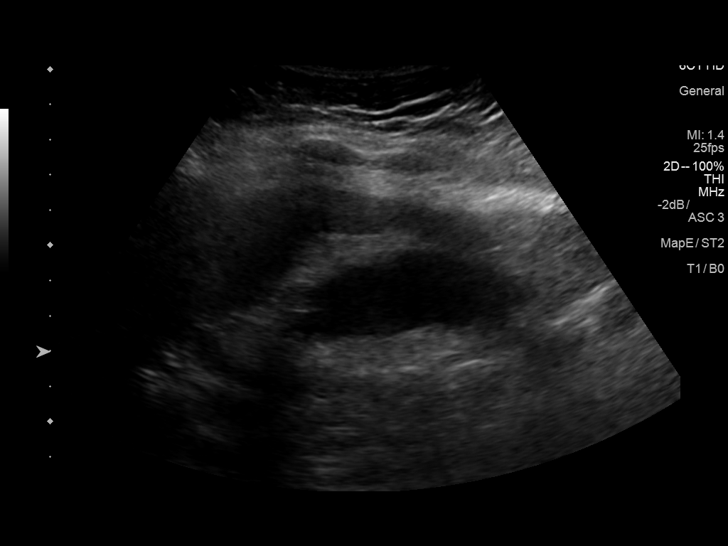
[im 9/52]
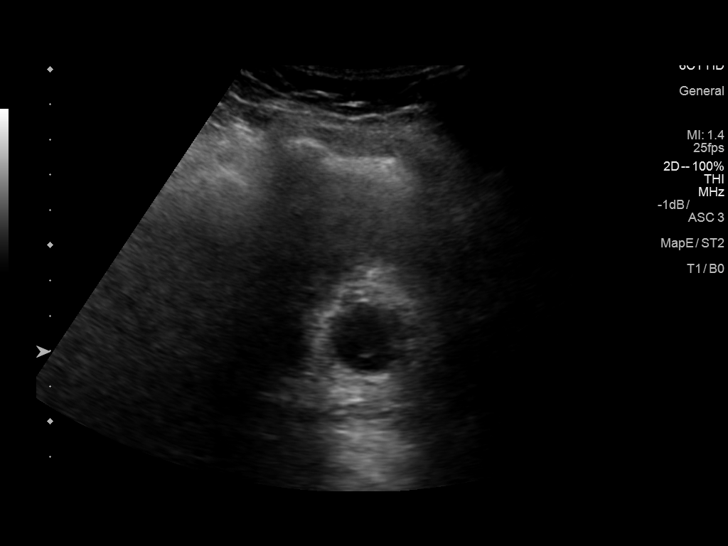
[im 13/52]
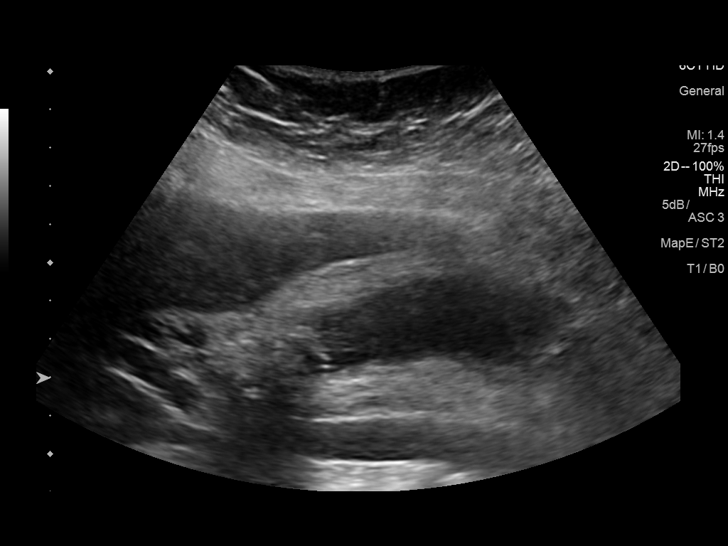
[im 18/52]
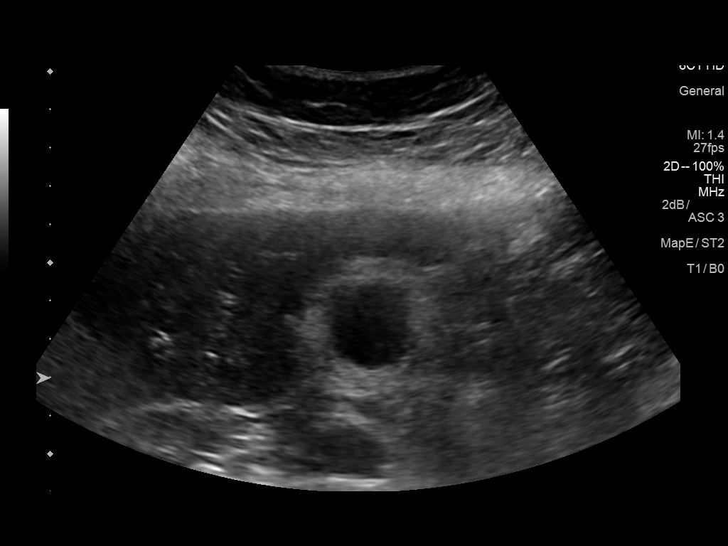
[im 20/52]
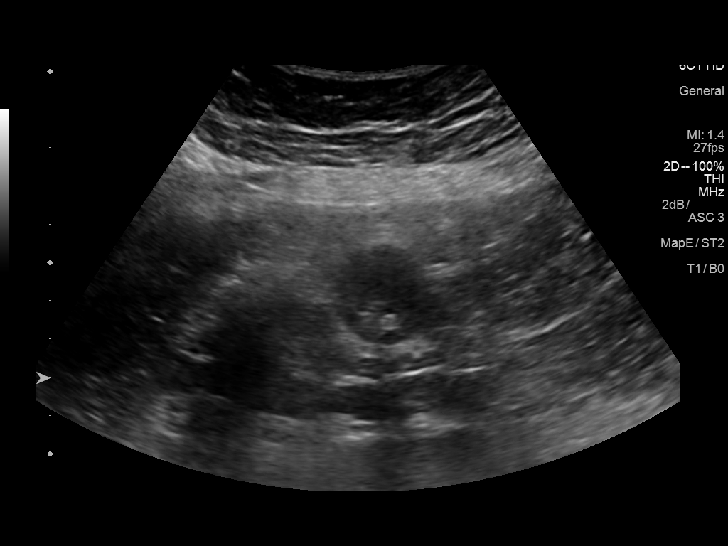
[im 24/52]
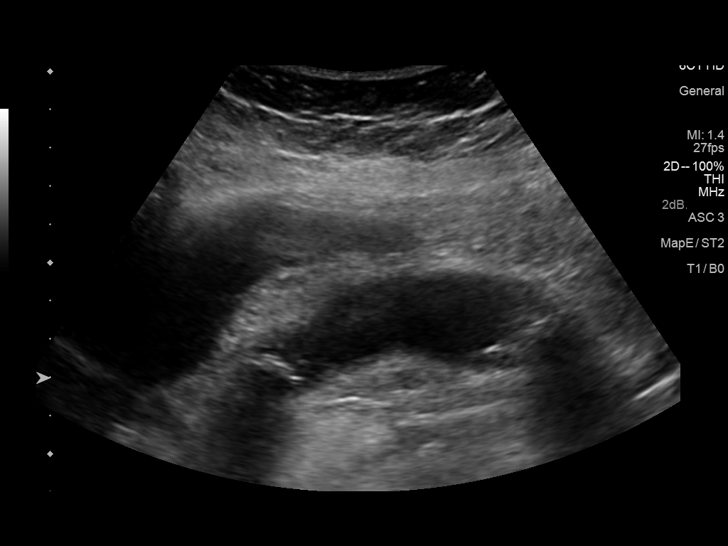
[im 28/52]
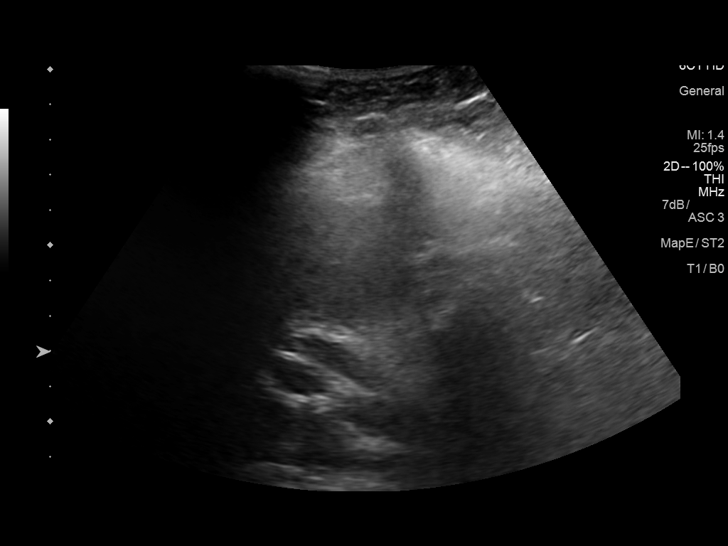
[im 32/52]
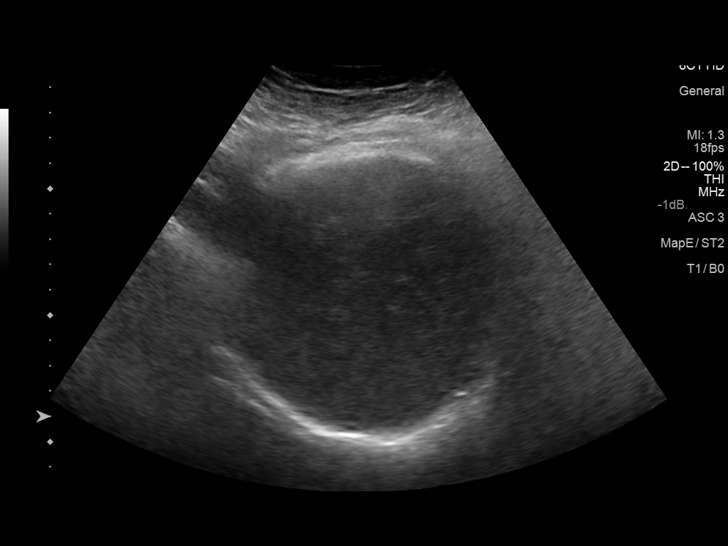
[im 35/52]
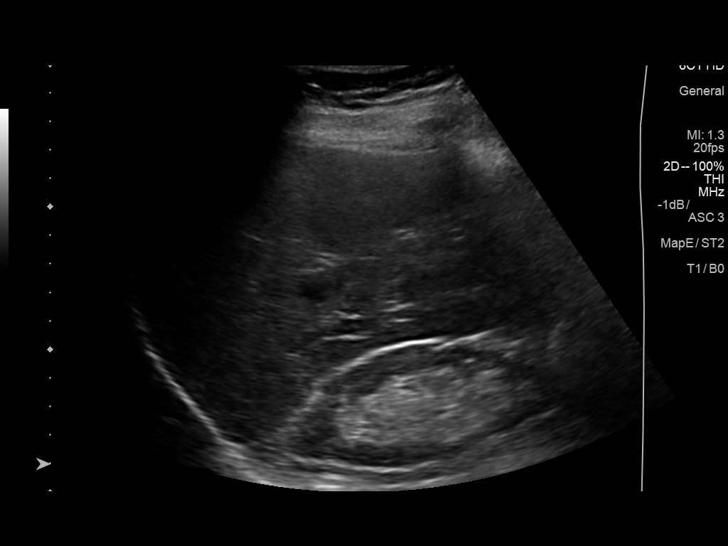
[im 39/52]
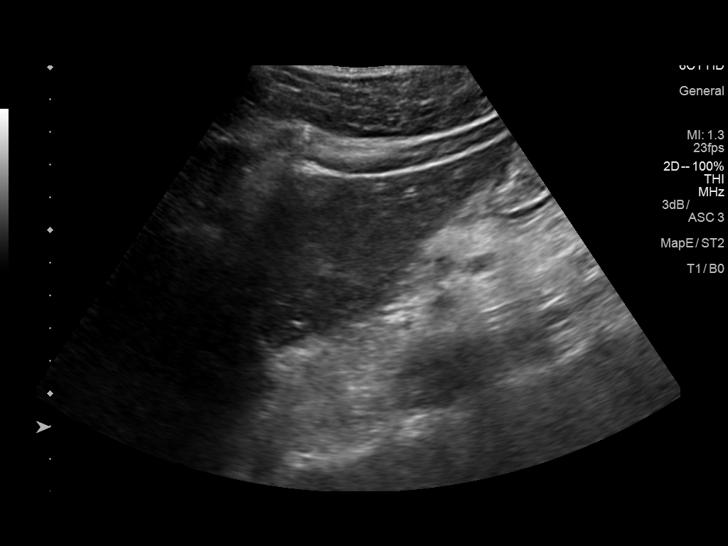
[im 43/52]
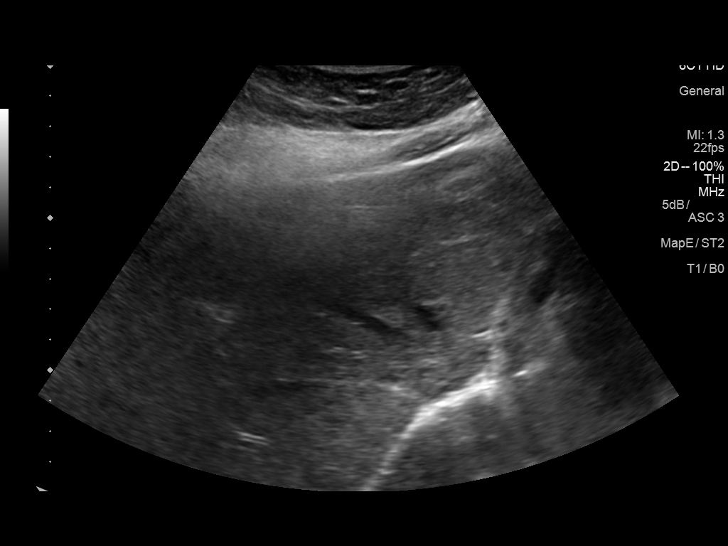
[im 47/52]
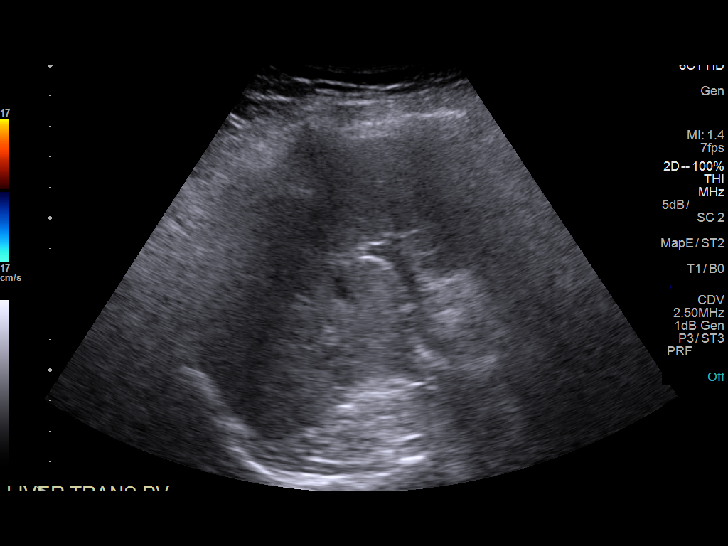
[im 52/52]
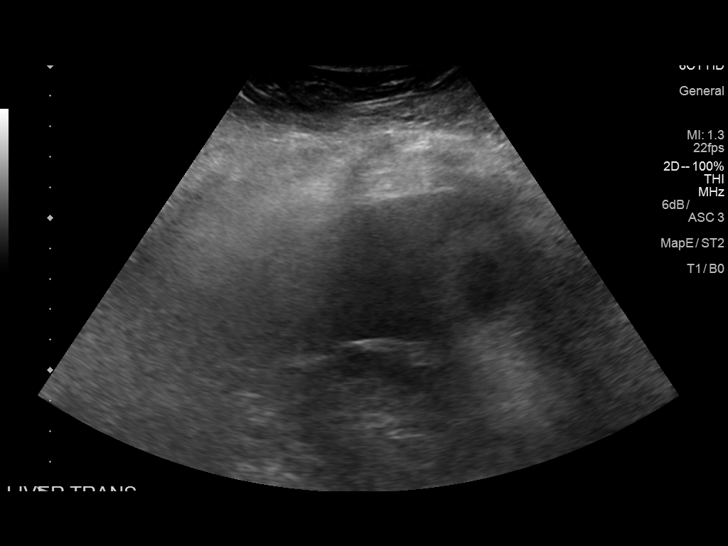

[14 of 25 positions shown; findings below may reference images not displayed]

FINDINGS: Gallbladder:

Gallstones are noted the gallbladder. The gallbladder wall is
thickened measuring 11.7 mm. No sonographic Murphy sign noted by
sonographer.

Common bile duct:

Diameter: 8.2 mm

Liver:

Evaluation is limited due to overlying bowel gas. No focal lesion
identified. Within normal limits in parenchymal echogenicity. Portal
vein is patent on color Doppler imaging with normal direction of
blood flow towards the liver.
IMPRESSION: Gallbladder wall thickening with gallstones noted in the
gallbladder. The ultrasonographer reports no sonographic Murphy
sign. The findings are equivocal for acute cholecystitis. Clinical
correlation is recommended.

## 2017-08-14 ENCOUNTER — Other Ambulatory Visit: Payer: Self-pay | Admitting: Dermatology

## 2017-12-10 ENCOUNTER — Other Ambulatory Visit: Payer: Self-pay | Admitting: Internal Medicine

## 2017-12-10 DIAGNOSIS — E785 Hyperlipidemia, unspecified: Secondary | ICD-10-CM

## 2017-12-12 ENCOUNTER — Ambulatory Visit
Admission: RE | Admit: 2017-12-12 | Discharge: 2017-12-12 | Disposition: A | Discharge status: Home / Self Care | Payer: BC Managed Care – PPO | Source: Ambulatory Visit | Attending: Internal Medicine | Admitting: Internal Medicine

## 2017-12-12 DIAGNOSIS — E785 Hyperlipidemia, unspecified: Secondary | ICD-10-CM

## 2017-12-12 IMAGING — CT CT HEART SCORING
3 series · 14 of 20 positions shown, 16 images · non-contrast
Comparison: None.

CLINICAL DATA: Hyperlipidemia

EXAM:
CT HEART FOR CALCIUM SCORING
TECHNIQUE: CT heart was performed on a 64 channel system using prospective ECG
gating.
A non-contrast exam for calcium scoring was performed.
Note that this exam targets the heart and the chest was not imaged
in its entirety.

[Series 2: calcium scoring 2.00 qr36 bestdiast 70% · axial · 0.34mm/px · z∈[+1518,+1602]mm · 4 of 70 slices shown]
[im 14/70  vessel]
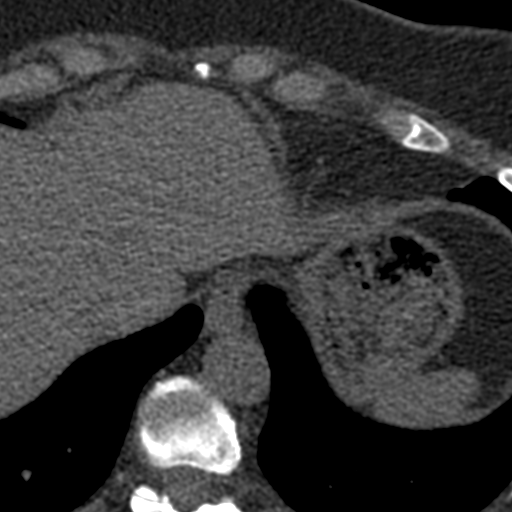
[im 28/70  vessel]
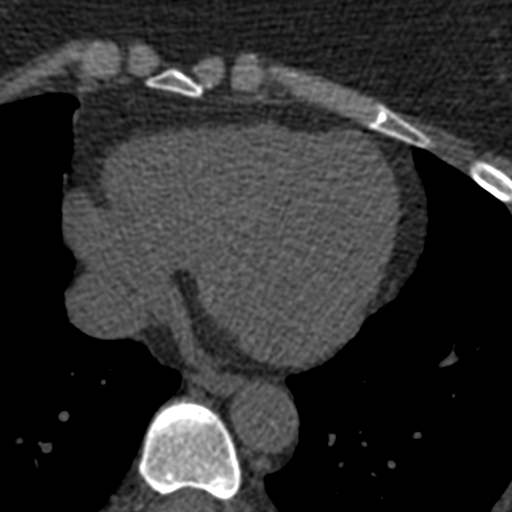
[im 42/70  vessel]
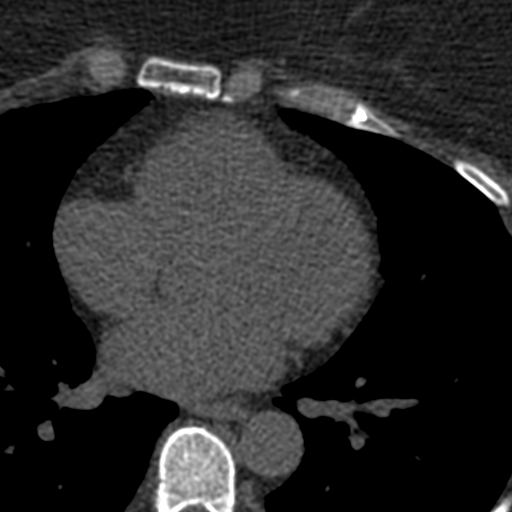
[im 56/70  vessel]
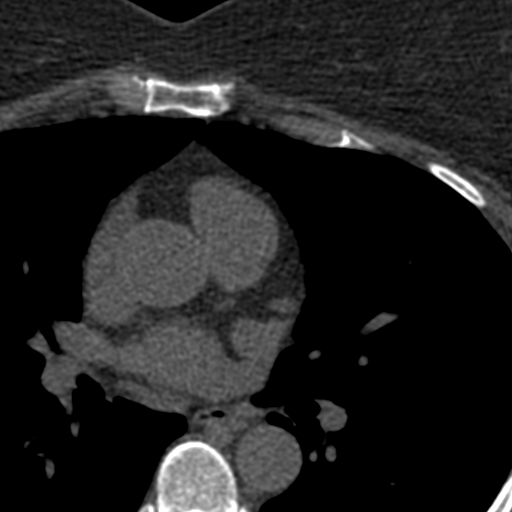

[Series 3: calcium scoring 2.00 br40 bestdiast 70% ax fov · axial · 0.46mm/px · z∈[+1514,+1606]mm · 5 of 70 slices shown, 7 images]
[im 12/70  vessel]
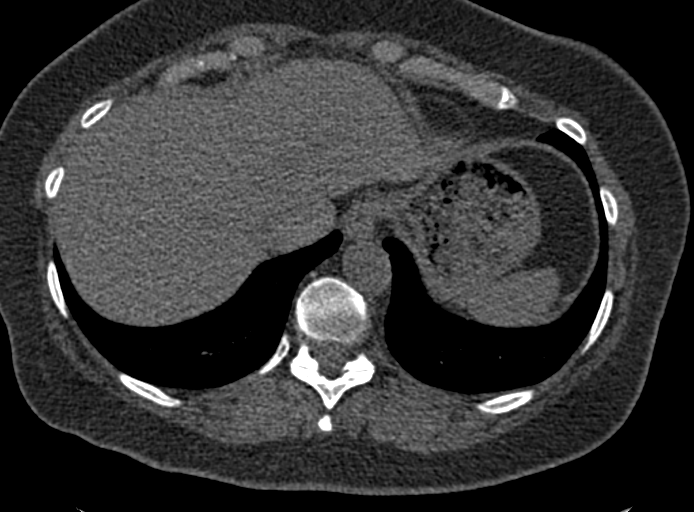
[im 12/70  lung]
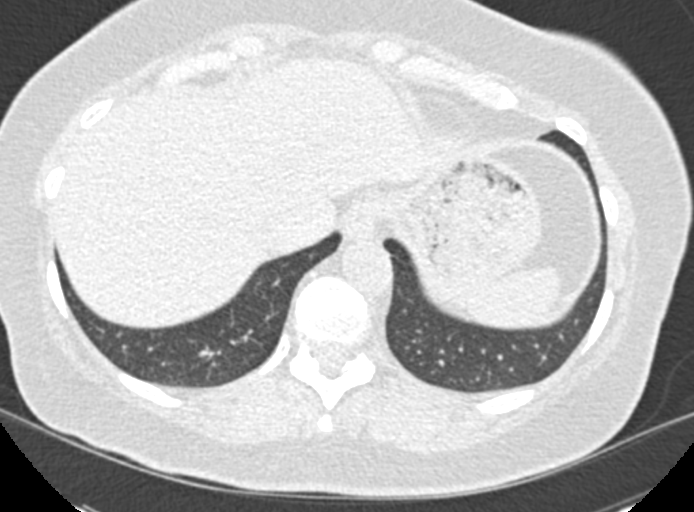
[im 24/70  vessel]
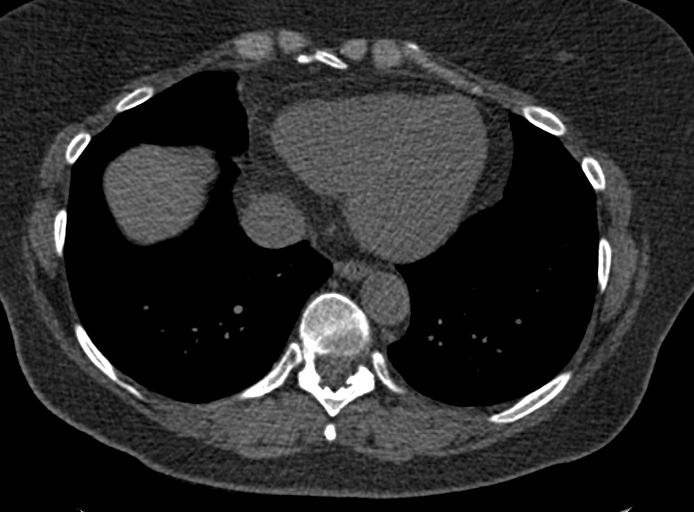
[im 35/70  vessel]
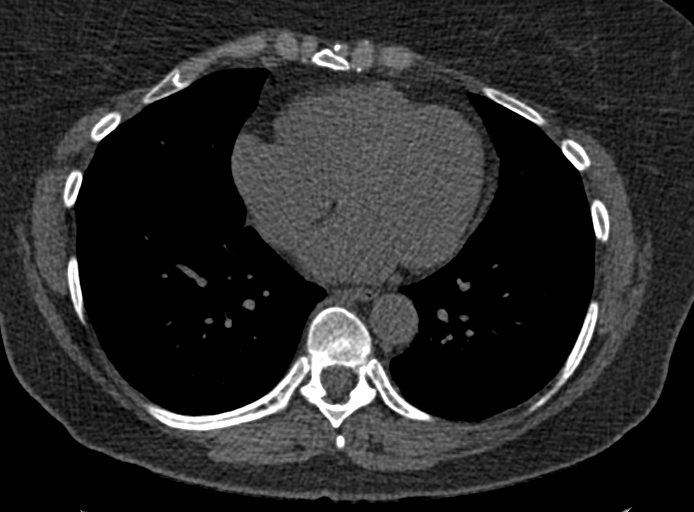
[im 47/70  vessel]
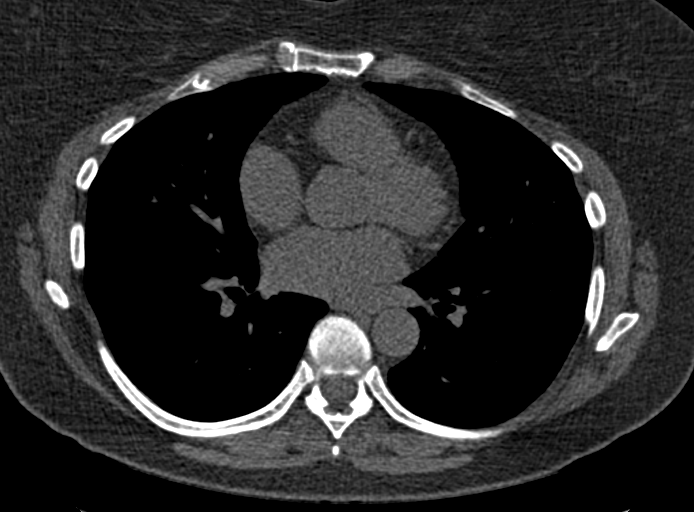
[im 58/70  vessel]
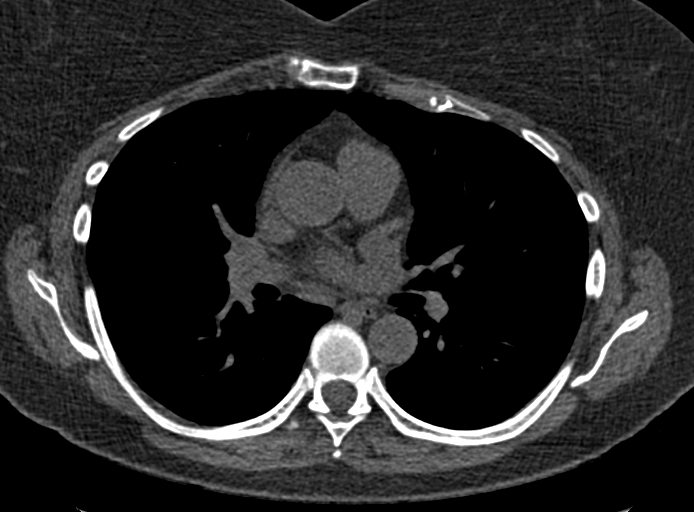
[im 58/70  lung]
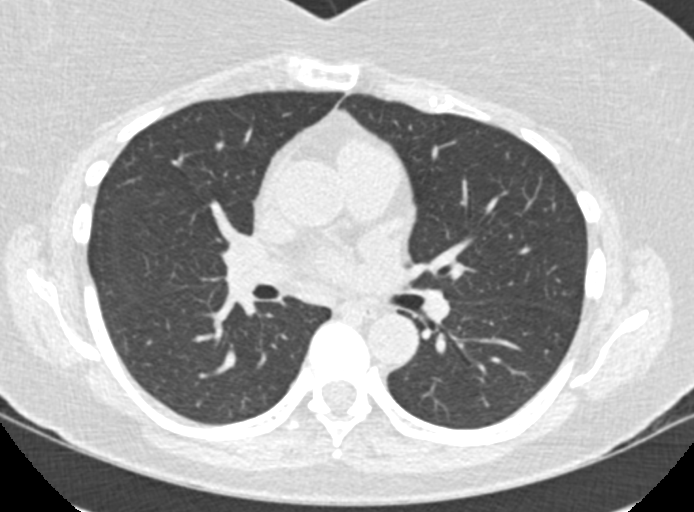

[Series 9: calcium scoring 2.00 br60 bestdiast 70% ax fov · axial · 0.46mm/px · z∈[+1514,+1606]mm · 5 of 70 slices shown]
[im 12/70  vessel]
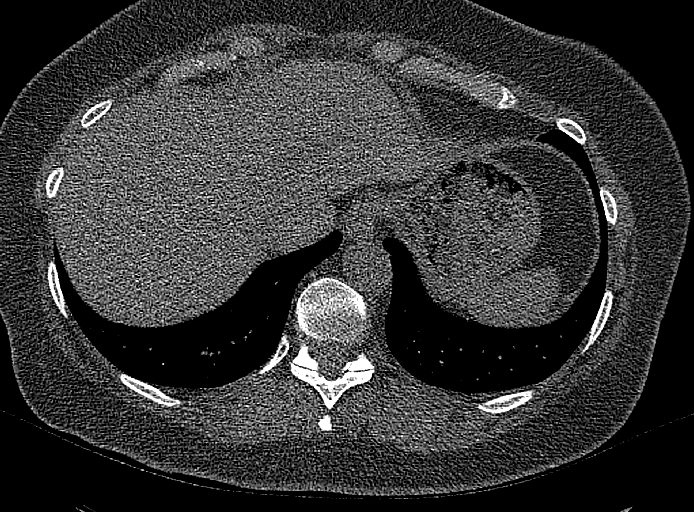
[im 24/70  vessel]
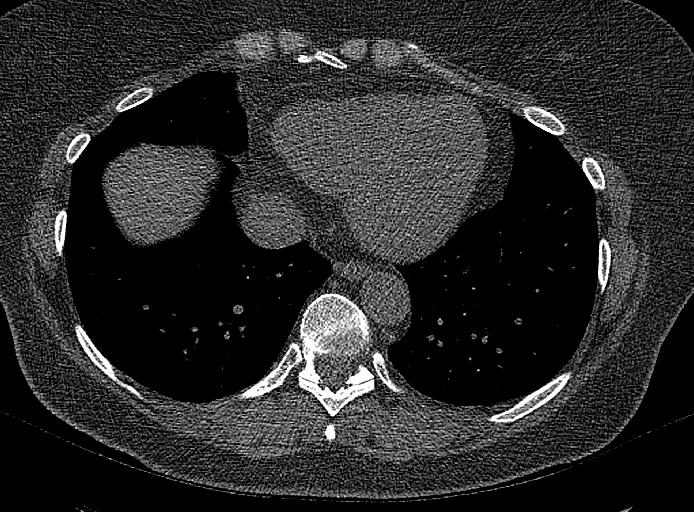
[im 35/70  vessel]
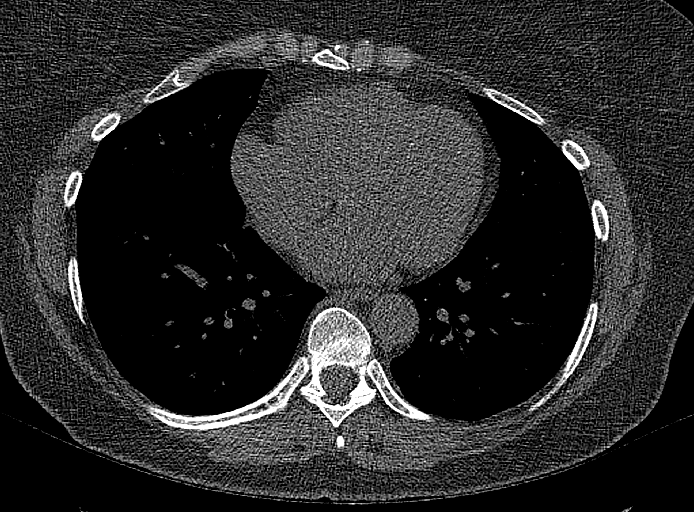
[im 47/70  vessel]
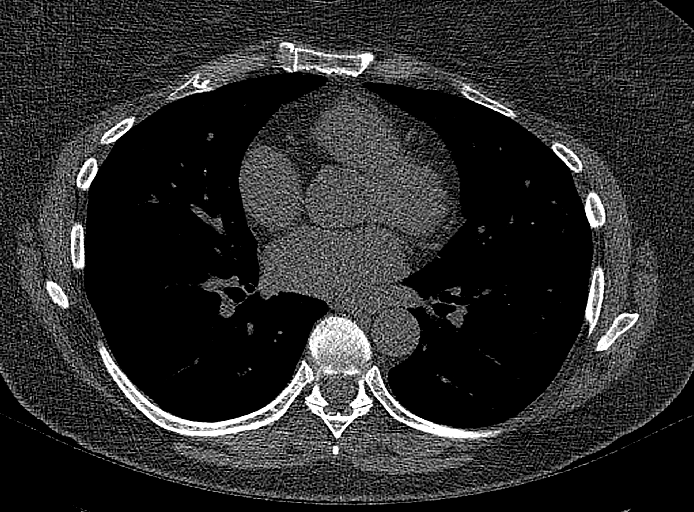
[im 58/70  vessel]
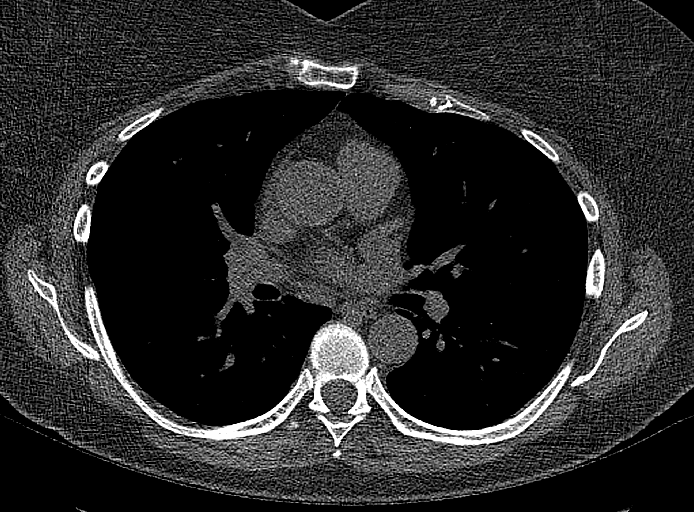

[14 of 20 positions shown; findings below may reference images not displayed]

FINDINGS: Technical quality: Good.

CORONARY CALCIUM

Total Agatston Score: 0.  No visible coronary artery calcifications.

[HOSPITAL] percentile:  0

OTHER FINDINGS:

Cardiovascular: Heart is normal size. Visualized aorta normal
caliber.

Mediastinum/Nodes: No adenopathy in the lower mediastinum or hila.

Lungs/Pleura: No confluent opacities or effusions.

Upper Abdomen: Imaging into the upper abdomen shows no acute
findings.

Musculoskeletal: Chest wall soft tissues are unremarkable. No acute
bony abnormality.
IMPRESSION: No visible coronary artery calcifications. Total coronary calcium
score of 0.

No acute or significant extracardiac abnormality.

## 2018-03-05 ENCOUNTER — Other Ambulatory Visit: Payer: Self-pay | Admitting: Obstetrics & Gynecology

## 2018-03-05 DIAGNOSIS — R928 Other abnormal and inconclusive findings on diagnostic imaging of breast: Secondary | ICD-10-CM

## 2018-03-11 ENCOUNTER — Ambulatory Visit
Admission: RE | Admit: 2018-03-11 | Discharge: 2018-03-11 | Disposition: A | Discharge status: Home / Self Care | Payer: Medicare Other | Source: Ambulatory Visit | Attending: Obstetrics & Gynecology | Admitting: Obstetrics & Gynecology

## 2018-03-11 ENCOUNTER — Other Ambulatory Visit: Payer: Self-pay | Admitting: Obstetrics & Gynecology

## 2018-03-11 DIAGNOSIS — N6320 Unspecified lump in the left breast, unspecified quadrant: Secondary | ICD-10-CM

## 2018-03-11 DIAGNOSIS — R928 Other abnormal and inconclusive findings on diagnostic imaging of breast: Secondary | ICD-10-CM

## 2018-03-11 IMAGING — MG DIGITAL DIAGNOSTIC UNILATERAL LEFT MAMMOGRAM WITH TOMO AND CAD
4 series · 4 of 12 positions shown · non-contrast
Comparison: Previous exam(s).

CLINICAL DATA: Screening recall for a left breast asymmetry.

EXAM:
DIGITAL DIAGNOSTIC LEFT MAMMOGRAM WITH CAD AND TOMO
ULTRASOUND LEFT BREAST

[L MLO synth-2D]
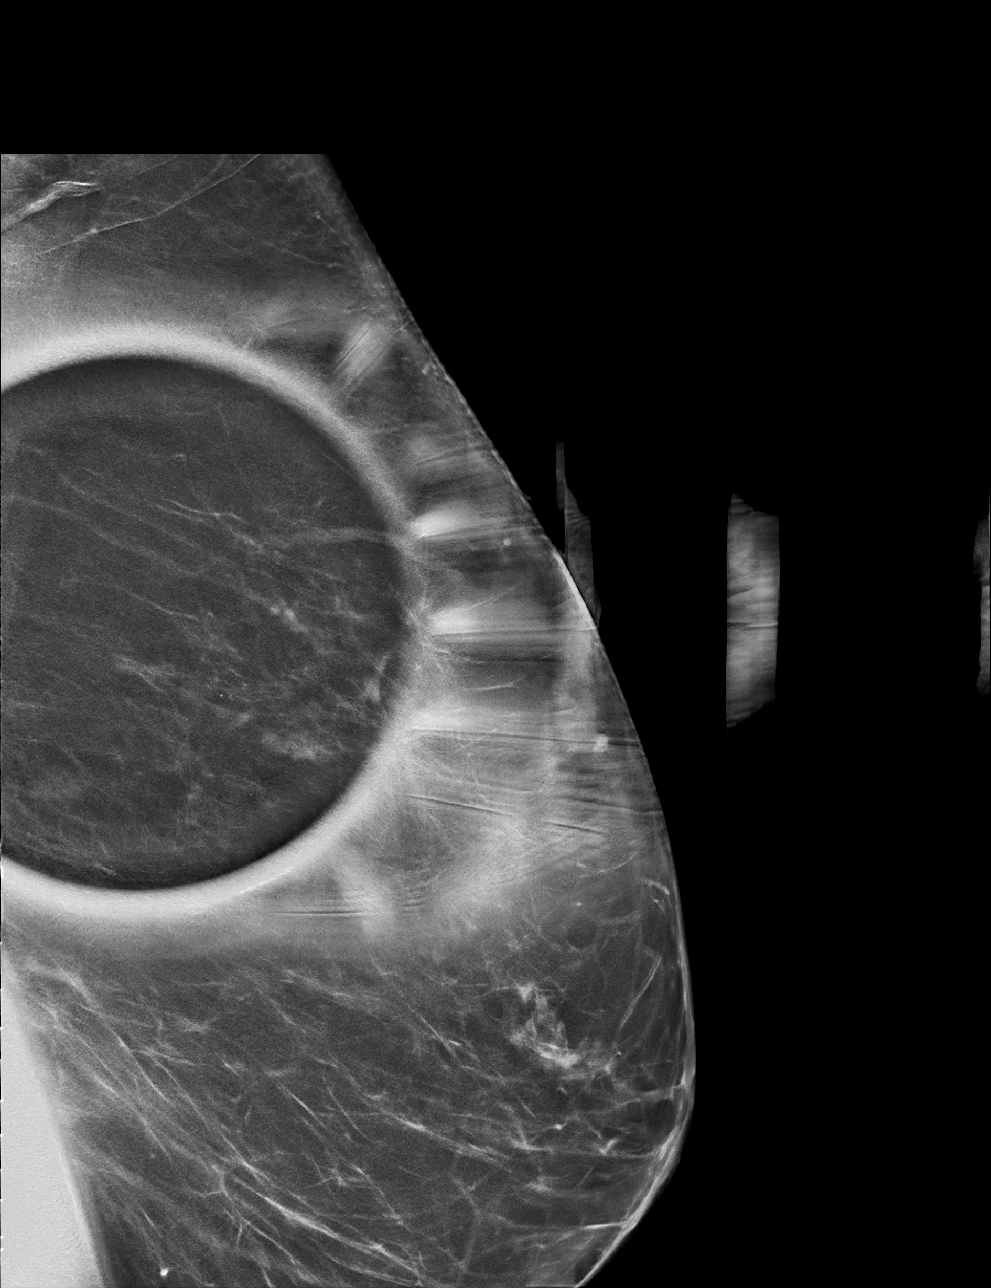

[L ML synth-2D]
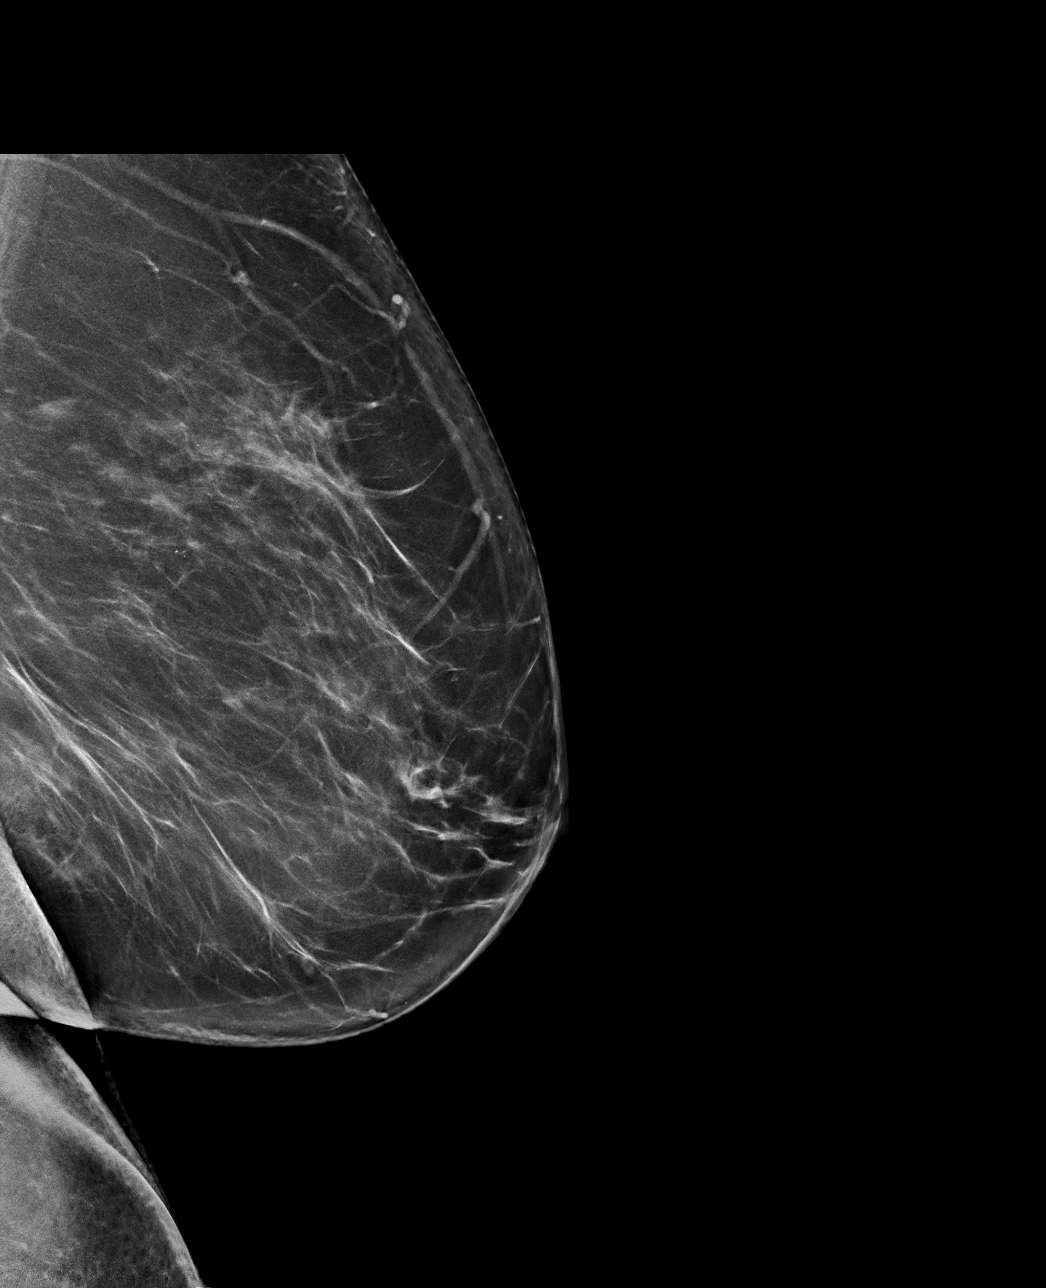

[L MLO tomo · tomo slice 45/88.0]
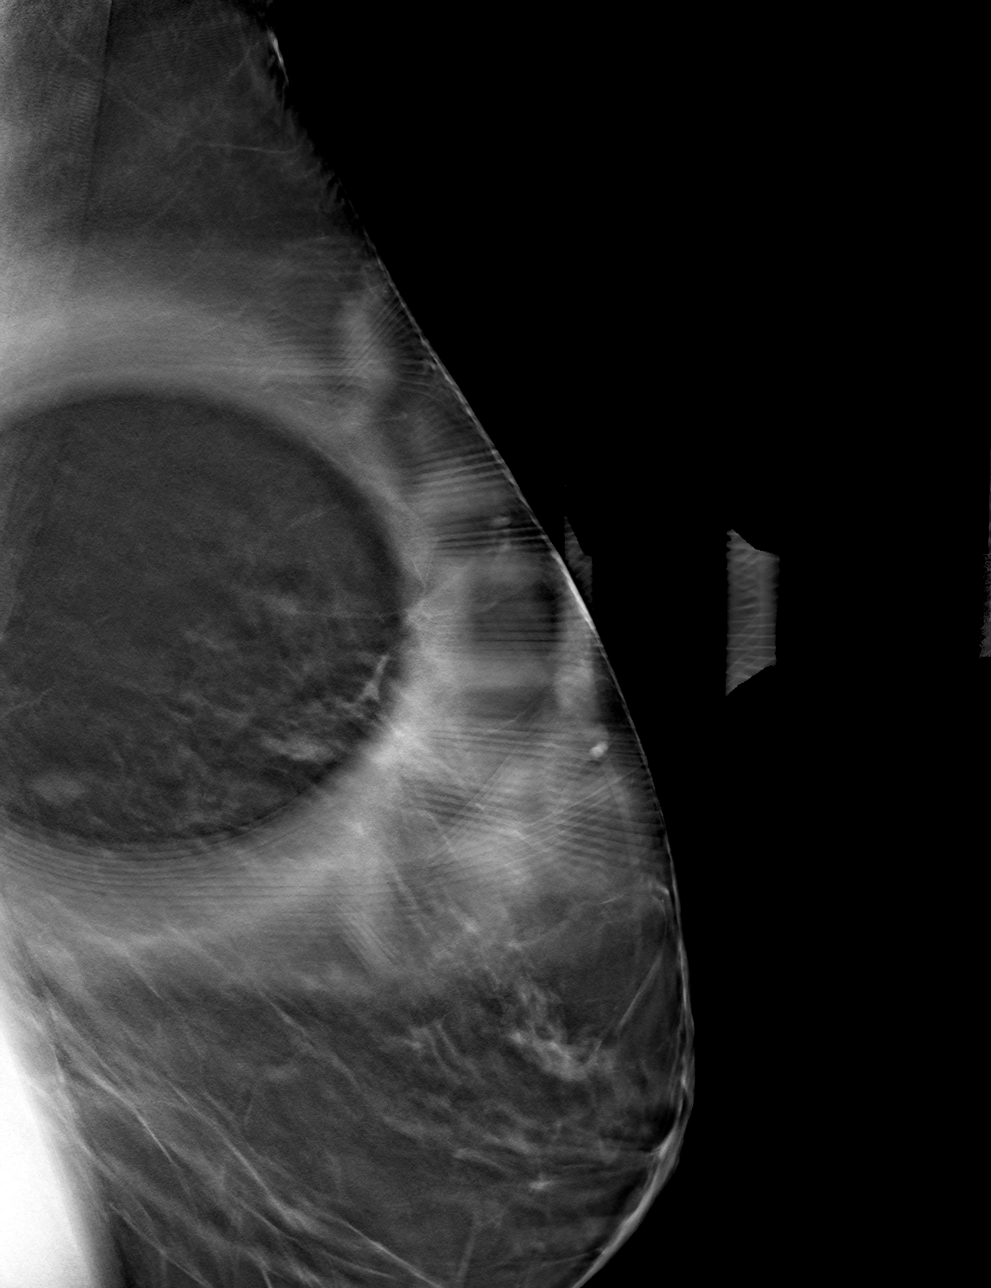

[L ML tomo · tomo slice 44/87.0]
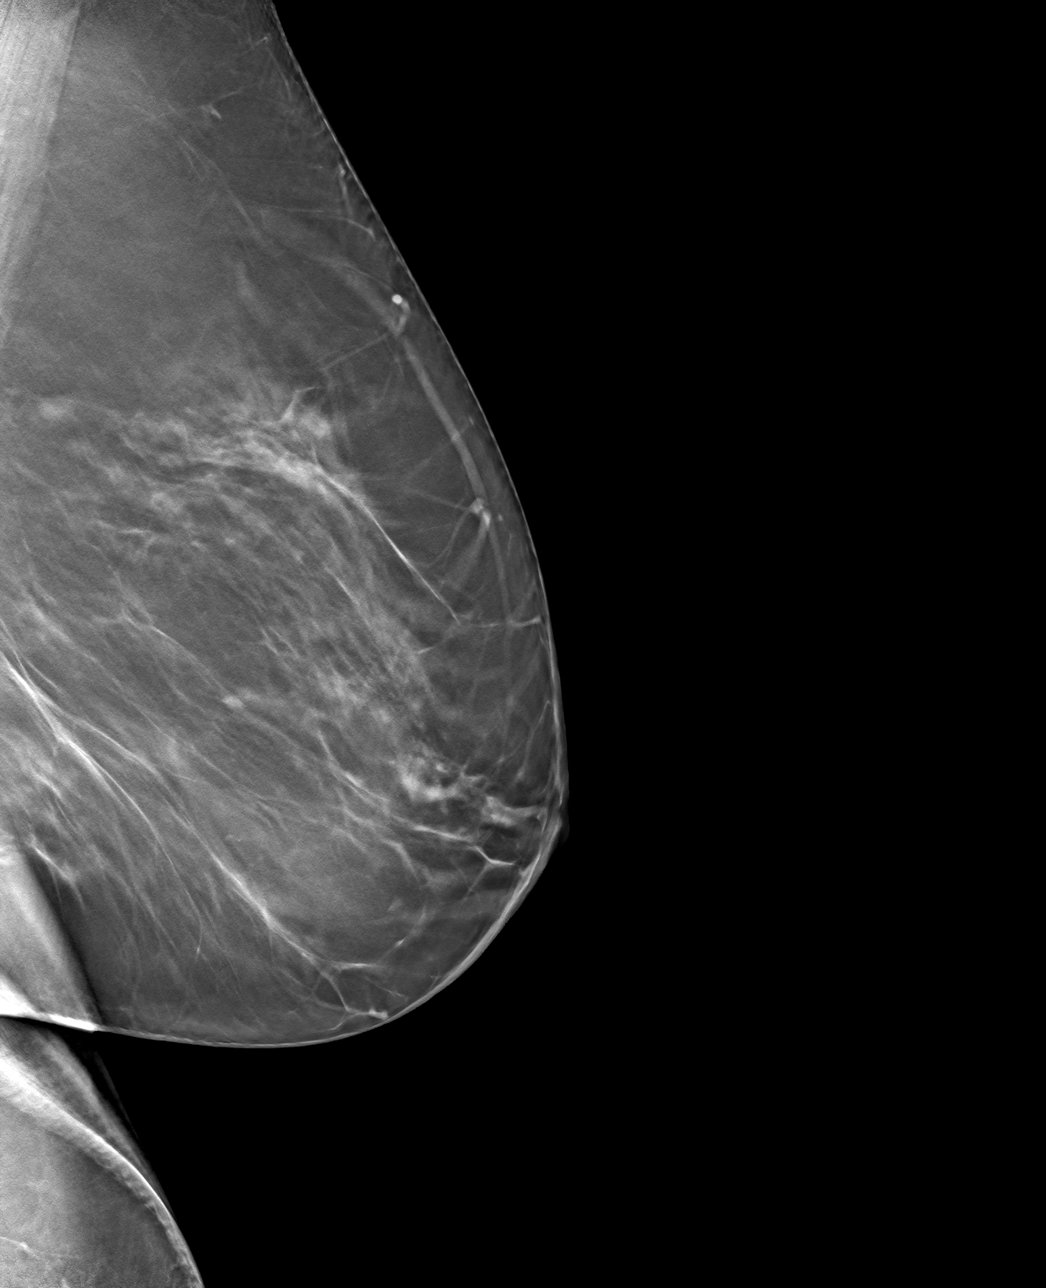

[4 of 12 positions shown; findings below may reference images not displayed]

ACR Breast Density Category b: There are scattered areas of
fibroglandular density.
FINDINGS: Spot compression tomosynthesis images through the region of concern
in the superior left breast demonstrates a persistent asymmetry
measuring approximately 9 mm. There is no associated distortion or
suspicious calcifications.

Mammographic images were processed with CAD.

Ultrasound of the left breast at 11 o'clock, 7 cm from the nipple
demonstrates an irregular hypoechoic mass measuring 6 x 4 x 5 mm.
Ultrasound of the left axilla demonstrates multiple normal-appearing
lymph nodes.
IMPRESSION: 1. There is an indeterminate left breast mass at 11 o'clock. This
may correspond with the asymmetry identified mammographically.

2.  No evidence of left axillary lymphadenopathy.

RECOMMENDATION:
Ultrasound guided biopsy is recommended for the left breast mass at
11 o'clock. This has been scheduled for [DATE] at [DATE] a.m.

I have discussed the findings and recommendations with the patient.
Results were also provided in writing at the conclusion of the
visit. If applicable, a reminder letter will be sent to the patient
regarding the next appointment.

BI-RADS CATEGORY  4: Suspicious.

## 2018-03-11 IMAGING — US ULTRASOUND LEFT BREAST LIMITED
1 series · 9 of 9 positions shown · non-contrast
Comparison: Previous exam(s).

CLINICAL DATA: Screening recall for a left breast asymmetry.

EXAM:
DIGITAL DIAGNOSTIC LEFT MAMMOGRAM WITH CAD AND TOMO
ULTRASOUND LEFT BREAST

[Series 1: ultrasound left breast limited · 0.08mm/px · 9 of 9 slices shown]
[im 1/9]
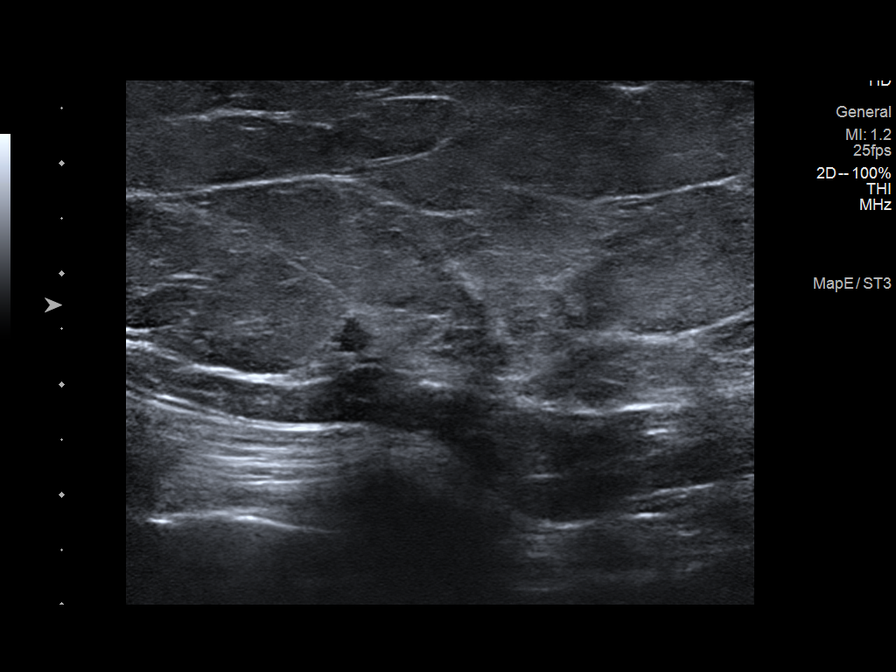
[im 2/9]
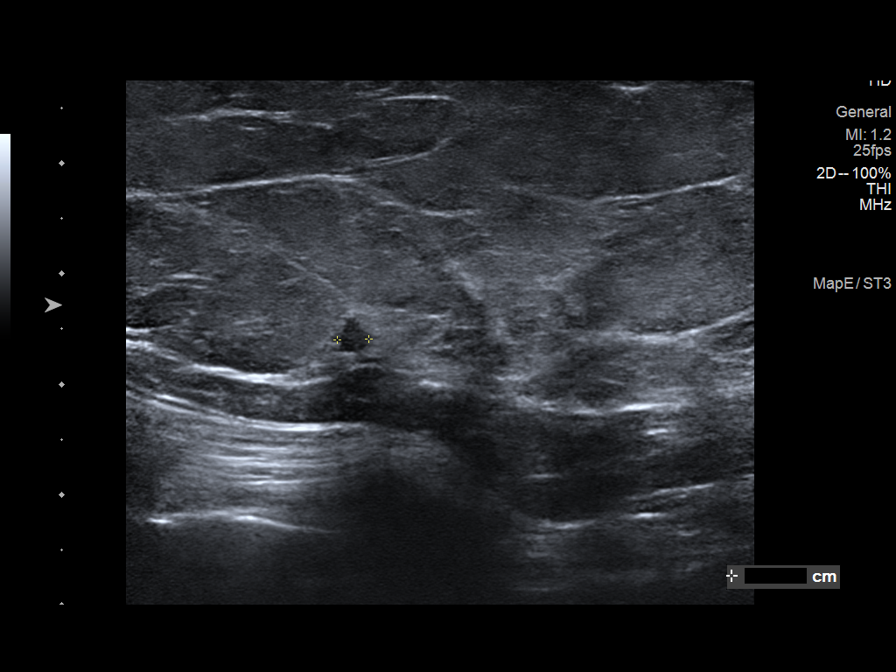
[im 3/9]
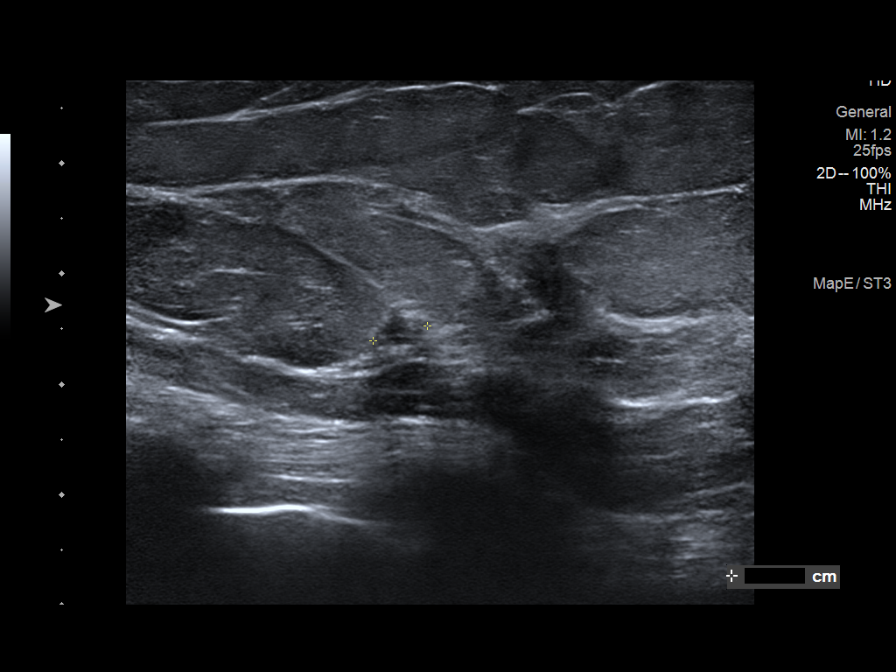
[im 4/9]
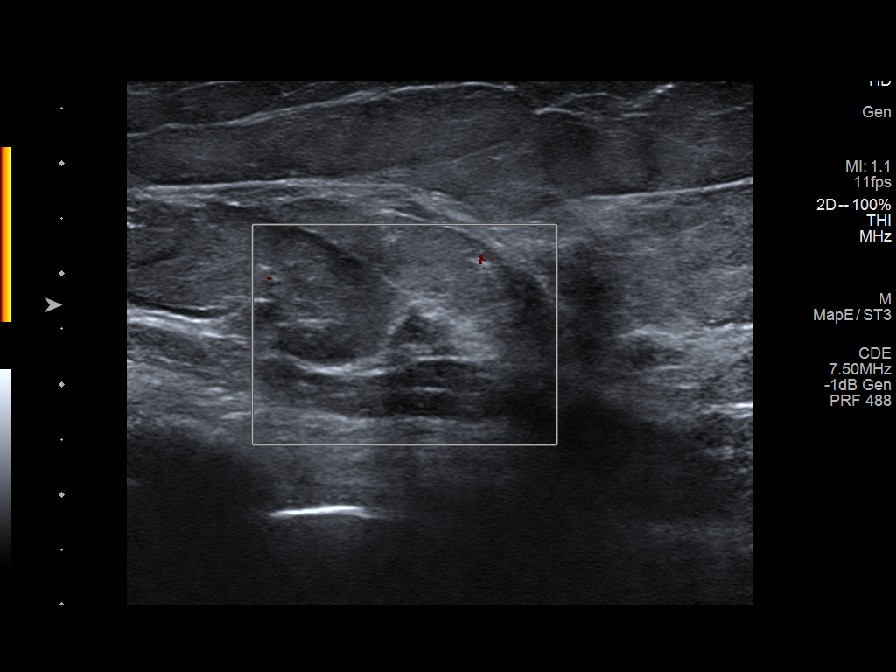
[im 5/9]
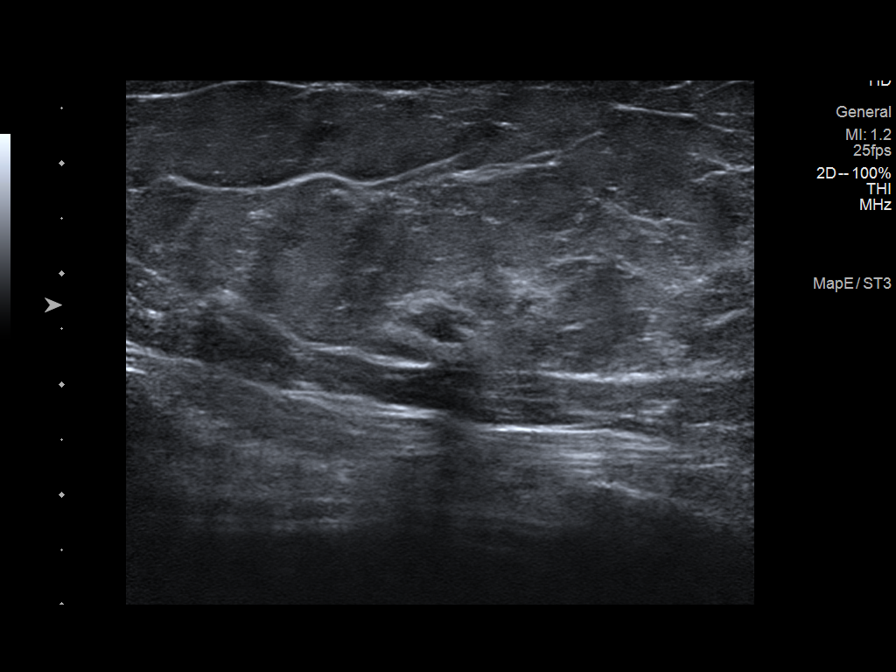
[im 6/9]
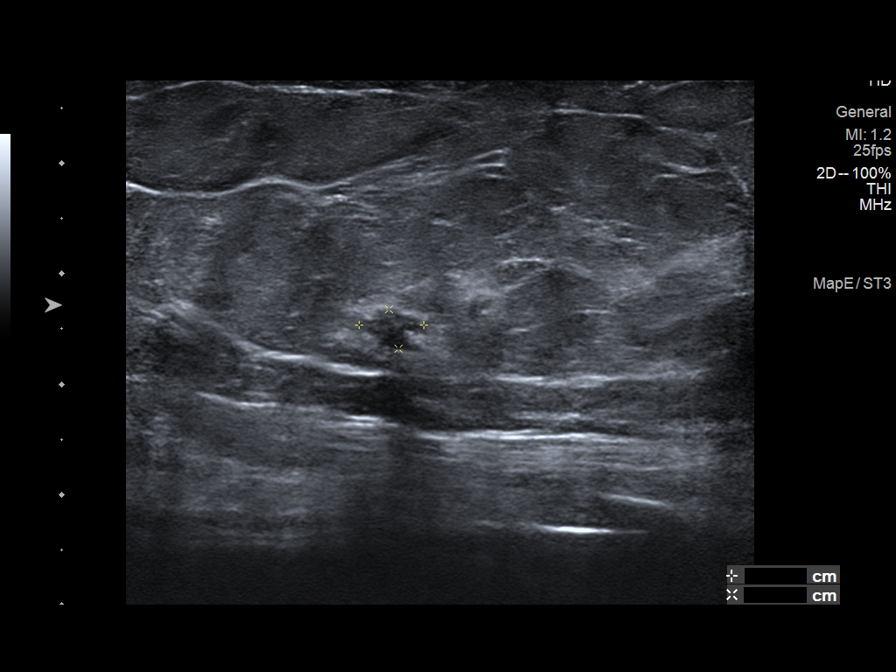
[im 7/9]
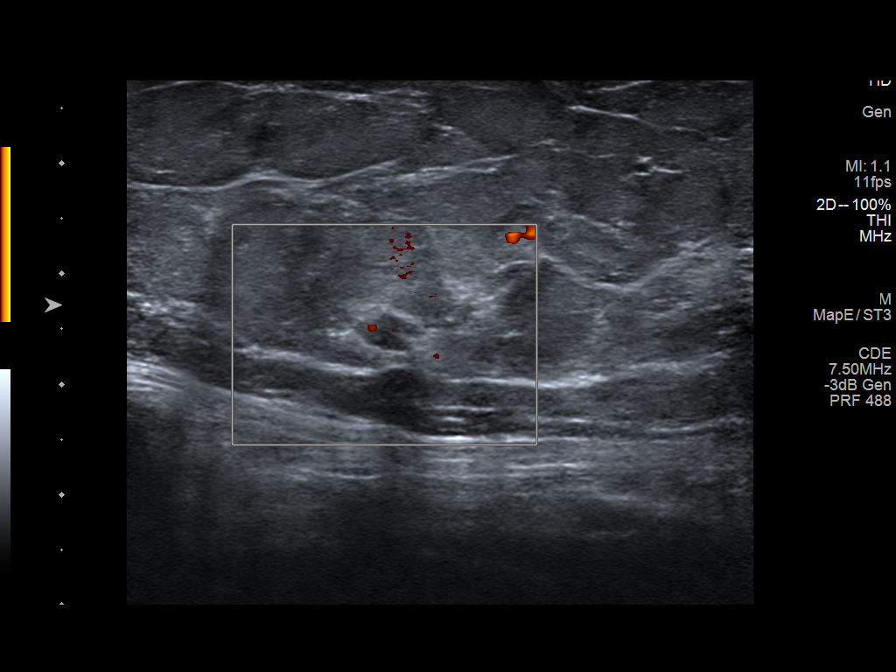
[im 8/9]
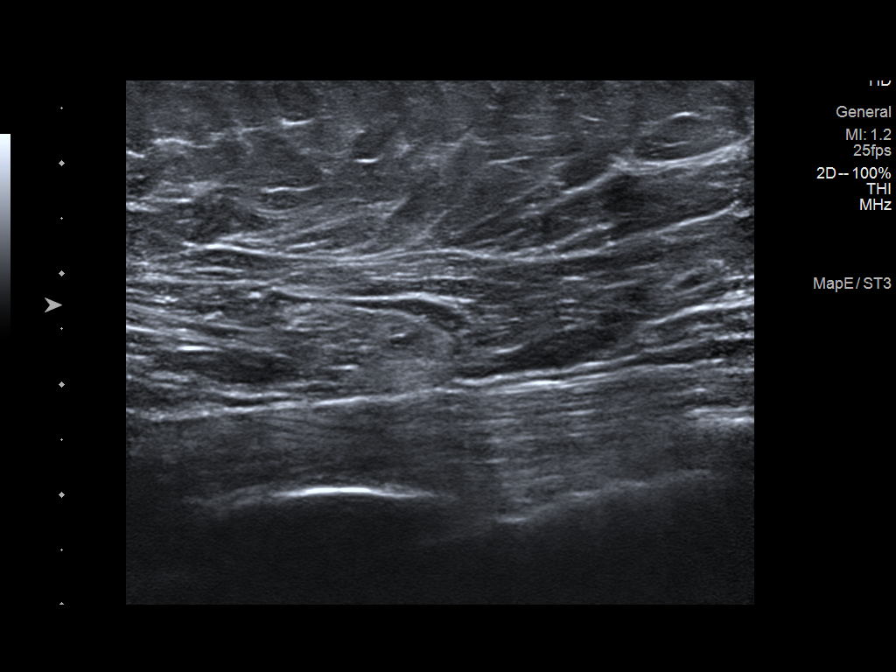
[im 9/9]
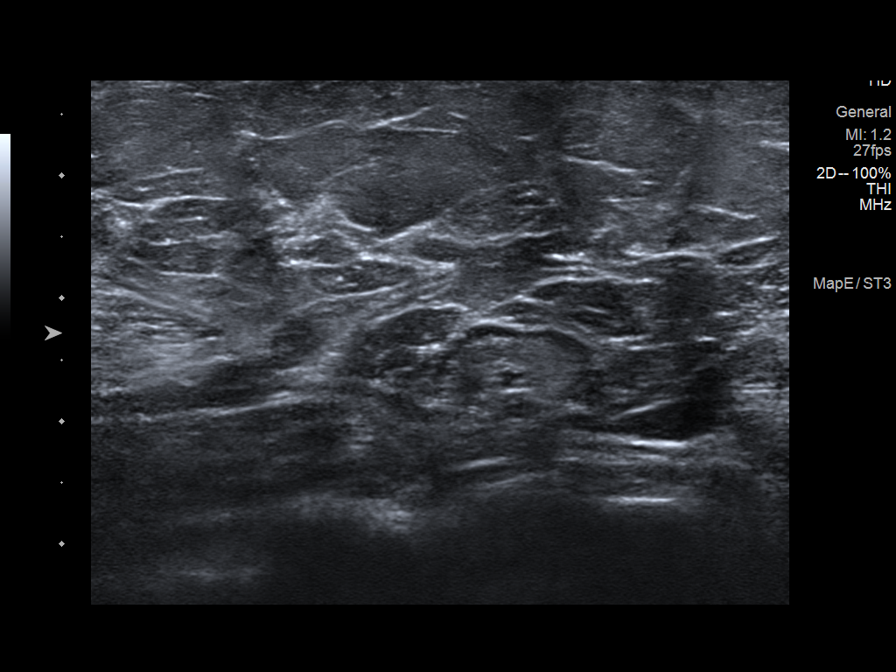

[9 of 9 positions shown; findings below may reference images not displayed]

ACR Breast Density Category b: There are scattered areas of
fibroglandular density.
FINDINGS: Spot compression tomosynthesis images through the region of concern
in the superior left breast demonstrates a persistent asymmetry
measuring approximately 9 mm. There is no associated distortion or
suspicious calcifications.

Mammographic images were processed with CAD.

Ultrasound of the left breast at 11 o'clock, 7 cm from the nipple
demonstrates an irregular hypoechoic mass measuring 6 x 4 x 5 mm.
Ultrasound of the left axilla demonstrates multiple normal-appearing
lymph nodes.
IMPRESSION: 1. There is an indeterminate left breast mass at 11 o'clock. This
may correspond with the asymmetry identified mammographically.

2.  No evidence of left axillary lymphadenopathy.

RECOMMENDATION:
Ultrasound guided biopsy is recommended for the left breast mass at
11 o'clock. This has been scheduled for [DATE] at [DATE] a.m.

I have discussed the findings and recommendations with the patient.
Results were also provided in writing at the conclusion of the
visit. If applicable, a reminder letter will be sent to the patient
regarding the next appointment.

BI-RADS CATEGORY  4: Suspicious.

## 2018-03-12 ENCOUNTER — Ambulatory Visit
Admission: RE | Admit: 2018-03-12 | Discharge: 2018-03-12 | Disposition: A | Discharge status: Home / Self Care | Payer: Medicare Other | Source: Ambulatory Visit | Attending: Obstetrics & Gynecology | Admitting: Obstetrics & Gynecology

## 2018-03-12 ENCOUNTER — Other Ambulatory Visit: Payer: Self-pay

## 2018-03-12 DIAGNOSIS — N6320 Unspecified lump in the left breast, unspecified quadrant: Secondary | ICD-10-CM

## 2018-03-12 IMAGING — MG US BREAST BX W LOC DEV 1ST LESION IMG BX SPEC US GUIDE*L*
1 series · 8 of 8 positions shown · non-contrast
Comparison: Previous exam(s).
COMPARISON: Previous exam(s).

Addendum:
CLINICAL DATA: Indeterminate left breast mass.

EXAM:
ULTRASOUND GUIDED LEFT BREAST CORE NEEDLE BIOPSY

[Series 1: MG view · 0.07mm/px · 8 of 8 slices shown]
[im 1/8]
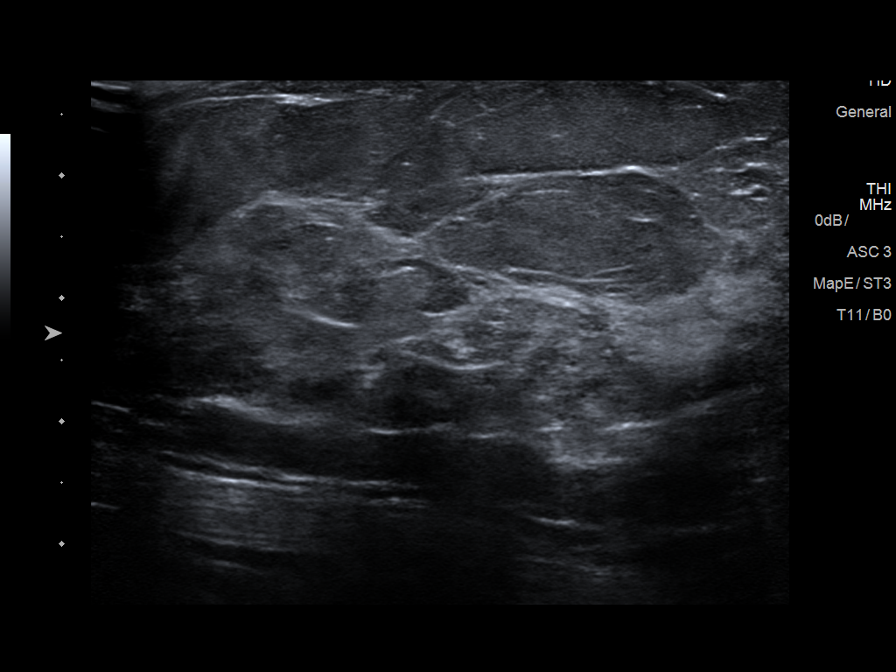
[im 2/8]
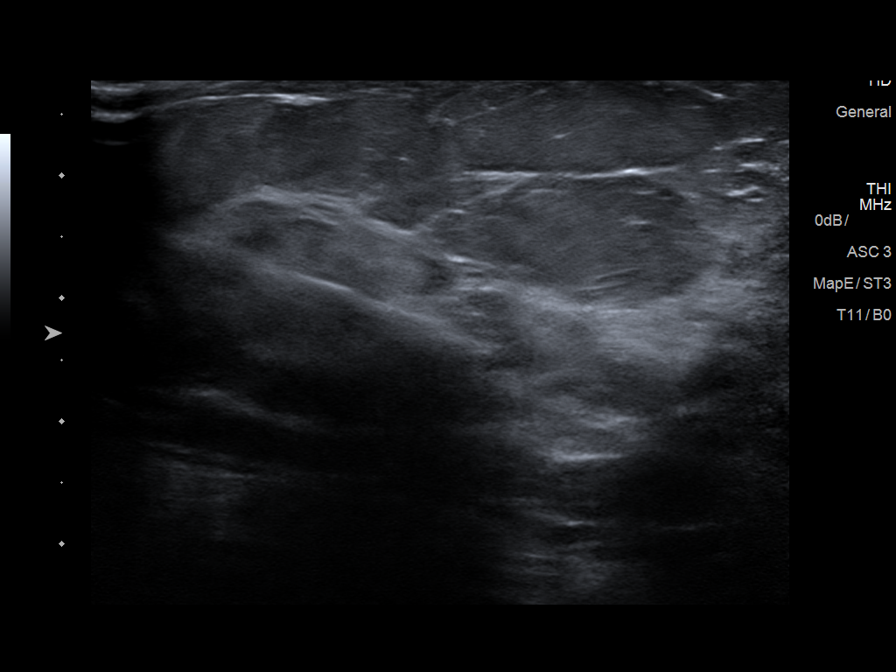
[im 3/8]
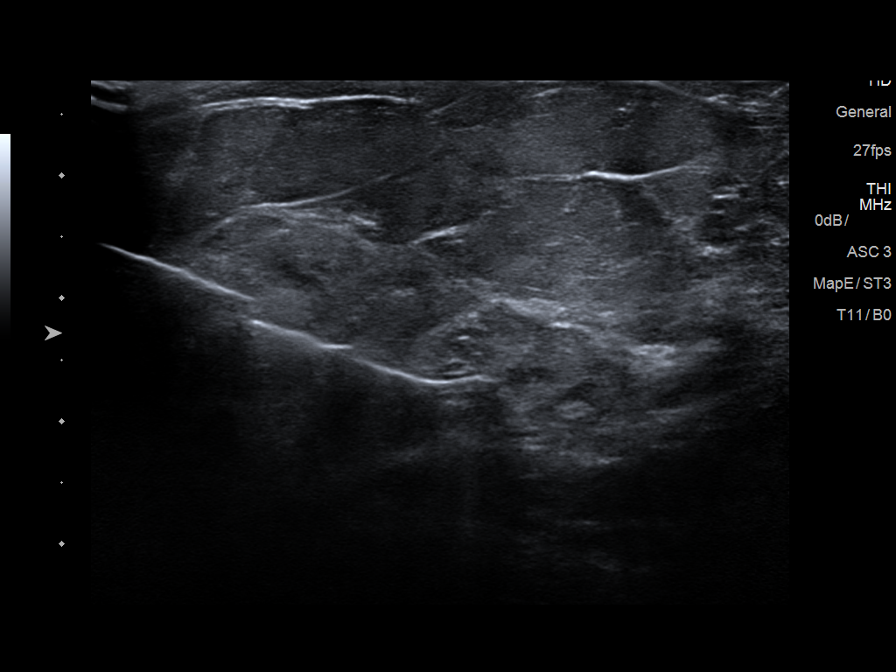
[im 4/8]
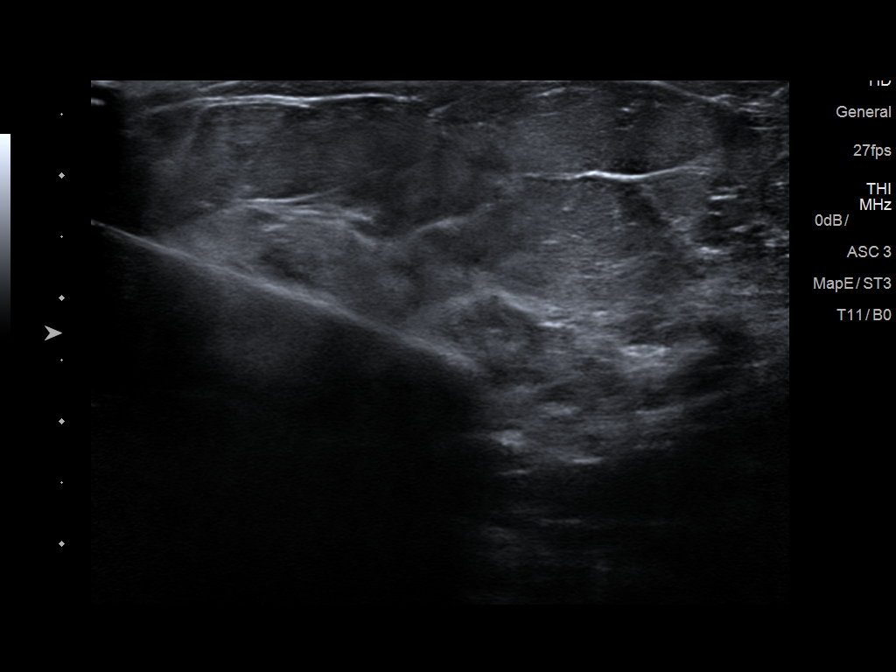
[im 5/8]
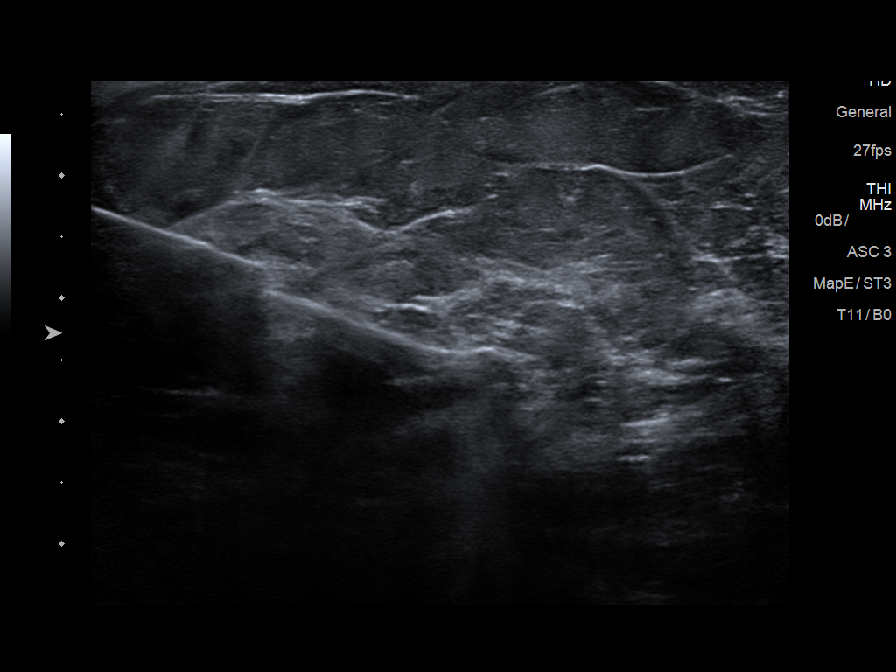
[im 6/8]
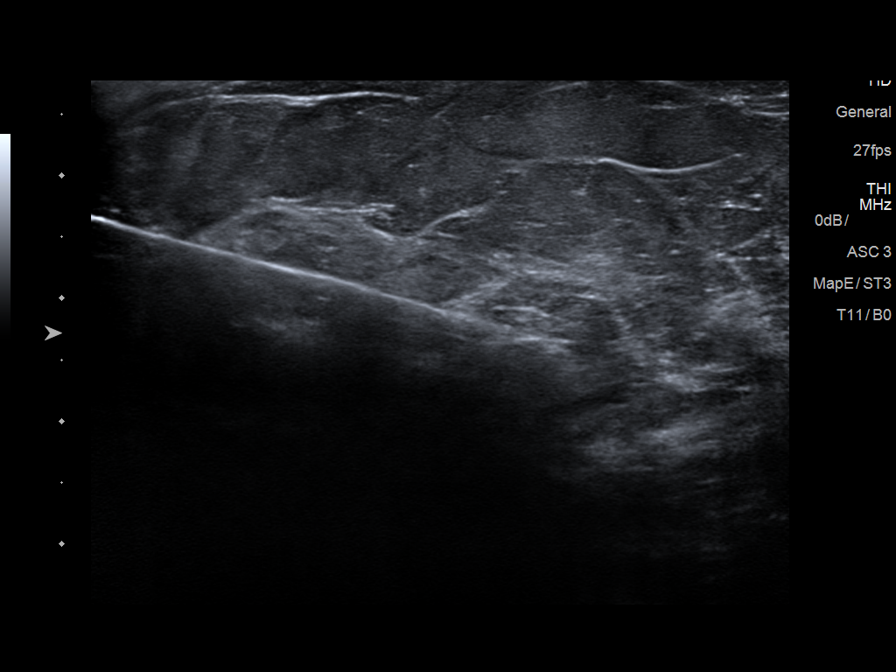
[im 7/8]
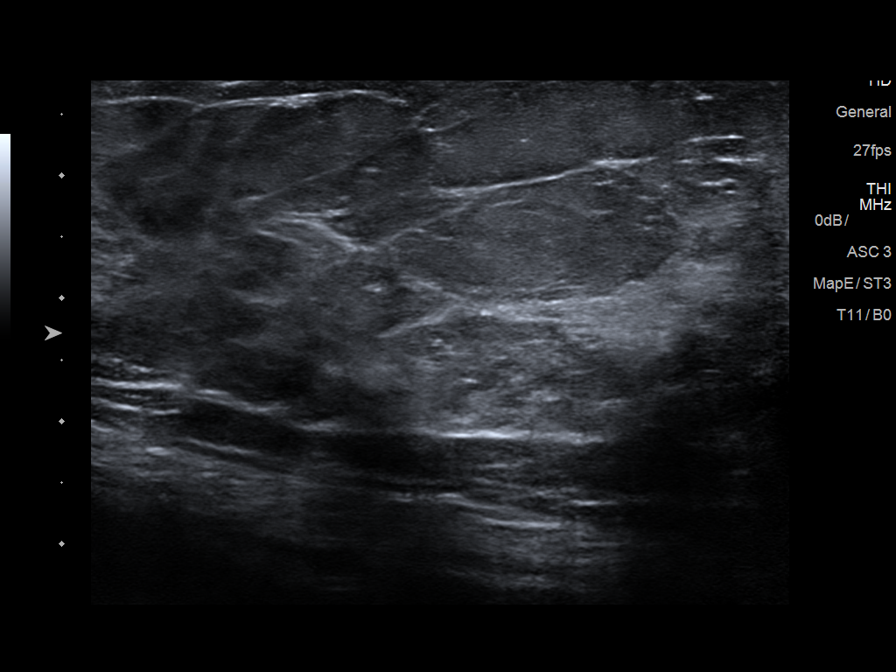
[im 8/8]
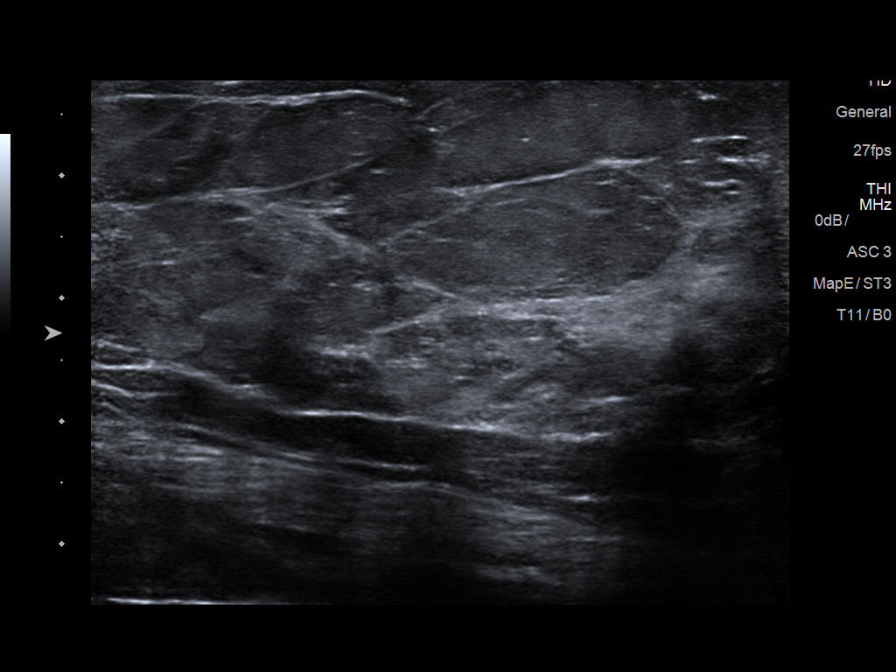

[8 of 8 positions shown; findings below may reference images not displayed]



Lesion quadrant: Upper inner quadrant

Using sterile technique and 1% Lidocaine as local anesthetic, under
direct ultrasound visualization, a 12 gauge GADOURE device was
used to perform biopsy of left breast mass 11 o'clock position using
a medial approach. At the conclusion of the procedure a ribbon
shaped tissue marker clip was deployed into the biopsy cavity.
Follow up 2 view mammogram was performed and dictated separately.
IMPRESSION: Ultrasound guided biopsy of left breast mass 11 o'clock position. No
apparent complications.

ADDENDUM:
Pathology revealed FLAT EPITHELIAL ATYPIA WITH BACKGROUND COLUMNAR
CELL HYPERPLASIA of the LEFT breast, 11 o'clock. This was found to
be discordant by Dr. GADOURE, with excision recommended.

Pathology results were discussed with the patient by telephone. The
patient reported doing well after the biopsy with tenderness at the
site. Post biopsy instructions and care were reviewed and questions
were answered. The patient was encouraged to call The [REDACTED]

Surgical consultation has been arranged with Dr. GADOURE at [REDACTED] on [DATE].

Pathology results reported by GADOURE, RN on [DATE].



Lesion quadrant: Upper inner quadrant

Using sterile technique and 1% Lidocaine as local anesthetic, under
direct ultrasound visualization, a 12 gauge GADOURE device was
used to perform biopsy of left breast mass 11 o'clock position using
a medial approach. At the conclusion of the procedure a ribbon
shaped tissue marker clip was deployed into the biopsy cavity.
Follow up 2 view mammogram was performed and dictated separately.
IMPRESSION: Ultrasound guided biopsy of left breast mass 11 o'clock position. No
apparent complications.

## 2018-03-12 IMAGING — MG MM CLIP PLACEMENT
2 series · 2 of 2 positions shown · non-contrast
Comparison: Previous exam(s).

CLINICAL DATA: Status post ultrasound-guided biopsy left breast

EXAM:
DIAGNOSTIC LEFT MAMMOGRAM POST ULTRASOUND BIOPSY

[L CC]
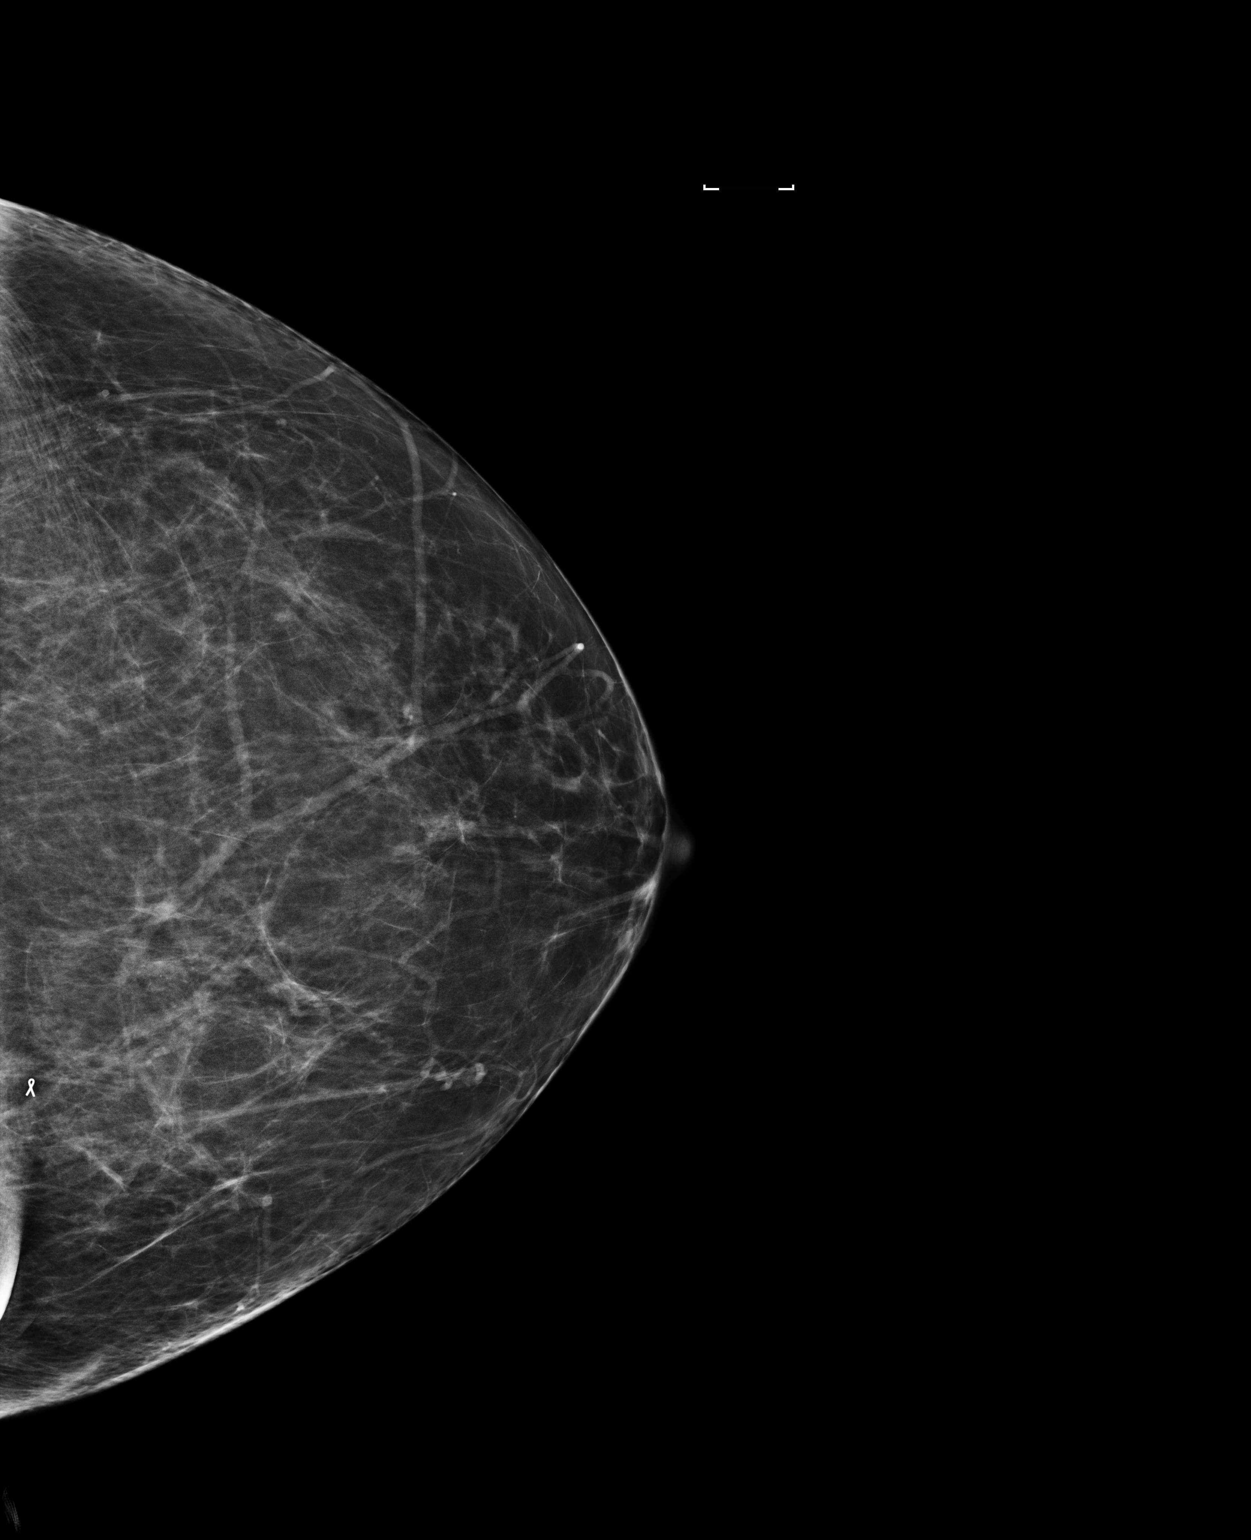

[L ML]
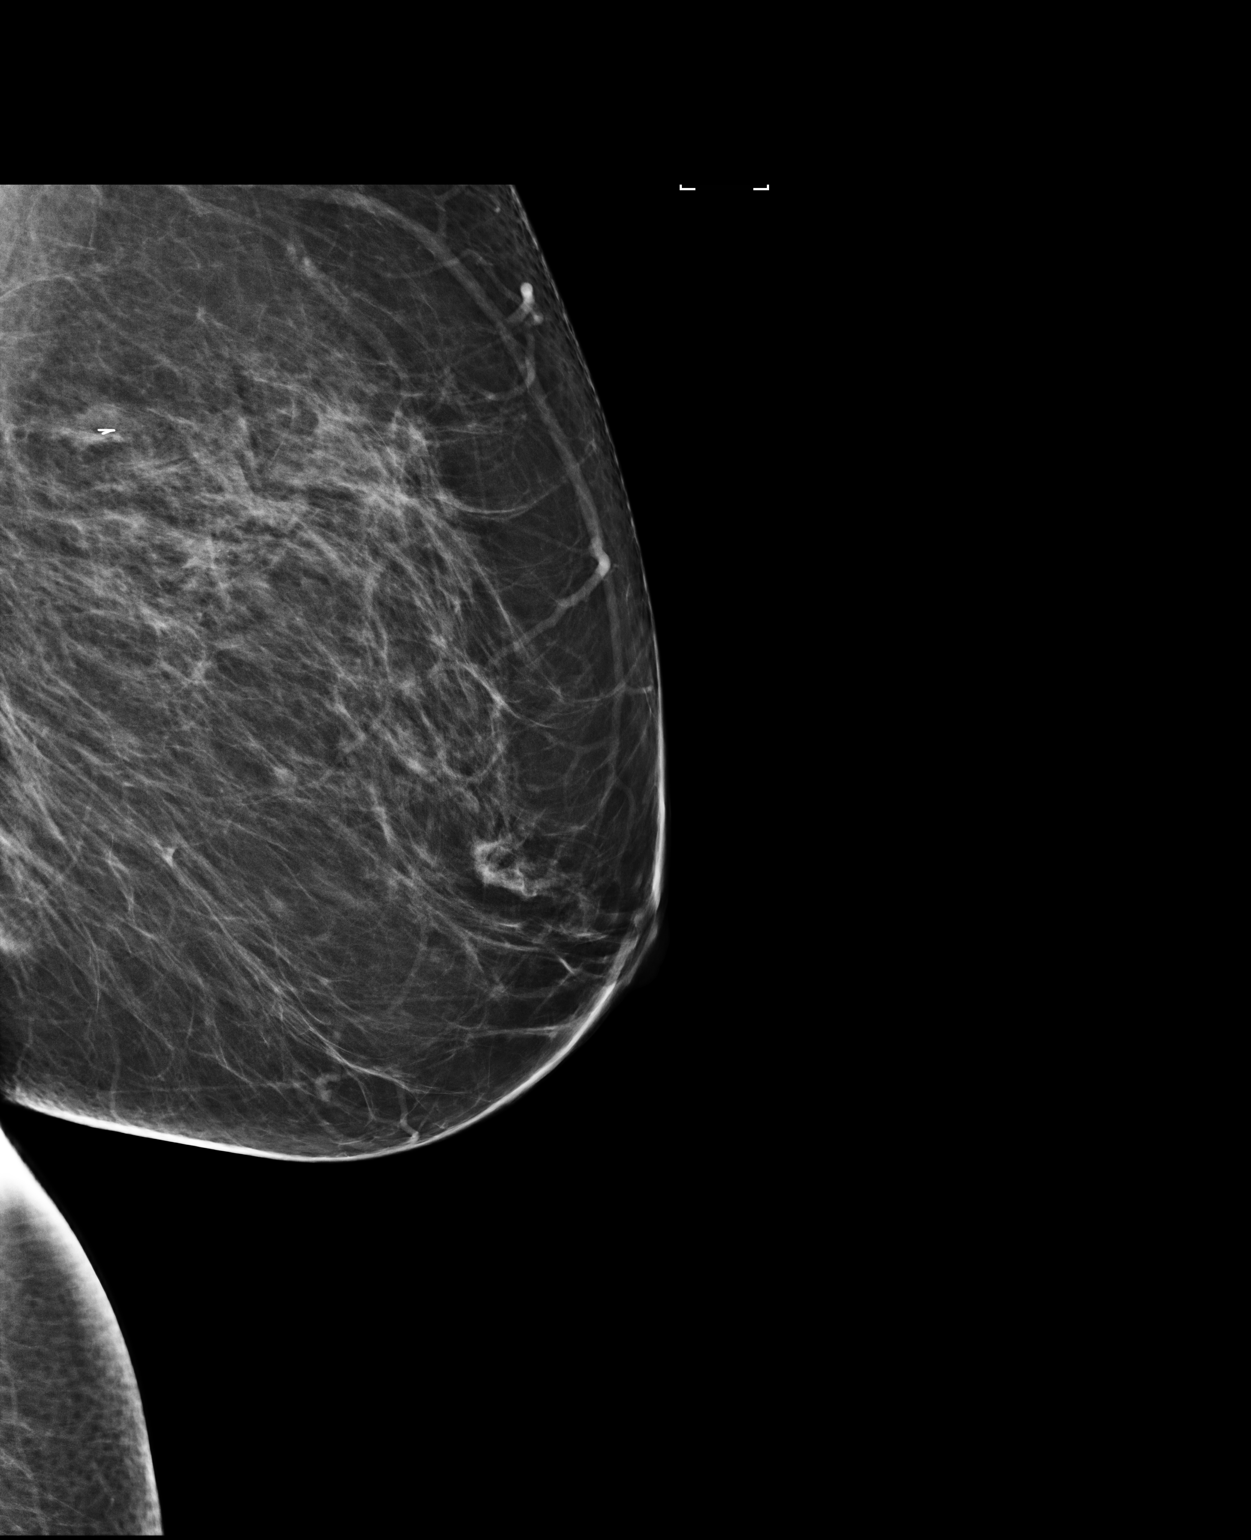

[2 of 2 positions shown; findings below may reference images not displayed]

FINDINGS: Mammographic images were obtained following ultrasound guided biopsy
of left breast. Ribbon shaped marking clip appropriate position.
IMPRESSION: Appropriate position ribbon shaped marking clip status post
ultrasound-guided biopsy left breast mass.

Final Assessment: Post Procedure Mammograms for Marker Placement

## 2018-03-19 ENCOUNTER — Other Ambulatory Visit: Payer: Self-pay | Admitting: Internal Medicine

## 2018-03-19 DIAGNOSIS — R1011 Right upper quadrant pain: Secondary | ICD-10-CM

## 2018-03-20 ENCOUNTER — Ambulatory Visit
Admission: RE | Admit: 2018-03-20 | Discharge: 2018-03-20 | Disposition: A | Discharge status: Home / Self Care | Payer: Medicare Other | Source: Ambulatory Visit | Attending: Internal Medicine | Admitting: Internal Medicine

## 2018-03-20 DIAGNOSIS — R1011 Right upper quadrant pain: Secondary | ICD-10-CM

## 2018-03-27 ENCOUNTER — Ambulatory Visit: Payer: Self-pay | Admitting: General Surgery

## 2018-03-27 DIAGNOSIS — N6082 Other benign mammary dysplasias of left breast: Secondary | ICD-10-CM

## 2018-05-02 HISTORY — PX: BREAST LUMPECTOMY: SHX2

## 2018-05-12 ENCOUNTER — Other Ambulatory Visit: Payer: Self-pay | Admitting: General Surgery

## 2018-05-12 DIAGNOSIS — N6082 Other benign mammary dysplasias of left breast: Secondary | ICD-10-CM

## 2018-05-21 NOTE — Pre-Procedure Instructions (Signed)
[name]  05/21/2018     [id number] - [place], [address] AT [place] [address] Phone: [phone] Fax: [phone]  [id number] [place], [place] - [address] AT [address] [place] [address] Phone: [phone] Fax: [phone]   Your procedure is scheduled on Friday, May 29th  Report to [facility] Entrance A at 11:00 A.M.  Call this number if you have problems the morning of surgery:  [phone]   Remember:  Do not eat after midnight.  You may drink clear liquids until 10:00 A.M. Clear liquids allowed are:  Water, Juice (non-citric and without pulp), Carbonated beverages, Clear Tea, Black Coffee only, Plain Jell-O only, Gatorade and Plain Popsicles only    Take these medicines the morning of surgery with A SIP OF WATER Estradiol famotidine (PEPCID) SPIRIVA HANDIHALER   If needed - albuterol (PROAIR HFA), SYSTANE   *bring inhaler with you the day of surgery   Follow your surgeon's instructions on when to stop Aspirin.  If no instructions were given by your surgeon then you will need to call the office to get those instructions.    7 days prior to surgery STOP taking any Aspirin (unless otherwise instructed by your surgeon), Aleve, Naproxen, Ibuprofen, Motrin, Advil, Goody's, BC's, all herbal medications, fish oil, and all vitamins.   Do not wear jewelry, make-up or nail polish.  Do not wear lotions, powders, or perfumes, or deodorant.  Do not shave 48 hours prior to surgery.   Do not bring valuables to the hospital.  [facility] is not responsible for any belongings or valuables.  Contacts, dentures or bridgework may not be worn into surgery.  Leave your suitcase in the car.  After surgery it may be brought to your room.  For patients admitted to the hospital, discharge time will be  determined by your treatment team.  Patients discharged the day of surgery will not be allowed to drive home.   Special instructions:   [facility]- Preparing For Surgery  Before surgery, you can play an important role. Because skin is not sterile, your skin needs to be as free of germs as possible. You can reduce the number of germs on your skin by washing with CHG (chlorahexidine gluconate) Soap before surgery.  CHG is an antiseptic cleaner which kills germs and bonds with the skin to continue killing germs even after washing.    Oral Hygiene is also important to reduce your risk of infection.  Remember - BRUSH YOUR TEETH THE MORNING OF SURGERY WITH YOUR REGULAR TOOTHPASTE  Please do not use if you have an allergy to CHG or antibacterial soaps. If your skin becomes reddened/irritated stop using the CHG.  Do not shave (including legs and underarms) for at least 48 hours prior to first CHG shower. It is OK to shave your face.  Please follow these instructions carefully.   1. Shower the NIGHT BEFORE SURGERY and the MORNING OF SURGERY with CHG.   2. If you chose to wash your hair, wash your hair first as usual with your normal shampoo.  3. After you shampoo, rinse your hair and body thoroughly to remove the shampoo.  4. Use CHG as you would any other liquid soap. You can apply CHG directly to the skin and wash gently with a scrungie or a clean washcloth.   5. Apply the  CHG Soap to your body ONLY FROM THE NECK DOWN.  Do not use on open wounds or open sores. Avoid contact with your eyes, ears, mouth and genitals (private parts). Wash Face and genitals (private parts)  with your normal soap.  6. Wash thoroughly, paying special attention to the area where your surgery will be performed.  7. Thoroughly rinse your body with warm water from the neck down.  8. DO NOT shower/wash with your normal soap after using and rinsing off the CHG Soap.  9. Pat yourself dry with a CLEAN TOWEL.  10. Wear  CLEAN PAJAMAS to bed the night before surgery, wear comfortable clothes the morning of surgery  11. Place CLEAN SHEETS on your bed the night of your first shower and DO NOT SLEEP WITH PETS.  Day of Surgery:  Do not apply any deodorants/lotions.  Please wear clean clothes to the hospital/surgery center.   Remember to brush your teeth WITH YOUR REGULAR TOOTHPASTE.  Please read over the following fact sheets that you were given. Pain Booklet, Coughing and Deep Breathing and Surgical Site Infection Prevention

## 2018-05-22 ENCOUNTER — Other Ambulatory Visit: Payer: Self-pay

## 2018-05-22 ENCOUNTER — Encounter (HOSPITAL_COMMUNITY)
Admission: RE | Admit: 2018-05-22 | Discharge: 2018-05-22 | Disposition: A | Discharge status: Home / Self Care | Payer: Medicare Other | Source: Ambulatory Visit | Attending: General Surgery | Admitting: General Surgery

## 2018-05-22 ENCOUNTER — Encounter (HOSPITAL_COMMUNITY): Payer: Self-pay

## 2018-05-22 DIAGNOSIS — Z01812 Encounter for preprocedural laboratory examination: Secondary | ICD-10-CM | POA: Insufficient documentation

## 2018-05-22 HISTORY — DX: Headache, unspecified: R51.9

## 2018-05-22 LAB — COMPREHENSIVE METABOLIC PANEL
ALT: 16 U/L (ref 0–44)
AST: 22 U/L (ref 15–41)
Albumin: 3.8 g/dL (ref 3.5–5.0)
Alkaline Phosphatase: 63 U/L (ref 38–126)
Anion gap: 7 (ref 5–15)
BUN: 11 mg/dL (ref 8–23)
CO2: 29 mmol/L (ref 22–32)
Calcium: 9.4 mg/dL (ref 8.9–10.3)
Chloride: 99 mmol/L (ref 98–111)
Creatinine, Ser: 0.95 mg/dL (ref 0.44–1.00)
GFR calc Af Amer: >60 mL/min (ref >60)
GFR calc non Af Amer: >60 mL/min (ref >60)
Glucose, Bld: 96 mg/dL (ref 70–99)
Potassium: 4 mmol/L (ref 3.5–5.1)
Sodium: 135 mmol/L (ref 135–145)
Total Bilirubin: 0.5 mg/dL (ref 0.3–1.2)
Total Protein: 7.2 g/dL (ref 6.5–8.1)

## 2018-05-22 LAB — CBC
HCT: 37.3 % (ref 36.0–46.0)
Hemoglobin: 12.1 g/dL (ref 12.0–15.0)
MCH: 29.4 pg (ref 26.0–34.0)
MCHC: 32.4 g/dL (ref 30.0–36.0)
MCV: 90.8 fL (ref 80.0–100.0)
Platelets: 361 10*3/uL (ref 150–400)
RBC: 4.11 MIL/uL (ref 3.87–5.11)
RDW: 12.3 % (ref 11.5–15.5)
WBC: 7.9 10*3/uL (ref 4.0–10.5)
nRBC: 0 % (ref 0.0–0.2)

## 2018-05-22 NOTE — Progress Notes (Signed)
PCP - Dr. Crist Infante Cardiologist - currently denies, however patient stated "saw Dr. Larae Grooms for an echo"  Chest x-ray - denies  EKG - denies Stress Test - pt unsure, no results found ECHO - 12/08/2009 Cardiac Cath - denies  Sleep Study - denies CPAP - N/A  Blood Thinner Instructions: N/A Aspirin Instructions: N/A  Anesthesia review: YES, echo   Coronavirus Screening  Have you experienced the following symptoms:  Cough yes/no: No Fever (>100.90F)  yes/no: No Runny nose yes/no: No Sore throat yes/no: No Difficulty breathing/shortness of breath  yes/no: No  Have you or a family member traveled in the last 14 days and where? yes/no: No  If the patient indicates "YES" to the above questions, their PAT will be rescheduled to limit the exposure to others and, the surgeon will be notified. THE PATIENT WILL NEED TO BE ASYMPTOMATIC FOR 14 DAYS.   If the patient is not experiencing any of these symptoms, the PAT nurse will instruct them to NOT bring anyone with them to their appointment since they may have these symptoms or traveled as well.   Please remind your patients and families that hospital visitation restrictions are in effect and the importance of the restrictions.   Patient denies shortness of breath, fever, cough and chest pain at PAT appointment  Patient verbalized understanding of instructions that were given to them at the PAT appointment. Patient was also instructed that they will need to review over the PAT instructions again at home before surgery.

## 2018-05-23 ENCOUNTER — Encounter (HOSPITAL_COMMUNITY): Payer: Self-pay

## 2018-05-23 NOTE — Progress Notes (Addendum)
Anesthesia Chart Review:  Case:  [id number] Date/Time:  05/30/18 1245   Procedures:      LEFT BREAST LUMPECTOMY WITH RADIOACTIVE SEED LOCALIZATION (Left Breast)     LAPAROSCOPIC CHOLECYSTECTOMY POSSIBLE OPEN WITH INTRAOPERATIVE CHOLANGIOGRAM (N/A )   Anesthesia type:  General   Pre-op diagnosis:  CHOLECYSTITIS W CHOLELITHIASIS, LEFT BREAST ATYPICAL DUCTAL HYPERPLASIA   Location:  [facility] OR ROOM 08 / [employer] OR   Surgeon:  Autumn Messing III, MD    She is scheduled for radioactive seed implant 05/29/18 and is scheduled for COVID 19 test on 05/27/18.   DISCUSSION: Patient is a 68 year old female scheduled for the above procedure.  History includes never smoker, anemia, GERD, asthma, palpitations (diagnosed ~ 2016; overall controlled with veraopamil), headaches, procedures for TMJ and pyloric stenosis. Calcium score of 0 12/2017.  Last EKGs done were in 2015 and 08/17/14 and showed NSR. The 08/17/14 was done during follow-up for palpitations by a previous PCP. Patient denied ever wearing an event monitor. She is now seeing Dr. Joylene Draft and no EKG available at [facility]. I called and spoke with patient. She reports intermittent palpitations for years, no real pattern. Last episode about 6 months ago and can last up to 30 minutes. Has had to take a second dose of verapamil once. Her heart will feel like it is racing, and she will feel tired during the spells. No syncope, chest pain, or SOB. Reports she can clean/vacuum, walk up 2 flights of stairs. 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   Given history of palpitations with on-going use of anti-arrhythmic would favor preoperative EKG. Discussed with anesthesiologist Renold Don, MD who is in agreement. Since EKG was not done at PAT, our staff will schedule a day for patient to come back in to PAT for EKG only prior to seed implant date.  ADDENDUM 05/27/18 9:45 AM: Patient's EKG on  05/27/18 showed NSR, possible LAE. Baseline wanderer in V1-V2. Based on currently available information, I would anticipate that she can proceed as planned. COVID preoperative test in in process.    VS: BP (!) 145/70   Pulse 71   Temp 36.7 C   Resp 20   Ht 5' 4.5" (1.638 m)   Wt 70.8 kg   SpO2 100%   BMI 26.36 kg/m    PROVIDERS: Crist Infante, MD is PCP   LABS: Labs reviewed: Acceptable for surgery. (all labs ordered are listed, but only abnormal results are displayed)  Labs Reviewed  COMPREHENSIVE METABOLIC PANEL  CBC    EKG: 05/27/18: Normal sinus rhythm Possible Left atrial enlargement Baseline wanderer in V1-V2 Borderline ECG   CV: CT Heart for Calcium scoring 12/12/17 (ordered by Dr. Joylene Draft, RE: HLD): IMPRESSION: No visible coronary artery calcifications. Total coronary calcium score of 0. No acute or significant extracardiac abnormality.  Echo 12/08/09: Study Conclusions Left ventricle: The cavity size was normal. Wall thickness was normal. Systolic function was normal. The estimated ejection fraction was in the range of 50% to 55%. Wall motion was normal; there were no regional wall motion abnormalities. Doppler parameters are consistent with abnormal left ventricular relaxation (grade 1 diastolic dysfunction).   Notes indicate she had a negative Cardiolite stress test in 2005.   Past Medical History:  Diagnosis Date  . Allergy   . Anemia   . Asthma   . Depression   . GERD (gastroesophageal reflux disease)   .  Headache   . Palpitations 2016   Cardiolite neg 2005, echo 2011 EF 55%    Past Surgical History:  Procedure Laterality Date  . ABDOMINAL HYSTERECTOMY    . BUNIONECTOMY    . CESAREAN SECTION    . pyloric stenosis     at 49 weeks old  . TEMPOROMANDIBULAR JOINT SURGERY    . VARICOSE VEIN SURGERY      MEDICATIONS: . polycarbophil (FIBERCON) 625 MG tablet  . acetaminophen (TYLENOL) 500 MG tablet  . ACZONE 7.5 % GEL  .  albuterol (PROAIR HFA) 108 (90 BASE) MCG/ACT inhaler  . Biotin 5000 MCG TABS  . Cholecalciferol (VITAMIN D-3) 125 MCG (5000 UT) TABS  . Cinnamon 500 MG capsule  . clobetasol (TEMOVATE) 0.05 % external solution  . conjugated estrogens (PREMARIN) vaginal cream  . estradiol (ESTRACE) 1 MG tablet  . famotidine (PEPCID) 20 MG tablet  . fexofenadine (ALLEGRA) 180 MG tablet  . Flaxseed, Linseed, (FLAX SEED OIL) 1000 MG CAPS  . fluticasone (FLONASE) 50 MCG/ACT nasal spray  . hydrochlorothiazide (HYDRODIURIL) 25 MG tablet  . magnesium oxide (MAG-OX) 400 MG tablet  . montelukast (SINGULAIR) 10 MG tablet  . Probiotic Product (PROBIOTIC PO)  . SPIRIVA HANDIHALER 18 MCG inhalation capsule  . SYSTANE 0.4-0.3 % SOLN  . TURMERIC PO  . verapamil (VERELAN PM) 180 MG 24 hr capsule  . vitamin B-12 (CYANOCOBALAMIN) 1000 MCG tablet   . ipratropium-albuterol (DUONEB) 0.5-2.5 (3) MG/3ML nebulizer solution 3 mL    Myra Gianotti, PA-C Surgical Short Stay/Anesthesiology [facility] Phone [phone] [facility] Phone [phone] 05/23/2018 1:47 PM

## 2018-05-27 ENCOUNTER — Encounter (HOSPITAL_COMMUNITY)
Admission: RE | Admit: 2018-05-27 | Discharge: 2018-05-27 | Disposition: A | Discharge status: Home / Self Care | Payer: Medicare Other | Source: Ambulatory Visit | Attending: General Surgery | Admitting: General Surgery

## 2018-05-27 ENCOUNTER — Other Ambulatory Visit: Payer: Self-pay

## 2018-05-27 ENCOUNTER — Other Ambulatory Visit (HOSPITAL_COMMUNITY)
Admission: RE | Admit: 2018-05-27 | Discharge: 2018-05-27 | Disposition: A | Discharge status: Home / Self Care | Payer: Medicare Other | Source: Ambulatory Visit | Attending: General Surgery | Admitting: General Surgery

## 2018-05-27 DIAGNOSIS — Z79811 Long term (current) use of aromatase inhibitors: Secondary | ICD-10-CM | POA: Insufficient documentation

## 2018-05-27 DIAGNOSIS — Z01810 Encounter for preprocedural cardiovascular examination: Secondary | ICD-10-CM | POA: Insufficient documentation

## 2018-05-27 DIAGNOSIS — Z1159 Encounter for screening for other viral diseases: Secondary | ICD-10-CM | POA: Diagnosis not present

## 2018-05-27 DIAGNOSIS — R002 Palpitations: Secondary | ICD-10-CM | POA: Insufficient documentation

## 2018-05-27 DIAGNOSIS — Z01818 Encounter for other preprocedural examination: Secondary | ICD-10-CM | POA: Diagnosis present

## 2018-05-27 DIAGNOSIS — I517 Cardiomegaly: Secondary | ICD-10-CM | POA: Insufficient documentation

## 2018-05-27 DIAGNOSIS — Z79899 Other long term (current) drug therapy: Secondary | ICD-10-CM | POA: Insufficient documentation

## 2018-05-28 LAB — NOVEL CORONAVIRUS, NAA (HOSP ORDER, SEND-OUT TO REF LAB; TAT 18-24 HRS): SARS-CoV-2, NAA: NOT DETECTED

## 2018-05-29 ENCOUNTER — Other Ambulatory Visit: Payer: Self-pay

## 2018-05-29 ENCOUNTER — Ambulatory Visit
Admission: RE | Admit: 2018-05-29 | Discharge: 2018-05-29 | Disposition: A | Discharge status: Home / Self Care | Payer: Medicare Other | Source: Ambulatory Visit | Attending: General Surgery | Admitting: General Surgery

## 2018-05-29 DIAGNOSIS — N6082 Other benign mammary dysplasias of left breast: Secondary | ICD-10-CM

## 2018-05-29 IMAGING — MG NEEDLE LOCALIZATION OF THE LEFT BREAST WITH MAMMO GUIDANCE
7 series · 7 of 7 positions shown · non-contrast
Comparison: Previous exam(s).

CLINICAL DATA: Patient with a high risk lesion in the LEFT breast
scheduled for surgical excision requiring preoperative radioactive
seed localization

EXAM:
MAMMOGRAPHIC GUIDED RADIOACTIVE SEED LOCALIZATION OF THE LEFT BREAST

[L CC (1 of 4)]
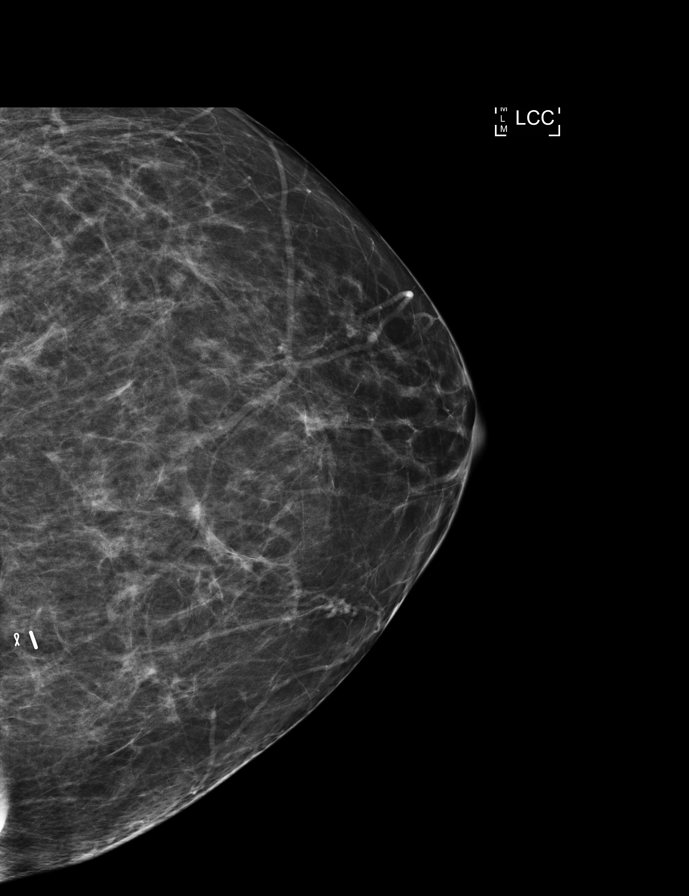

[L ML (1 of 3)]
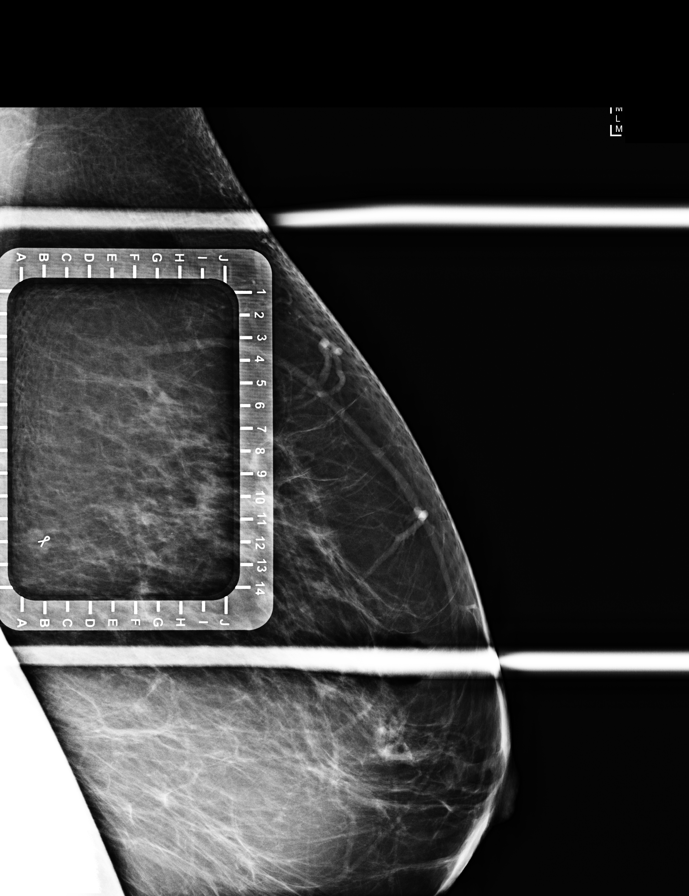

[L ML (2 of 3)]
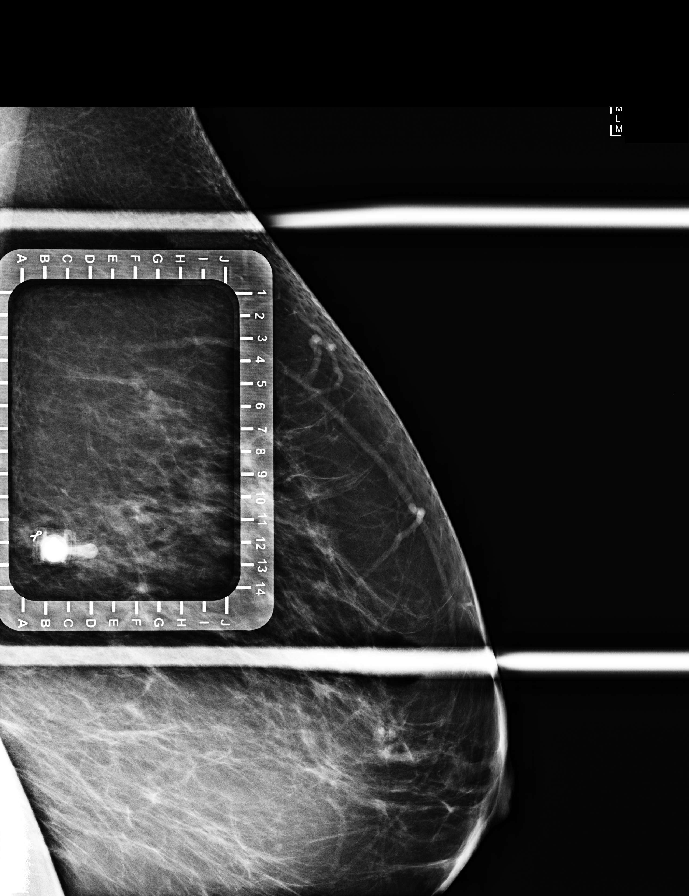

[L ML (3 of 3)]
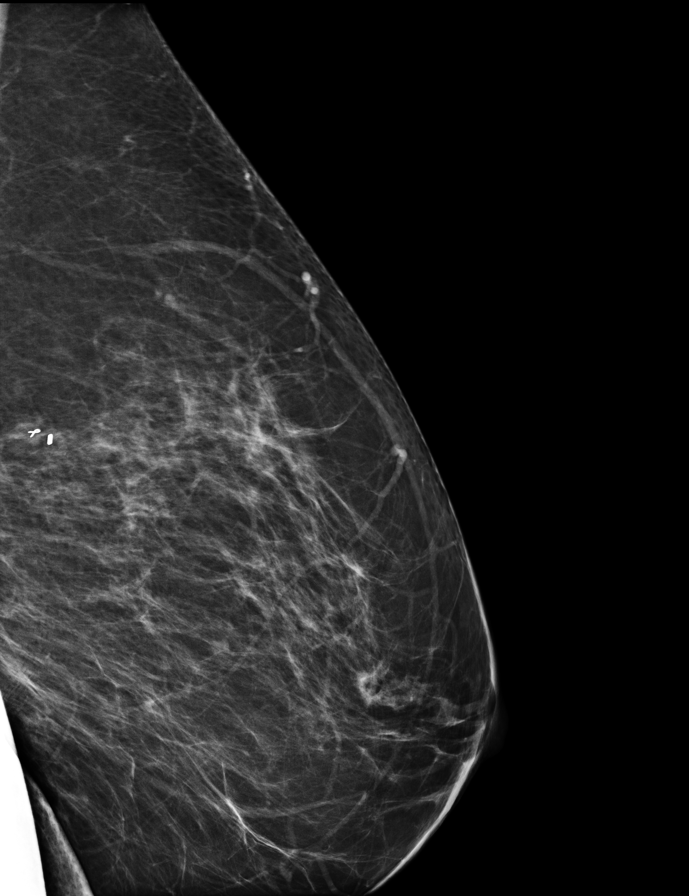

[L CC (2 of 4)]
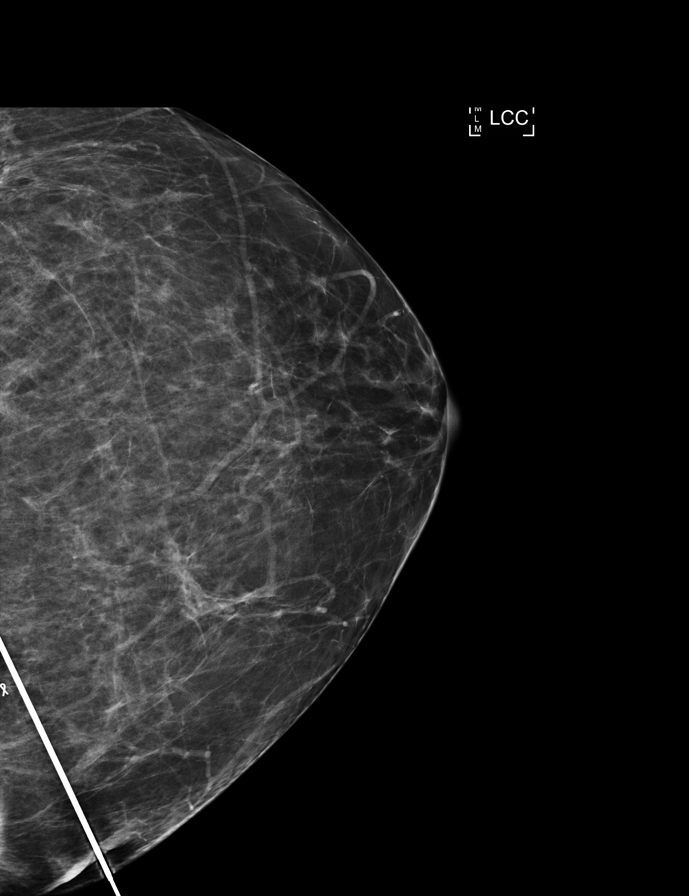

[L CC (3 of 4)]
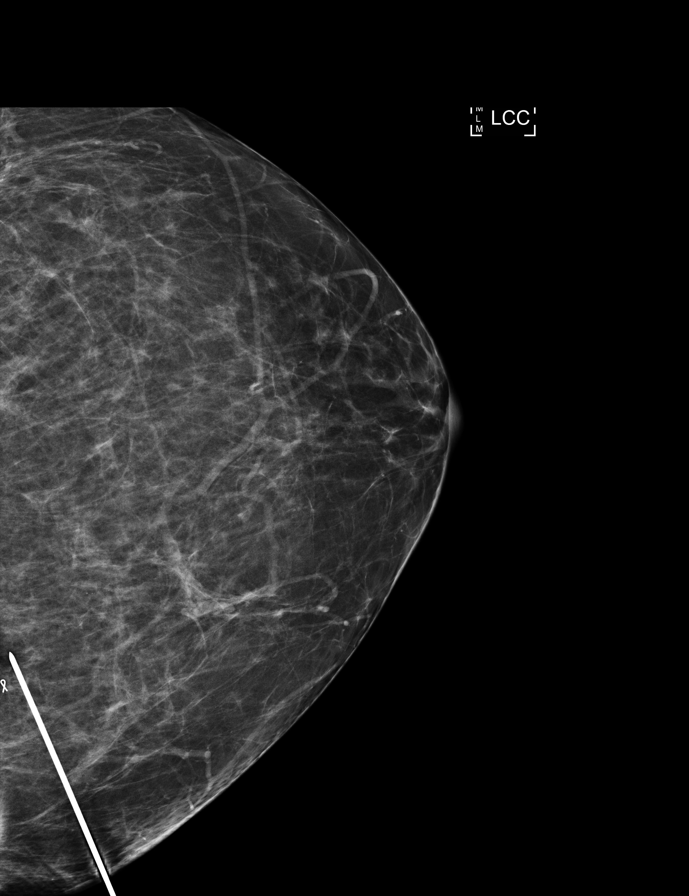

[L CC (4 of 4)]
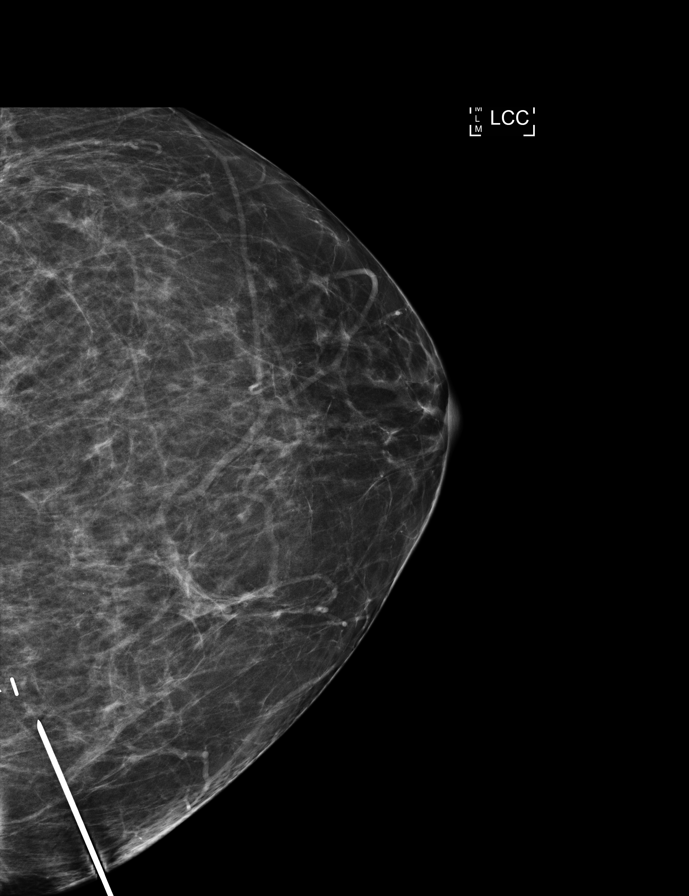

[7 of 7 positions shown; findings below may reference images not displayed]

FINDINGS: Patient presents for radioactive seed localization prior to surgical
excision. I met with the patient and we discussed the procedure of
seed localization including benefits and alternatives. We discussed
the high likelihood of a successful procedure. We discussed the
risks of the procedure including infection, bleeding, tissue injury
and further surgery. We discussed the low dose of radioactivity
involved in the procedure. Informed, written consent was given.

The usual time-out protocol was performed immediately prior to the
procedure.

Using mammographic guidance, sterile technique, 1% lidocaine and an
[9H] radioactive seed, was localized using a approach. The
follow-up mammogram images confirm the seed in the expected location
and were marked for Dr.

Follow-up survey of the patient confirms presence of the radioactive
seed.

Order number of [9H] seed:  [PHONE_NUMBER].

Total activity:  0.249 millicurie reference Date: [DATE]

The patient tolerated the procedure well and was released from the
[REDACTED]. She was given instructions regarding seed removal.
IMPRESSION: Radioactive seed localization left breast. No apparent
complications.

## 2018-05-30 ENCOUNTER — Ambulatory Visit
Admission: RE | Admit: 2018-05-30 | Discharge: 2018-05-30 | Disposition: A | Discharge status: Home / Self Care | Payer: Medicare Other | Source: Ambulatory Visit | Attending: General Surgery | Admitting: General Surgery

## 2018-05-30 ENCOUNTER — Ambulatory Visit (HOSPITAL_COMMUNITY): Payer: Medicare Other | Admitting: Vascular Surgery

## 2018-05-30 ENCOUNTER — Encounter (HOSPITAL_COMMUNITY): Payer: Self-pay

## 2018-05-30 ENCOUNTER — Other Ambulatory Visit: Payer: Self-pay

## 2018-05-30 ENCOUNTER — Ambulatory Visit (HOSPITAL_COMMUNITY)
Admission: RE | Admit: 2018-05-30 | Discharge: 2018-05-31 | Disposition: A | Discharge status: Home / Self Care | Payer: Medicare Other | Attending: General Surgery | Admitting: General Surgery

## 2018-05-30 ENCOUNTER — Encounter (HOSPITAL_COMMUNITY)
Admission: RE | Disposition: A | Discharge status: Home / Self Care | Payer: Self-pay | Source: Home / Self Care | Attending: General Surgery

## 2018-05-30 DIAGNOSIS — N6092 Unspecified benign mammary dysplasia of left breast: Secondary | ICD-10-CM | POA: Diagnosis not present

## 2018-05-30 DIAGNOSIS — J45909 Unspecified asthma, uncomplicated: Secondary | ICD-10-CM | POA: Insufficient documentation

## 2018-05-30 DIAGNOSIS — Z880 Allergy status to penicillin: Secondary | ICD-10-CM | POA: Diagnosis not present

## 2018-05-30 DIAGNOSIS — K219 Gastro-esophageal reflux disease without esophagitis: Secondary | ICD-10-CM | POA: Diagnosis not present

## 2018-05-30 DIAGNOSIS — K8010 Calculus of gallbladder with chronic cholecystitis without obstruction: Secondary | ICD-10-CM | POA: Insufficient documentation

## 2018-05-30 DIAGNOSIS — Z881 Allergy status to other antibiotic agents status: Secondary | ICD-10-CM | POA: Insufficient documentation

## 2018-05-30 DIAGNOSIS — Z882 Allergy status to sulfonamides status: Secondary | ICD-10-CM | POA: Diagnosis not present

## 2018-05-30 DIAGNOSIS — Z79899 Other long term (current) drug therapy: Secondary | ICD-10-CM | POA: Diagnosis not present

## 2018-05-30 DIAGNOSIS — Z888 Allergy status to other drugs, medicaments and biological substances status: Secondary | ICD-10-CM | POA: Diagnosis not present

## 2018-05-30 DIAGNOSIS — J209 Acute bronchitis, unspecified: Secondary | ICD-10-CM

## 2018-05-30 DIAGNOSIS — Z79890 Hormone replacement therapy (postmenopausal): Secondary | ICD-10-CM | POA: Diagnosis not present

## 2018-05-30 DIAGNOSIS — N6082 Other benign mammary dysplasias of left breast: Secondary | ICD-10-CM

## 2018-05-30 HISTORY — PX: CHOLECYSTECTOMY: SHX55

## 2018-05-30 HISTORY — PX: BREAST LUMPECTOMY WITH RADIOACTIVE SEED LOCALIZATION: SHX6424

## 2018-05-30 LAB — GLUCOSE, CAPILLARY
Glucose-Capillary: 129 mg/dL — ABNORMAL HIGH (ref 70–99)
Glucose-Capillary: 183 mg/dL — ABNORMAL HIGH (ref 70–99)

## 2018-05-30 IMAGING — MG BREAST SURGICAL SPECIMEN
1 series · 1 of 1 positions shown · non-contrast
Comparison: Previous exam(s).

CLINICAL DATA: Status post surgical excision of a left breast
lesion following radioactive seed localization.

EXAM:
SPECIMEN RADIOGRAPH OF THE LEFT BREAST

[L]
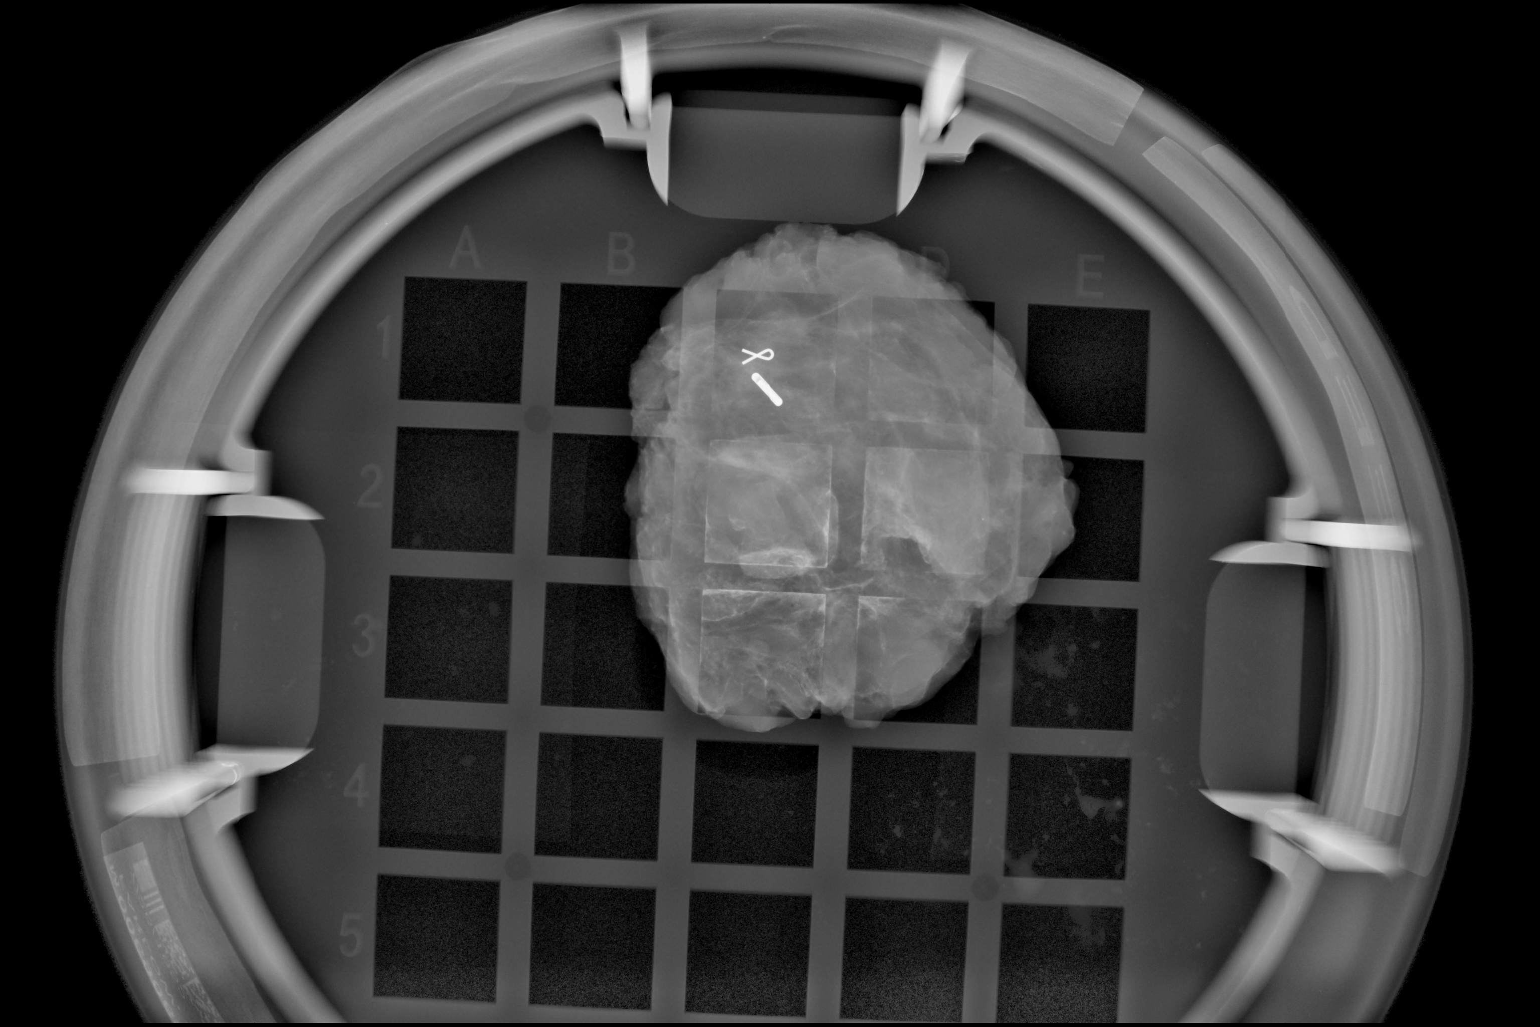

[1 of 1 positions shown; findings below may reference images not displayed]

FINDINGS: Status post excision of the left breast. The radioactive seed and
biopsy marker clip are present, completely intact, and were marked
for pathology.
IMPRESSION: Specimen radiograph of the left breast.

## 2018-05-30 SURGERY — BREAST LUMPECTOMY WITH RADIOACTIVE SEED LOCALIZATION
Anesthesia: General | Site: Breast

## 2018-05-30 MED ORDER — FENTANYL CITRATE (PF) 250 MCG/5ML IJ SOLN
INTRAMUSCULAR | Status: DC | PRN
  Administered 2018-05-30: 100 ug via INTRAVENOUS

## 2018-05-30 MED ORDER — LIDOCAINE 2% (20 MG/ML) 5 ML SYRINGE
INTRAMUSCULAR | Status: AC
  Filled 2018-05-30: qty 5

## 2018-05-30 MED ORDER — OXYCODONE HCL 5 MG PO TABS
5.0000 mg | ORAL_TABLET | Freq: Once | ORAL | Status: AC | PRN
  Administered 2018-05-30: 5 mg via ORAL

## 2018-05-30 MED ORDER — ONDANSETRON HCL 4 MG/2ML IJ SOLN
INTRAMUSCULAR | Status: DC | PRN
  Administered 2018-05-30: 4 mg via INTRAVENOUS

## 2018-05-30 MED ORDER — DEXAMETHASONE SODIUM PHOSPHATE 10 MG/ML IJ SOLN
INTRAMUSCULAR | Status: AC
  Filled 2018-05-30: qty 1

## 2018-05-30 MED ORDER — ONDANSETRON HCL 4 MG/2ML IJ SOLN
INTRAMUSCULAR | Status: AC
  Filled 2018-05-30: qty 2

## 2018-05-30 MED ORDER — MORPHINE SULFATE (PF) 2 MG/ML IV SOLN: 1.0000 mg | INTRAVENOUS | Status: DC | PRN

## 2018-05-30 MED ORDER — HYDROCHLOROTHIAZIDE 25 MG PO TABS: 25.0000 mg | ORAL_TABLET | Freq: Every day | ORAL | Status: DC | PRN

## 2018-05-30 MED ORDER — PROPOFOL 10 MG/ML IV BOLUS
INTRAVENOUS | Status: AC
  Filled 2018-05-30: qty 20

## 2018-05-30 MED ORDER — 0.9 % SODIUM CHLORIDE (POUR BTL) OPTIME
TOPICAL | Status: DC | PRN
  Administered 2018-05-30: 13:00:00 1000 mL

## 2018-05-30 MED ORDER — FENTANYL CITRATE (PF) 250 MCG/5ML IJ SOLN
INTRAMUSCULAR | Status: AC
  Filled 2018-05-30: qty 5

## 2018-05-30 MED ORDER — GABAPENTIN 300 MG PO CAPS
ORAL_CAPSULE | ORAL | Status: AC
  Administered 2018-05-30: 12:00:00 300 mg via ORAL
  Filled 2018-05-30: qty 1

## 2018-05-30 MED ORDER — VERAPAMIL HCL ER 180 MG PO TBCR
180.0000 mg | EXTENDED_RELEASE_TABLET | Freq: Every day | ORAL | Status: DC
  Administered 2018-05-30: 180 mg via ORAL
  Filled 2018-05-30: qty 1

## 2018-05-30 MED ORDER — LIDOCAINE 2% (20 MG/ML) 5 ML SYRINGE
INTRAMUSCULAR | Status: DC | PRN
  Administered 2018-05-30: 60 mg via INTRAVENOUS

## 2018-05-30 MED ORDER — CEFAZOLIN SODIUM-DEXTROSE 2-3 GM-%(50ML) IV SOLR
INTRAVENOUS | Status: DC | PRN
  Administered 2018-05-30: 2 g via INTRAVENOUS

## 2018-05-30 MED ORDER — ACETAMINOPHEN 500 MG PO TABS
1000.0000 mg | ORAL_TABLET | ORAL | Status: AC
  Administered 2018-05-30: 12:00:00 1000 mg via ORAL

## 2018-05-30 MED ORDER — ROCURONIUM BROMIDE 10 MG/ML (PF) SYRINGE
PREFILLED_SYRINGE | INTRAVENOUS | Status: DC | PRN
  Administered 2018-05-30: 50 mg via INTRAVENOUS

## 2018-05-30 MED ORDER — PROPOFOL 10 MG/ML IV BOLUS
INTRAVENOUS | Status: DC | PRN
  Administered 2018-05-30: 150 mg via INTRAVENOUS
  Administered 2018-05-30: 20 mg via INTRAVENOUS

## 2018-05-30 MED ORDER — TIOTROPIUM BROMIDE MONOHYDRATE 18 MCG IN CAPS: 18.0000 ug | ORAL_CAPSULE | Freq: Every day | RESPIRATORY_TRACT | Status: DC

## 2018-05-30 MED ORDER — SODIUM CHLORIDE 0.9 % IV SOLN: INTRAVENOUS | Status: DC | PRN

## 2018-05-30 MED ORDER — HEPARIN SODIUM (PORCINE) 5000 UNIT/ML IJ SOLN
5000.0000 [IU] | Freq: Three times a day (TID) | INTRAMUSCULAR | Status: DC
  Administered 2018-05-31: 5000 [IU] via SUBCUTANEOUS
  Filled 2018-05-30 (×2): qty 1

## 2018-05-30 MED ORDER — BUPIVACAINE-EPINEPHRINE 0.25% -1:200000 IJ SOLN
INTRAMUSCULAR | Status: AC
  Filled 2018-05-30: qty 1

## 2018-05-30 MED ORDER — SCOPOLAMINE 1 MG/3DAYS TD PT72
MEDICATED_PATCH | TRANSDERMAL | Status: DC | PRN
  Administered 2018-05-30: 1 via TRANSDERMAL

## 2018-05-30 MED ORDER — SUCCINYLCHOLINE CHLORIDE 200 MG/10ML IV SOSY
PREFILLED_SYRINGE | INTRAVENOUS | Status: DC | PRN
  Administered 2018-05-30: 120 mg via INTRAVENOUS

## 2018-05-30 MED ORDER — FENTANYL CITRATE (PF) 100 MCG/2ML IJ SOLN
INTRAMUSCULAR | Status: AC
  Filled 2018-05-30: qty 2

## 2018-05-30 MED ORDER — SUGAMMADEX SODIUM 500 MG/5ML IV SOLN
INTRAVENOUS | Status: AC
  Filled 2018-05-30: qty 5

## 2018-05-30 MED ORDER — SODIUM CHLORIDE 0.9 % IR SOLN
Status: DC | PRN
  Administered 2018-05-30: 1000 mL

## 2018-05-30 MED ORDER — HYDROCODONE-ACETAMINOPHEN 5-325 MG PO TABS: 1.0000 | ORAL_TABLET | ORAL | Status: DC | PRN

## 2018-05-30 MED ORDER — MIDAZOLAM HCL 2 MG/2ML IJ SOLN
INTRAMUSCULAR | Status: DC | PRN
  Administered 2018-05-30: 2 mg via INTRAVENOUS

## 2018-05-30 MED ORDER — HYDROCODONE-ACETAMINOPHEN 5-325 MG PO TABS: 1.0000 | ORAL_TABLET | Freq: Four times a day (QID) | ORAL | 0 refills | Status: DC | PRN

## 2018-05-30 MED ORDER — ONDANSETRON 4 MG PO TBDP: 4.0000 mg | ORAL_TABLET | Freq: Four times a day (QID) | ORAL | Status: DC | PRN

## 2018-05-30 MED ORDER — MIDAZOLAM HCL 2 MG/2ML IJ SOLN
INTRAMUSCULAR | Status: AC
  Filled 2018-05-30: qty 2

## 2018-05-30 MED ORDER — PANTOPRAZOLE SODIUM 40 MG IV SOLR
40.0000 mg | Freq: Every day | INTRAVENOUS | Status: DC
  Administered 2018-05-30: 40 mg via INTRAVENOUS
  Filled 2018-05-30: qty 40

## 2018-05-30 MED ORDER — BUPIVACAINE-EPINEPHRINE 0.25% -1:200000 IJ SOLN
INTRAMUSCULAR | Status: DC | PRN
  Administered 2018-05-30: 22 mL
  Administered 2018-05-30: 10 mL

## 2018-05-30 MED ORDER — UMECLIDINIUM BROMIDE 62.5 MCG/INH IN AEPB
1.0000 | INHALATION_SPRAY | Freq: Every day | RESPIRATORY_TRACT | Status: DC
  Filled 2018-05-30: qty 7

## 2018-05-30 MED ORDER — LACTATED RINGERS IV SOLN
INTRAVENOUS | Status: DC
  Administered 2018-05-30 (×2): via INTRAVENOUS

## 2018-05-30 MED ORDER — ONDANSETRON HCL 4 MG/2ML IJ SOLN: 4.0000 mg | Freq: Four times a day (QID) | INTRAMUSCULAR | Status: DC | PRN

## 2018-05-30 MED ORDER — IPRATROPIUM-ALBUTEROL 0.5-2.5 (3) MG/3ML IN SOLN
3.0000 mL | Freq: Once | RESPIRATORY_TRACT | Status: AC
  Administered 2018-05-30: 3 mL via RESPIRATORY_TRACT
  Filled 2018-05-30: qty 3

## 2018-05-30 MED ORDER — OXYCODONE HCL 5 MG PO TABS
ORAL_TABLET | ORAL | Status: AC
  Filled 2018-05-30: qty 1

## 2018-05-30 MED ORDER — FENTANYL CITRATE (PF) 100 MCG/2ML IJ SOLN: 25.0000 ug | INTRAMUSCULAR | Status: DC | PRN

## 2018-05-30 MED ORDER — OXYCODONE HCL 5 MG/5ML PO SOLN: 5.0000 mg | Freq: Once | ORAL | Status: AC | PRN

## 2018-05-30 MED ORDER — CHLORHEXIDINE GLUCONATE CLOTH 2 % EX PADS: 6.0000 | MEDICATED_PAD | Freq: Once | CUTANEOUS | Status: DC

## 2018-05-30 MED ORDER — DEXAMETHASONE SODIUM PHOSPHATE 10 MG/ML IJ SOLN
INTRAMUSCULAR | Status: DC | PRN
  Administered 2018-05-30: 10 mg via INTRAVENOUS

## 2018-05-30 MED ORDER — MONTELUKAST SODIUM 10 MG PO TABS
10.0000 mg | ORAL_TABLET | Freq: Every day | ORAL | Status: DC
  Administered 2018-05-30 – 2018-05-31 (×2): 10 mg via ORAL
  Filled 2018-05-30 (×2): qty 1

## 2018-05-30 MED ORDER — ONDANSETRON HCL 4 MG/2ML IJ SOLN: 4.0000 mg | Freq: Once | INTRAMUSCULAR | Status: DC | PRN

## 2018-05-30 MED ORDER — GABAPENTIN 300 MG PO CAPS
300.0000 mg | ORAL_CAPSULE | ORAL | Status: AC
  Administered 2018-05-30: 300 mg via ORAL

## 2018-05-30 MED ORDER — PHENYLEPHRINE 40 MCG/ML (10ML) SYRINGE FOR IV PUSH (FOR BLOOD PRESSURE SUPPORT)
PREFILLED_SYRINGE | INTRAVENOUS | Status: DC | PRN
  Administered 2018-05-30: 80 ug via INTRAVENOUS

## 2018-05-30 MED ORDER — ALBUTEROL SULFATE (2.5 MG/3ML) 0.083% IN NEBU: 3.0000 mL | INHALATION_SOLUTION | RESPIRATORY_TRACT | Status: DC | PRN

## 2018-05-30 MED ORDER — ACETAMINOPHEN 500 MG PO TABS
ORAL_TABLET | ORAL | Status: AC
  Administered 2018-05-30: 1000 mg via ORAL
  Filled 2018-05-30: qty 2

## 2018-05-30 MED ORDER — SUGAMMADEX SODIUM 200 MG/2ML IV SOLN
INTRAVENOUS | Status: DC | PRN
  Administered 2018-05-30: 180 mg via INTRAVENOUS

## 2018-05-30 MED ORDER — KCL IN DEXTROSE-NACL 20-5-0.9 MEQ/L-%-% IV SOLN
INTRAVENOUS | Status: DC
  Administered 2018-05-30: 19:00:00 via INTRAVENOUS
  Filled 2018-05-30: qty 1000

## 2018-05-30 MED ORDER — CEFAZOLIN SODIUM-DEXTROSE 2-4 GM/100ML-% IV SOLN
INTRAVENOUS | Status: AC
  Filled 2018-05-30: qty 100

## 2018-05-30 SURGICAL SUPPLY — 55 items
ADH SKN CLS APL DERMABOND .7 (GAUZE/BANDAGES/DRESSINGS) ×4
APPLIER CLIP 5 13 M/L LIGAMAX5 (MISCELLANEOUS) ×4
APPLIER CLIP 9.375 MED OPEN (MISCELLANEOUS)
APR CLP MED 9.3 20 MLT OPN (MISCELLANEOUS)
APR CLP MED LRG 5 ANG JAW (MISCELLANEOUS) ×2
BAG SPEC RTRVL LRG 6X4 10 (ENDOMECHANICALS) ×2
BINDER BREAST LRG (GAUZE/BANDAGES/DRESSINGS) IMPLANT
BINDER BREAST XLRG (GAUZE/BANDAGES/DRESSINGS) ×2 IMPLANT
BLADE CLIPPER SURG (BLADE) IMPLANT
CANISTER SUCT 3000ML PPV (MISCELLANEOUS) ×4 IMPLANT
CATH REDDICK CHOLANGI 4FR 50CM (CATHETERS) ×4 IMPLANT
CHLORAPREP W/TINT 26ML (MISCELLANEOUS) ×4 IMPLANT
CLIP APPLIE 5 13 M/L LIGAMAX5 (MISCELLANEOUS) ×2 IMPLANT
CLIP APPLIE 9.375 MED OPEN (MISCELLANEOUS) IMPLANT
COVER MAYO STAND STRL (DRAPES) ×4 IMPLANT
COVER PROBE W GEL 5X96 (DRAPES) ×4 IMPLANT
COVER SURGICAL LIGHT HANDLE (MISCELLANEOUS) ×4 IMPLANT
COVER WAND RF STERILE (DRAPES) ×4 IMPLANT
DERMABOND ADVANCED (GAUZE/BANDAGES/DRESSINGS) ×4
DERMABOND ADVANCED .7 DNX12 (GAUZE/BANDAGES/DRESSINGS) ×2 IMPLANT
DEVICE DUBIN SPECIMEN MAMMOGRA (MISCELLANEOUS) ×4 IMPLANT
DRAPE C-ARM 42X72 X-RAY (DRAPES) ×4 IMPLANT
DRAPE CHEST BREAST 15X10 FENES (DRAPES) ×4 IMPLANT
ELECT COATED BLADE 2.86 ST (ELECTRODE) ×4 IMPLANT
ELECT REM PT RETURN 9FT ADLT (ELECTROSURGICAL) ×4
ELECTRODE REM PT RTRN 9FT ADLT (ELECTROSURGICAL) ×2 IMPLANT
GLOVE BIO SURGEON STRL SZ7.5 (GLOVE) ×8 IMPLANT
GOWN STRL REUS W/ TWL LRG LVL3 (GOWN DISPOSABLE) ×6 IMPLANT
GOWN STRL REUS W/TWL LRG LVL3 (GOWN DISPOSABLE) ×12
IV CATH 14GX2 1/4 (CATHETERS) ×4 IMPLANT
KIT BASIN OR (CUSTOM PROCEDURE TRAY) ×4 IMPLANT
KIT MARKER MARGIN INK (KITS) ×4 IMPLANT
KIT TURNOVER KIT B (KITS) ×4 IMPLANT
LIGHT WAVEGUIDE WIDE FLAT (MISCELLANEOUS) IMPLANT
NDL HYPO 25GX1X1/2 BEV (NEEDLE) ×2 IMPLANT
NEEDLE HYPO 25GX1X1/2 BEV (NEEDLE) ×4 IMPLANT
NS IRRIG 1000ML POUR BTL (IV SOLUTION) ×4 IMPLANT
PACK GENERAL/GYN (CUSTOM PROCEDURE TRAY) ×4 IMPLANT
PAD ARMBOARD 7.5X6 YLW CONV (MISCELLANEOUS) ×4 IMPLANT
POUCH SPECIMEN RETRIEVAL 10MM (ENDOMECHANICALS) ×4 IMPLANT
SCISSORS LAP 5X35 DISP (ENDOMECHANICALS) ×4 IMPLANT
SET IRRIG TUBING LAPAROSCOPIC (IRRIGATION / IRRIGATOR) ×4 IMPLANT
SET TUBE SMOKE EVAC HIGH FLOW (TUBING) ×4 IMPLANT
SLEEVE ENDOPATH XCEL 5M (ENDOMECHANICALS) ×8 IMPLANT
SPECIMEN JAR SMALL (MISCELLANEOUS) ×4 IMPLANT
SUT MNCRL AB 4-0 PS2 18 (SUTURE) ×4 IMPLANT
SUT SILK 2 0 SH (SUTURE) IMPLANT
SUT VIC AB 3-0 SH 18 (SUTURE) ×4 IMPLANT
SYR CONTROL 10ML LL (SYRINGE) ×4 IMPLANT
TOWEL OR 17X24 6PK STRL BLUE (TOWEL DISPOSABLE) ×4 IMPLANT
TOWEL OR 17X26 10 PK STRL BLUE (TOWEL DISPOSABLE) ×4 IMPLANT
TRAY LAPAROSCOPIC MC (CUSTOM PROCEDURE TRAY) ×4 IMPLANT
TROCAR XCEL BLUNT TIP 100MML (ENDOMECHANICALS) ×4 IMPLANT
TROCAR XCEL NON-BLD 5MMX100MML (ENDOMECHANICALS) ×4 IMPLANT
WATER STERILE IRR 1000ML POUR (IV SOLUTION) ×4 IMPLANT

## 2018-05-30 NOTE — Progress Notes (Signed)
Patient admitted to [facility]. Alert and oriented.  Incisions unremarkable. Oriented to room. Call bell in reach.  Assisted to BR upon arrival. Denies pain. Reports "a little swimmy headed". Will continue to monitor.

## 2018-05-30 NOTE — Op Note (Addendum)
05/30/2018  2:22 PM  PATIENT:  [name]  68 y.o. female  PRE-OPERATIVE DIAGNOSIS:  CHOLECYSTITIS W CHOLELITHIASIS, LEFT BREAST FLAT EPITHELIAL ATYPIA  POST-OPERATIVE DIAGNOSIS:  CHOLECYSTITIS W CHOLELITHIASIS, LEFT BREAST FLAT EPITHELIAL ATYPIA  PROCEDURE:  Procedure(s): LEFT BREAST LUMPECTOMY WITH RADIOACTIVE SEED LOCALIZATION (Left) LAPAROSCOPIC CHOLECYSTECTOMY   SURGEON:  Surgeon(s) and Role:    * Jovita Kussmaul, MD - Primary    * Fanny Skates, MD - Assisting  PHYSICIAN ASSISTANT:   ASSISTANTS: Dr. Dalbert Batman   ANESTHESIA:   local and general  EBL:  minimal   BLOOD ADMINISTERED:none  DRAINS: none   LOCAL MEDICATIONS USED:  MARCAINE     SPECIMEN:  Source of Specimen:  gallbladder, left breast tissue  DISPOSITION OF SPECIMEN:  PATHOLOGY  COUNTS:  YES  TOURNIQUET:  * No tourniquets in log *  DICTATION: .Dragon Dictation     Procedure: After informed consent was obtained the patient was brought to the operating room and placed in the supine position on the operating room table. After adequate induction of general anesthesia the patient's abdomen was prepped with ChloraPrep allowed to dry and draped in usual sterile manner. An appropriate timeout was performed. The area below the umbilicus was infiltrated with quarter percent  Marcaine. A small incision was made with a 15 blade knife. The incision was carried down through the subcutaneous tissue bluntly with a hemostat and [employer] retractors. The linea alba was identified. The linea alba was incised with a 15 blade knife and each side was grasped with Coker clamps. The preperitoneal space was then probed with a hemostat until the peritoneum was opened and access was gained to the abdominal cavity. A 0 Vicryl pursestring stitch was placed in the fascia surrounding the opening. A Hassan cannula was then placed through the opening and anchored in place with the previously placed Vicryl purse string stitch. The abdomen  was insufflated with carbon dioxide without difficulty. A laparoscope was inserted through the [facility] cannula in the right upper quadrant was inspected.  There were adhesions from the previous surgery in the right upper quadrant.  next 2 sites were chosen laterally on the right side of the abdomen for placement of 5 mm ports. Each of these areas was infiltrated with quarter percent Marcaine. Small stab incisions were made with a 15 blade knife. 5 mm ports were then placed bluntly through these incisions into the abdominal cavity under direct vision without difficulty.  After these ports were placed then the adhesions were taken down sharply with the laparoscopic scissors.  Next the epigastric port was then able to be placed under direct vision as well.  A blunt grasper was placed through the lateralmost 5 mm port and used to grasp the dome of the gallbladder and elevated anteriorly and superiorly. Another blunt grasper was placed through the other 5 mm port and used to retract the body and neck of the gallbladder. A dissector was placed through the epigastric port and using the electrocautery the peritoneal reflection at the gallbladder neck was opened. Blunt dissection was then carried out in this area until the gallbladder neck-cystic duct junction was readily identified and a good window was created. A single clip was placed on the gallbladder neck. A small  ductotomy was made just below the clip with laparoscopic scissors. A 14-gauge Angiocath was then placed through the anterior abdominal wall under direct vision. A Reddick cholangiogram catheter was then placed through the Angiocath and flushed.  The catheter could not be placed  in the cystic duct because likely of a valve.  We could see a good critical window and the anatomy seem very clear.  The cystic duct appeared short.  We did not want to injure the common duct so at this point the cholangiogram was aborted.  2 clips were placed proximally on the cystic  duct and the duct was divided between the 2 sets of clips. Posterior to this the cystic artery was identified and again dissected bluntly in a circumferential manner until a good window  was created. 2 clips were placed proximally and one distally on the artery and the artery was divided between the 2 sets of clips. Next a laparoscopic hook cautery device was used to separate the gallbladder from the liver bed. Prior to completely detaching the gallbladder from the liver bed the liver bed was inspected and several small bleeding points were coagulated with the electrocautery until the area was completely hemostatic. The gallbladder was then detached the rest of it from the liver bed without difficulty. A laparoscopic bag was inserted through the hassan port. The laparoscope was moved to the epigastric port. The gallbladder was placed within the bag and the bag was sealed.  The bag with the gallbladder was then removed with the [facility] cannula through the infraumbilical port without difficulty. The fascial defect was then closed with the previously placed Vicryl pursestring stitch as well as with another figure-of-eight 0 Vicryl stitch. The liver bed was inspected again and found to be hemostatic. The abdomen was irrigated with copious amounts of saline until the effluent was clear. The ports were then removed under direct vision without difficulty and were found to be hemostatic. The gas was allowed to escape. The skin incisions were all closed with interrupted 4-0 Monocryl subcuticular stitches.  The assistant was essential for visualization and retraction during the case. Attention was then turned to the left breast.  Previously an I-125 seed was placed in the upper inner quadrant of the left breast to mark an area of atypia.  The neoprobe was set to I-125 in the area of radioactivity was readily identified far in the upper inner quadrant of the left breast.  The area around this was infiltrated with quarter  percent Marcaine.  A small transversely oriented incision was made with a 15 blade knife overlying the area of radioactivity.  The incision was carried through the skin and subcutaneous tissue sharply with the electrocautery.  Dissection was carried out sharply with electrocautery towards the radioactive seed under the direction of the neoprobe.  Once I more closely approached the radioactive seed I then removed a circular portion of breast tissue sharply with the electrocautery around the radioactive seed while checking the area of radioactivity frequently.  Once the specimen was removed it was oriented with the appropriate paint colors.  A specimen radiograph was obtained that showed the clip and seed to be near the center of the specimen.  The specimen was then sent to pathology for further evaluation.  Hemostasis was achieved using the Bovie electrocautery.  The wound was irrigated with saline and infiltrated with quarter percent Marcaine.  The deep layer of the wound was then closed with interrupted 3-0 Vicryl stitches.  The skin was closed with a running 4-0 Monocryl subcuticular stitch.  Dermabond dressings were applied. The patient tolerated the procedure well. At the end of the case all needle sponge and instrument counts were correct. The patient was then awakened and taken to recovery in stable condition  PLAN  OF CARE: Admit for overnight observation  PATIENT DISPOSITION:  PACU - hemodynamically stable.   Delay start of Pharmacological VTE agent (>24hrs) due to surgical blood loss or risk of bleeding: not applicable

## 2018-05-30 NOTE — H&P (Signed)
[name]  Location: [facility] Patient #: [id number] DOB: 06-Apr-1950 Married / Language: English / Race: White Female   History of Present Illness  The patient is a 68 year old female who presents with abdominal pain. We are asked to see the patient in consultation by Dr. Dory Horn to evaluate her for an abnormality in the left breast as well as gallstones. The patient is a 68 year old white female who initially went for a routine screening mammogram. At that time she was found to have a 6 mm mass in the upper inner quadrant of the left breast. This was biopsied and came back as flat epithelial atypia. She denies any breast pain or discharge from the nipple. Shortly thereafter she also developed epigastric and right upper quadrant pain that radiated into the back. The pain was associated with some nausea but no vomiting. She underwent an ultrasound which did show stones in the gallbladder as well as some significant gallbladder wall thickening. Liver functions were not checked.   Past Surgical History  Breast Biopsy  Left. Cesarean Section - Multiple  Colon Polyp Removal - Colonoscopy  Foot Surgery  Bilateral. Hysterectomy (not due to cancer) - Complete   Diagnostic Studies History  Colonoscopy  1-5 years ago Mammogram  within last year Pap Smear  1-5 years ago  Allergies  Penicillins  Sulfa Drugs  Cephalosporins  Reglan *GASTROINTESTINAL AGENTS - MISC.*  Fish   Medication History Verapamil HCl ER (180MG  Capsule ER 24HR, Oral) Active. Spiriva HandiHaler (18MCG Capsule, Inhalation) Active. Estradiol (1MG  Tablet, Oral) Active. hydroCHLOROthiazide (25MG  Tablet, Oral) Active. Singulair (10MG  Tablet, Oral) Active. Pepcid (20MG  Tablet, Oral) Active. Allegra Allergy (180MG  Tablet, Oral) Active. ProAir HFA (108 (90 Base)MCG/ACT Aerosol Soln, Inhalation) Active. Medications Reconciled  Social History Alcohol use  Occasional alcohol  use. Caffeine use  Carbonated beverages, Tea. No drug use  Tobacco use  Never smoker.  Family History  Heart disease in female family member before age 11  Hypertension  Father.  Pregnancy / Birth History  Age at menarche  18 years. Age of menopause  69-60 Contraceptive History  Oral contraceptives. Gravida  2 Maternal age  54-30 Para  2  Other Problems Asthma  Gastroesophageal Reflux Disease  Oophorectomy  Bilateral. Other disease, cancer, significant illness     Review of Systems  General Present- Fatigue and Fever. Not Present- Appetite Loss, Chills, Night Sweats, Weight Gain and Weight Loss. Skin Present- Dryness. Not Present- Change in Wart/Mole, Hives, Jaundice, New Lesions, Non-Healing Wounds, Rash and Ulcer. HEENT Present- Seasonal Allergies and Wears glasses/contact lenses. Not Present- Earache, Hearing Loss, Hoarseness, Nose Bleed, Oral Ulcers, Ringing in the Ears, Sinus Pain, Sore Throat, Visual Disturbances and Yellow Eyes. Respiratory Not Present- Bloody sputum, Chronic Cough, Difficulty Breathing, Snoring and Wheezing. Breast Present- Breast Mass. Not Present- Breast Pain, Nipple Discharge and Skin Changes. Cardiovascular Not Present- Chest Pain, Difficulty Breathing Lying Down, Leg Cramps, Palpitations, Rapid Heart Rate, Shortness of Breath and Swelling of Extremities. Gastrointestinal Present- Abdominal Pain, Bloating, Gets full quickly at meals and Indigestion. Not Present- Bloody Stool, Change in Bowel Habits, Chronic diarrhea, Constipation, Difficulty Swallowing, Excessive gas, Hemorrhoids, Nausea, Rectal Pain and Vomiting. Musculoskeletal Present- Back Pain. Not Present- Joint Pain, Joint Stiffness, Muscle Pain, Muscle Weakness and Swelling of Extremities. Neurological Present- Headaches. Not Present- Decreased Memory, Fainting, Numbness, Seizures, Tingling, Tremor, Trouble walking and Weakness. Psychiatric Not Present- Anxiety, Bipolar, Change in  Sleep Pattern, Depression, Fearful and Frequent crying. Endocrine Not Present- Cold Intolerance, Excessive  Hunger, Hair Changes, Heat Intolerance, Hot flashes and New Diabetes.  Vitals Weight: 156 lb Height: 64.5in Body Surface Area: 1.77 m Body Mass Index: 26.36 kg/m  Temp.: 98.11F(Oral)  Pulse: 80 (Regular)  BP: 144/78 (Sitting, Left Arm, Standard)       Physical Exam  General Mental Status-Alert. General Appearance-Consistent with stated age. Hydration-Well hydrated. Voice-Normal.  Head and Neck Head-normocephalic, atraumatic with no lesions or palpable masses. Trachea-midline. Thyroid Gland Characteristics - normal size and consistency.  Eye Eyeball - Bilateral-Extraocular movements intact. Sclera/Conjunctiva - Bilateral-No scleral icterus.  Chest and Lung Exam Chest and lung exam reveals -quiet, even and easy respiratory effort with no use of accessory muscles and on auscultation, normal breath sounds, no adventitious sounds and normal vocal resonance. Inspection Chest Wall - Normal. Back - normal.  Breast Note: There is no palpable mass in either breast. There is no palpable axillary, supraclavicular, or cervical lymphadenopathy.   Cardiovascular Cardiovascular examination reveals -normal heart sounds, regular rate and rhythm with no murmurs and normal pedal pulses bilaterally.  Abdomen Note: The abdomen is soft. There is mild to moderate right upper quadrant tenderness. There is no palpable mass. There is a well-healed paramedian scar in the right upper quadrant.   Neurologic Neurologic evaluation reveals -alert and oriented x 3 with no impairment of recent or remote memory. Mental Status-Normal.  Musculoskeletal Normal Exam - Left-Upper Extremity Strength Normal and Lower Extremity Strength Normal. Normal Exam - Right-Upper Extremity Strength Normal and Lower Extremity Strength Normal.  Lymphatic Head &  Neck  General Head & Neck Lymphatics: Bilateral - Description - Normal. Axillary  General Axillary Region: Bilateral - Description - Normal. Tenderness - Non Tender. Femoral & Inguinal  Generalized Femoral & Inguinal Lymphatics: Bilateral - Description - Normal. Tenderness - Non Tender.    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 Although this does not have the significant elevation in breast cancer risk that atypical duct hyperplasia has it is frequently associated with atypical duct hyperplasia and for this reason I think it would be reasonable to remove this area to make sure that we are not missing a more significant diagnosis. This would require a left breast radioactive seed localized lumpectomy. I have discussed with her in detail the risks and benefits of the operation as well as some of the technical aspects and she understands and wishes to proceed. We will plan for this when our surgery restrictions are lifted. 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 beCause of the risk of further painful episodes and possible pancreatitis and think she would benefit from having her gallbladder removed. She would also like to have this done. I have discussed with her in detail the risks and benefits of the operation as well as some of the technical aspects and she understands and wishes to proceed. There is also a risk of having to do this open given her history of pyloric stenosis surgery. Because of the apparent inflammation of the gallbladder I will start her on antibiotics and see if this helps her pain. If it does  not then she may have to have her surgery to remove the gallbladder sooner rather than later Current Plans  Started Augmentin 500-125 MG Oral Tablet.

## 2018-05-30 NOTE — Anesthesia Preprocedure Evaluation (Addendum)
Anesthesia Evaluation  Patient identified by MRN, date of birth, ID band Patient awake    Reviewed: Allergy & Precautions, NPO status , Patient's Chart, lab work & pertinent test results  History of Anesthesia Complications Negative for: history of anesthetic complications  Airway Mallampati: II  TM Distance: >3 FB Neck ROM: Full    Dental  (+) Dental Advisory Given, Caps   Pulmonary asthma ,    breath sounds clear to auscultation       Cardiovascular negative cardio ROS   Rhythm:Regular Rate:Normal     Neuro/Psych  Headaches, PSYCHIATRIC DISORDERS Depression    GI/Hepatic Neg liver ROS, GERD  Medicated and Controlled, Gastroparesis    Endo/Other   Pre-DM   Renal/GU negative Renal ROS     Musculoskeletal negative musculoskeletal ROS (+)   Abdominal   Peds  Hematology negative hematology ROS (+)   Anesthesia Other Findings   Reproductive/Obstetrics                            Anesthesia Physical Anesthesia Plan  ASA: II  Anesthesia Plan: General   Post-op Pain Management:    Induction: Intravenous  PONV Risk Score and Plan: 3 and Treatment may vary due to age or medical condition, Ondansetron, Dexamethasone and Scopolamine patch - Pre-op  Airway Management Planned: Oral ETT  Additional Equipment: None  Intra-op Plan:   Post-operative Plan: Extubation in OR  Informed Consent: I have reviewed the patients History and Physical, chart, labs and discussed the procedure including the risks, benefits and alternatives for the proposed anesthesia with the patient or authorized representative who has indicated his/her understanding and acceptance.     Dental advisory given  Plan Discussed with: CRNA and Anesthesiologist  Anesthesia Plan Comments:        Anesthesia Quick Evaluation

## 2018-05-30 NOTE — Anesthesia Procedure Notes (Addendum)
Procedure Name: Intubation Date/Time: 05/30/2018 12:56 PM Performed by: Bryson Corona, CRNA Pre-anesthesia Checklist: Patient identified, Emergency Drugs available, Suction available and Patient being monitored Patient Re-evaluated:Patient Re-evaluated prior to induction Oxygen Delivery Method: Circle System Utilized Preoxygenation: Pre-oxygenation with 100% oxygen Induction Type: IV induction and Rapid sequence Ventilation: Mask ventilation without difficulty Laryngoscope Size: Miller and 1 Grade View: Grade II Tube type: Oral Tube size: 7.0 mm Number of attempts: 2 Airway Equipment and Method: Stylet and Oral airway Placement Confirmation: ETT inserted through vocal cords under direct vision,  positive ETCO2 and breath sounds checked- equal and bilateral Secured at: 21 cm Tube secured with: Tape Dental Injury: Teeth and Oropharynx as per pre-operative assessment  Difficulty Due To: Difficult Airway- due to anterior larynx Comments: DL x 1 Grade 2 view with MAC 3. Unable to pass ETT through vocal cords. DL x 2 grade 2 view with Miller 2. Pt has small mouth and anterior airway.

## 2018-05-30 NOTE — Anesthesia Postprocedure Evaluation (Signed)
Anesthesia Post Note  Patient: [name]  Procedure(s) Performed: LEFT BREAST LUMPECTOMY WITH RADIOACTIVE SEED LOCALIZATION (Left Breast) LAPAROSCOPIC CHOLECYSTECTOMY WITH INTRAOPERATIVE CHOLANGIOGRAM (N/A Abdomen)     Patient location during evaluation: PACU Anesthesia Type: General Level of consciousness: awake and alert Pain management: pain level controlled Vital Signs Assessment: post-procedure vital signs reviewed and stable Respiratory status: spontaneous breathing, nonlabored ventilation and respiratory function stable Cardiovascular status: blood pressure returned to baseline and stable Postop Assessment: no apparent nausea or vomiting Anesthetic complications: no    Last Vitals:  Vitals:   05/30/18 1515 05/30/18 1520  BP:  (!) 150/71  Pulse: 68 64  Resp: 13 16  Temp:    SpO2: 99% 99%    Last Pain:  Vitals:   05/30/18 1500  TempSrc:   PainSc: [place]

## 2018-05-30 NOTE — Transfer of Care (Signed)
Immediate Anesthesia Transfer of Care Note  Patient: [name]  Procedure(s) Performed: LEFT BREAST LUMPECTOMY WITH RADIOACTIVE SEED LOCALIZATION (Left Breast) LAPAROSCOPIC CHOLECYSTECTOMY WITH INTRAOPERATIVE CHOLANGIOGRAM (N/A Abdomen)  Patient Location: PACU  Anesthesia Type:General  Level of Consciousness: awake, alert  and oriented  Airway & Oxygen Therapy: Patient Spontanous Breathing and Patient connected to face mask oxygen  Post-op Assessment: Report given to RN and Post -op Vital signs reviewed and stable  Post vital signs: Reviewed and stable  Last Vitals:  Vitals Value Taken Time  BP 142/69 05/30/2018  2:36 PM  Temp    Pulse 83 05/30/2018  2:36 PM  Resp 18 05/30/2018  2:36 PM  SpO2 100 % 05/30/2018  2:36 PM  Vitals shown include unvalidated device data.  Last Pain:  Vitals:   05/30/18 1143  TempSrc:   PainSc: 0-No pain      Patients Stated Pain Goal: 2 (17/12/78 7183)  Complications: No apparent anesthesia complications

## 2018-05-30 NOTE — Interval H&P Note (Signed)
History and Physical Interval Note:  05/30/2018 11:33 AM  [name]  has presented today for surgery, with the diagnosis of CHOLECYSTITIS W CHOLELITHIASIS, LEFT BREAST ATYPICAL DUCTAL HYPERPLASIA.  The various methods of treatment have been discussed with the patient and family. After consideration of risks, benefits and other options for treatment, the patient has consented to  Procedure(s): LEFT BREAST LUMPECTOMY WITH RADIOACTIVE SEED LOCALIZATION (Left) LAPAROSCOPIC CHOLECYSTECTOMY POSSIBLE OPEN WITH INTRAOPERATIVE CHOLANGIOGRAM (N/A) as a surgical intervention.  The patient's history has been reviewed, patient examined, no change in status, stable for surgery.  I have reviewed the patient's chart and labs.  Questions were answered to the patient's satisfaction.     Autumn Messing III

## 2018-05-31 ENCOUNTER — Encounter (HOSPITAL_COMMUNITY): Payer: Self-pay | Admitting: General Surgery

## 2018-05-31 DIAGNOSIS — K8010 Calculus of gallbladder with chronic cholecystitis without obstruction: Secondary | ICD-10-CM | POA: Diagnosis not present

## 2018-05-31 MED ORDER — PANTOPRAZOLE SODIUM 40 MG PO TBEC: 40.0000 mg | DELAYED_RELEASE_TABLET | Freq: Every day | ORAL | Status: DC

## 2018-05-31 NOTE — Discharge Summary (Signed)
Patient ID: [name] [id number] 68 y.o. 05-11-1950  Admit date: 05/30/2018  Discharge date and time: No discharge date for patient encounter.  Admitting Physician: Twanna Hy  Discharge Physician: Adin Hector  Admission Diagnoses: CHOLECYSTITIS W CHOLELITHIASIS, LEFT BREAST ATYPICAL DUCTAL HYPERPLASIA  Discharge Diagnoses: same  Operations: Procedure(s): LEFT BREAST LUMPECTOMY WITH RADIOACTIVE SEED LOCALIZATION LAPAROSCOPIC CHOLECYSTECTOMY WITH INTRAOPERATIVE CHOLANGIOGRAM  Admission Condition: good  Discharged Condition: good  Indication for Admission: The patient is a 68 year old female who presents with abdominal pain. We are asked to see the patient in consultation by Dr. Dory Horn to evaluate her for an abnormality in the left breast as well as gallstones. The patient is a 68 year old white female who initially went for a routine screening mammogram. At that time she was found to have a 6 mm mass in the upper inner quadrant of the left breast. This was biopsied and came back as flat epithelial atypia. She denies any breast pain or discharge from the nipple. Shortly thereafter she also developed epigastric and right upper quadrant pain that radiated into the back. The pain was associated with some nausea but no vomiting. She underwent an ultrasound which did show stones in the gallbladder as well as some significant gallbladder wall thickening. Liver functions were not checked.    She is brought to the hospital for elective laparoscopic cholecystectomy and port left breast lumpectomy  Hospital Course: On the day of admission the patient was taken to the operating room and underwent laparoscopic cholecystectomy.  She also underwent left breast lumpectomy.  Final pathology is pending.  She was admitted overnight for observation and did very well.  On postop day 1 she was well and was asking to go home.  She was ambulatory tolerating diet and had minimal pain.   Examination of her breast wound and abdominal wounds showed uneventful healing without complication.  She was given instruction in diet and activities.  She was asked to call and make an arrangement for a postop visit in the office with Dr. Marlou Starks.  All of her questions were answered.  Consults: None  Significant Diagnostic Studies: Surgical pathology, pending  Treatments: surgery: Left breast lumpectomy and laparoscopic cholecystectomy  Disposition: Home  Patient Instructions:  Allergies as of 05/31/2018      Reactions   Metoclopramide Hcl Other (See Comments)   Facial tightness    Penicillins Hives   Did it involve swelling of the face/tongue/throat, SOB, or low BP? No Did it involve sudden or severe rash/hives, skin peeling, or any reaction on the inside of your mouth or nose? No Did you need to seek medical attention at a hospital or doctor's office? No When did it last happen? ~40 years ago If all above answers are "NO", may proceed with cephalosporin use.   Shellfish Allergy Hives   Sulfamethoxazole-trimethoprim Hives   Ciprofloxacin Palpitations   Nickel Rash      Medication List    TAKE these medications   acetaminophen 500 MG tablet Commonly known as:  TYLENOL Take 1,000 mg by mouth every 6 (six) hours as needed (pain.).   Aczone 7.5 % Gel Generic drug:  Dapsone Apply 1 application topically at bedtime.   albuterol 108 (90 Base) MCG/ACT inhaler Commonly known as:  ProAir HFA Inhale 1-2 puffs into the lungs every 4 (four) hours as needed. What changed:  reasons to take this   Biotin 5000 MCG Tabs Take 5,000 mcg by mouth daily.   Cinnamon 500 MG capsule Take  500 mg by mouth every evening.   clobetasol 0.05 % external solution Commonly known as:  TEMOVATE Apply 1 application topically at bedtime. Applied to scalp   conjugated estrogens vaginal cream Commonly known as:  PREMARIN Place 1 Applicatorful vaginally 2 (two) times a week. Mondays & Thursdays.    estradiol 1 MG tablet Commonly known as:  ESTRACE Take 1 mg by mouth daily.   famotidine 20 MG tablet Commonly known as:  PEPCID Take 20 mg by mouth 2 (two) times daily.   fexofenadine 180 MG tablet Commonly known as:  ALLEGRA Take 180 mg by mouth at bedtime.   Flax Seed Oil 1000 MG Caps Take 1,000 mg by mouth at bedtime.   fluticasone 50 MCG/ACT nasal spray Commonly known as:  FLONASE Place 2 sprays into both nostrils daily. What changed:  when to take this   hydrochlorothiazide 25 MG tablet Commonly known as:  HYDRODIURIL Take 25 mg by mouth daily as needed (fluid retention.).   HYDROcodone-acetaminophen 5-325 MG tablet Commonly known as:  NORCO/VICODIN Take 1-2 tablets by mouth every 6 (six) hours as needed for moderate pain or severe pain.   magnesium oxide 400 MG tablet Commonly known as:  MAG-OX Take 400 mg by mouth every evening.   montelukast 10 MG tablet Commonly known as:  SINGULAIR take 1 tablet by mouth once daily What changed:  when to take this   polycarbophil 625 MG tablet Commonly known as:  FIBERCON Take 625 mg by mouth daily.   PROBIOTIC PO Take 1 capsule by mouth daily.   Spiriva HandiHaler 18 MCG inhalation capsule Generic drug:  tiotropium inhale THE CONTENTS OF ONE CAPSULE IN THE HANDIHALER once daily What changed:  See the new instructions.   Systane 0.4-0.3 % Soln Generic drug:  Polyethyl Glycol-Propyl Glycol Place 1 drop into both eyes 4 (four) times daily as needed (dry/irritated eyes.).   TURMERIC PO Take 1,000 mg by mouth at bedtime.   verapamil 180 MG 24 hr capsule Commonly known as:  VERELAN PM Take 180 mg by mouth at bedtime.   vitamin B-12 1000 MCG tablet Commonly known as:  CYANOCOBALAMIN Take 1,000 mcg by mouth at bedtime.   Vitamin D-3 125 MCG (5000 UT) Tabs Take 5,000 Units by mouth at bedtime.       Activity: no heavy lifting for 3 weeks Diet: low fat, low cholesterol diet Wound Care: as  directed  Follow-up:  With Dr. Marlou Starks in 2 weeks.  Signed: Edsel Petrin. Dalbert Batman, M.D., FACS General and minimally invasive surgery Breast and Colorectal Surgery  05/31/2018, 8:50 AM

## 2018-07-21 ENCOUNTER — Telehealth: Payer: Self-pay | Admitting: Oncology

## 2018-07-21 NOTE — Telephone Encounter (Signed)
Received a new referral for the high risk breast clinic from Dr. Marlou Starks for [name]. Pt has been cld and scheduled to see Dr. Jana Hakim on 8/3 at 4pm. Pt aware to arrive 20 minutes early.

## 2018-08-03 NOTE — Progress Notes (Signed)
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  CURRENT TREATMENT: tamoxifen; intensified screening   HISTORY OF CURRENT ILLNESS:  "[name]" had routine screening mammography at Dr. Verlon Au clinic showing a possible assymetry in the left breast. She underwent unilateral left diagnostic mammography with tomography and left breast ultrasonography at [facility] on 03/11/2018 showing: Breast Density Category B. Spot compression tomosynthesis images through the region of concern in the superior left breast demonstrates a persistent asymmetry measuring approximately 0.9 cm. There is no associated distortion or suspicious calcifications. Ultrasound of the left breast at 11 o'clock, 7 cm from the nipple demonstrates an irregular hypoechoic mass measuring 0.6 x 0.4 x 0.5 cm. Ultrasound of the left axilla demonstrates multiple normal-appearing lymph nodes.  Accordingly on 03/12/2018 she proceeded to biopsy of the left breast area in question. The pathology from this procedure showed ([id number]): Flat epithelial atypia with background columnar cell hyperplasia, 11 o'clock.  This was felt to be discordant.  She underwent a left lumpectomy and a laparoscopic cholecystectomy on 05/30/2018. The pathology from this procedure showed ([id number]): 1. Breast, lumpectomy, left with radioactive seed - Biopsy site changes. Columnar cell hyperplasia.  Flat epithelial atypia. Lobular neoplasia (atypical lobular hyperplasia) 2. Gallbladder   - Chronic cholecystitis with cholelithiasis  The patient's subsequent history is as detailed below.   INTERVAL HISTORY: Jeannene Patella was evaluated in the high risk breast cancer clinic on 08/04/2018.   REVIEW OF SYSTEMS: [name] exercises chiefly by walking, usually 2 to 3 miles, most days. She denies unusual headaches, visual changes, nausea, vomiting, stiff neck, dizziness, or gait imbalance.  She has mild seasonal asthma but there has been no cough, phlegm production, or pleurisy, no chest pain or pressure, and no change in bowel or bladder habits.  No gastroparesis is well controlled.  The patient denies fever, rash, bleeding, unexplained fatigue or unexplained weight loss. A detailed review of systems was otherwise entirely negative.   PAST MEDICAL HISTORY: Past Medical History:  Diagnosis Date   Allergy    Anemia    Asthma    Depression    GERD (gastroesophageal reflux disease)    Headache    Palpitations 2016   Cardiolite neg 2005, echo 2011 EF 55%  Gastroparesis possibly secondary to remote pyloric stenosis correction   PAST SURGICAL HISTORY: Past Surgical History:  Procedure Laterality Date   ABDOMINAL HYSTERECTOMY     BREAST LUMPECTOMY WITH RADIOACTIVE SEED LOCALIZATION Left 05/30/2018   Procedure: LEFT BREAST LUMPECTOMY WITH RADIOACTIVE SEED LOCALIZATION;  Surgeon: Jovita Kussmaul, MD;  Location: [facility];  Service: General;  Laterality: Left;   BUNIONECTOMY     CESAREAN SECTION     CHOLECYSTECTOMY N/A 05/30/2018   Procedure: LAPAROSCOPIC CHOLECYSTECTOMY WITH INTRAOPERATIVE CHOLANGIOGRAM;  Surgeon: Jovita Kussmaul, MD;  Location: [facility];  Service: General;  Laterality: N/A;   pyloric stenosis     at 87 weeks old   [facility]    Bilateral salpingo-oophorectomy   FAMILY HISTORY: Family History  Problem Relation Age of Onset    Hypertension Mother    Cancer Mother        Uterine and Breast   Cerebral aneurysm Mother    Arthritis Father    Hypertension Father    Glaucoma Father    [name]'s father died from Alzheimer's disease at age 68.  He had a history of bladder cancer.  Patients' mother is living at the age of 68 as of August 2020.   She was diagnosed with breast cancer in her 71s and also uterine cancer. The patient has 1 sister, no brothers. Patient denies anyone in her family having ovarian, prostate, or pancreatic cancer.    GYNECOLOGIC HISTORY:  No LMP recorded. Patient has had a hysterectomy. 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 Her husband, Quita Skye, is a Engineer, maintenance (IT). Her son, Simona Huh, lives in [place], is an Optometrist, is married and has 2 children. Her daughter, Lysbeth Galas, is an Scientist, physiological that works with farmer education near [place].  She has 1 child and 1 on the way. [name] belongs to a [employer].    ADVANCED DIRECTIVES: In the absence of any documentation, [name]'s spouse, Quita Skye, is automatically her healthcare power of attorney.      HEALTH MAINTENANCE: Social History   Tobacco Use   Smoking status: Never Smoker   Smokeless tobacco: Never Used  Substance Use Topics   Alcohol use: No   Drug use: No    Colonoscopy: 2017/[facility]  PAP: Status post hysterectomy  Bone density: Due in 09/2018 Mammography: Up to date  Allergies  Allergen Reactions   Fish Allergy Hives   Metoclopramide Hcl Other (See Comments)    Facial tightness    Penicillins Hives    Did it involve swelling of the face/tongue/throat, SOB, or low BP? No Did it involve sudden or severe rash/hives, skin peeling, or any reaction on the inside of your mouth or nose? No Did you need to seek medical attention at a hospital or doctor's  office? No When did it last happen? ~40 years ago If all above answers are NO, may proceed with cephalosporin use.    Shellfish Allergy Hives   Sulfamethoxazole-Trimethoprim Hives   Ciprofloxacin Palpitations   Nickel Rash    Current Outpatient Medications  Medication Sig Dispense Refill   acetaminophen (TYLENOL) 500 MG tablet Take 1,000 mg by mouth every 6 (six) hours as needed (pain.).     ACZONE 7.5 % GEL Apply 1 application topically at bedtime.     albuterol (PROAIR HFA) 108 (90 BASE) MCG/ACT inhaler Inhale 1-2 puffs into the lungs every 4 (four) hours as needed. (Patient taking differently: Inhale 1-2 puffs into the lungs every 4 (four) hours as needed (asthma/wheezing/shortness of breath.). ) 18 g 3   Biotin 5000 MCG TABS Take 5,000 mcg by mouth daily.     Cholecalciferol (VITAMIN D-3) 125 MCG (5000 UT) TABS Take 5,000 Units by mouth at bedtime.     Cinnamon 500 MG capsule Take 500 mg by mouth every evening.      clobetasol (TEMOVATE) 0.05 % external solution Apply 1 application topically at bedtime. Applied to scalp     conjugated estrogens (PREMARIN) vaginal cream Place 1 Applicatorful vaginally 2 (two) times a week. Mondays & Thursdays.     famotidine (PEPCID) 20 MG tablet Take  20 mg by mouth 2 (two) times daily.     fexofenadine (ALLEGRA) 180 MG tablet Take 180 mg by mouth at bedtime.     Flaxseed, Linseed, (FLAX SEED OIL) 1000 MG CAPS Take 1,000 mg by mouth at bedtime.     magnesium oxide (MAG-OX) 400 MG tablet Take 400 mg by mouth every evening.      montelukast (SINGULAIR) 10 MG tablet take 1 tablet by mouth once daily (Patient taking differently: Take 10 mg by mouth at bedtime. ) 90 tablet 3   polycarbophil (FIBERCON) 625 MG tablet Take 625 mg by mouth daily.     Probiotic Product (PROBIOTIC PO) Take 1 capsule by mouth daily.     SPIRIVA HANDIHALER 18 MCG inhalation capsule inhale THE CONTENTS OF ONE CAPSULE IN THE HANDIHALER once daily (Patient taking  differently: Place 18 mcg into inhaler and inhale daily. ) 30 capsule 0   SYSTANE 0.4-0.3 % SOLN Place 1 drop into both eyes 4 (four) times daily as needed (dry/irritated eyes.).     TURMERIC PO Take 1,000 mg by mouth at bedtime.     verapamil (VERELAN PM) 180 MG 24 hr capsule Take 180 mg by mouth at bedtime.     vitamin B-12 (CYANOCOBALAMIN) 1000 MCG tablet Take 1,000 mcg by mouth at bedtime.      fluticasone (FLONASE) 50 MCG/ACT nasal spray Place 2 sprays into both nostrils daily. (Patient not taking: Reported on 08/04/2018) 16 g 6   hydrochlorothiazide (HYDRODIURIL) 25 MG tablet Take 25 mg by mouth daily as needed (fluid retention.).     Current Facility-Administered Medications  Medication Dose Route Frequency Provider Last Rate Last Dose   ipratropium-albuterol (DUONEB) 0.5-2.5 (3) MG/3ML nebulizer solution 3 mL  3 mL Nebulization Once Kelby Aline, PA-C         OBJECTIVE: Middle aged white woman in no acute distress  Vitals:   08/04/18 1540  BP: (!) 139/59  Pulse: 75  Resp: 18  Temp: 98.2 F (36.8 C)  SpO2: 98%     Body mass index is 26.62 kg/m.   Wt Readings from Last 3 Encounters:  08/04/18 157 lb 8 oz (71.4 kg)  05/31/18 156 lb 1.4 oz (70.8 kg)  05/22/18 156 lb (70.8 kg)      ECOG FS:1 - Symptomatic but completely ambulatory  Ocular: Sclerae unicteric, pupils round and equal Ear-nose-throat: Wearing a mark Lymphatic: No cervical or supraclavicular adenopathy Lungs no rales or rhonchi Heart regular rate and rhythm Abd soft, nontender, positive bowel sounds MSK no focal spinal tenderness, no joint edema Neuro: non-focal, well-oriented, appropriate affect Breasts: The right breast is unremarkable.  The left breast is status post recent lumpectomy.  Incision is healing nicely.  The cosmetic result is excellent.  There are no suspicious findings in either breast.  Both axillae are benign   LAB RESULTS:  CMP     Component Value Date/Time   NA 135 05/22/2018  1042   K 4.0 05/22/2018 1042   CL 99 05/22/2018 1042   CO2 29 05/22/2018 1042   GLUCOSE 96 05/22/2018 1042   BUN 11 05/22/2018 1042   CREATININE 0.95 05/22/2018 1042   CREATININE 0.90 02/25/2015 1042   CALCIUM 9.4 05/22/2018 1042   PROT 7.2 05/22/2018 1042   ALBUMIN 3.8 05/22/2018 1042   AST 22 05/22/2018 1042   ALT 16 05/22/2018 1042   ALKPHOS 63 05/22/2018 1042   BILITOT 0.5 05/22/2018 1042   GFRNONAA >60 05/22/2018 1042   GFRNONAA 68 02/25/2015  [place] 05/22/2018 1042   GFRAA 78 02/25/2015 1042    No results found for: TOTALPROTELP, ALBUMINELP, A1GS, A2GS, BETS, BETA2SER, GAMS, MSPIKE, SPEI  No results found for: KPAFRELGTCHN, LAMBDASER, KAPLAMBRATIO  Lab Results  Component Value Date   WBC 7.9 05/22/2018   NEUTROABS 2.6 02/25/2015   HGB 12.1 05/22/2018   HCT 37.3 05/22/2018   MCV 90.8 05/22/2018   PLT 361 05/22/2018    No results found for: LABCA2  No components found for: QASTMH962  No results for input(s): INR in the last 168 hours.  No results found for: LABCA2  No results found for: IWL798  No results found for: XQJ194  No results found for: RDE081  No results found for: CA2729  No components found for: HGQUANT  No results found for: CEA1 / No results found for: CEA1   No results found for: AFPTUMOR  No results found for: CHROMOGRNA  No results found for: PSA1  No visits with results within 3 Day(s) from this visit.  Latest known visit with results is:  Admission on 05/30/2018, Discharged on 05/31/2018  Component Date Value Ref Range Status   Glucose-Capillary 05/30/2018 129* 70 - 99 mg/dL Final   Glucose-Capillary 05/30/2018 183* 70 - 99 mg/dL Final    (this displays the last labs from the last 3 days)  No results found for: TOTALPROTELP, ALBUMINELP, A1GS, A2GS, BETS, BETA2SER, GAMS, MSPIKE, SPEI (this displays SPEP labs)  No results found for: KPAFRELGTCHN, LAMBDASER, KAPLAMBRATIO (kappa/lambda light chains)  No results  found for: HGBA, HGBA2QUANT, HGBFQUANT, HGBSQUAN (Hemoglobinopathy evaluation)   No results found for: LDH  Lab Results  Component Value Date   IRON 81 08/17/2014   TIBC 341 08/17/2014   IRONPCTSAT 24 08/17/2014   (Iron and TIBC)  Lab Results  Component Value Date   FERRITIN 21 08/17/2014    Urinalysis    Component Value Date/Time   COLORURINE YELLOW 09/20/2014 1143   APPEARANCEUR CLEAR 09/20/2014 1143   LABSPEC 1.019 09/20/2014 1143   PHURINE 6.5 09/20/2014 1143   GLUCOSEU NEGATIVE 09/20/2014 1143   HGBUR NEGATIVE 09/20/2014 1143   BILIRUBINUR negative 02/12/2015 1457   KETONESUR negative 02/12/2015 1457   KETONESUR NEGATIVE 09/20/2014 1143   PROTEINUR negative 02/12/2015 1457   PROTEINUR NEGATIVE 09/20/2014 1143   UROBILINOGEN 0.2 02/12/2015 1457   UROBILINOGEN 0.2 05/11/2013 1029   NITRITE Negative 02/12/2015 1457   NITRITE NEGATIVE 09/20/2014 1143   LEUKOCYTESUR small (1+) (A) 02/12/2015 1457     STUDIES:  No results found.   ELIGIBLE FOR AVAILABLE RESEARCH PROTOCOL: no   ASSESSMENT: 68 y.o. [place], [place] woman status post left lumpectomy 05/30/2018 showing atypical lobular hyperplasia --lifetime breast cancer risk 20-25%  (1) risk reduction: Tamoxifen started 08/04/2018  (2) intensified screening:  (a) breast MRI SEPT  (b) mammography MARCH  (c) biannual breast exam   PLAN:  We spent the better part of today's 50-minute appointment discussing the biology of breast cancer in general, and the specifics of atypical lobular hyperplasia [ALH] more specifically. [name] understands ALH means, first, relatively unrestricted growth of glandular breast cells and, in addition, some morphologically different features from simple hyperplasia. Atypical lobular hyperplasia is not breast cancer but can be associated with invasive cancer and for that reason lumpectomy was necessary.  Luckily in her case no invasive disease was noted. However ALH is also a marker of  breast cancer risk. The risk of developing breast cancer in patients with ALH approaches 1% per year. The  new cancer can develop in either breast. One half of those tumors would be invasive.  In addition, other factors that increase her breast cancer risk include family history.  Overall her risk of developing breast cancer in her lifetime is in the 20-25% range.  [facility] has essentially two ways of lowering that risk. One of them is bilateral mastectomies.  This would involve major surgery, likely requiring reconstruction, which may or may not be successful in the patient's eyes..  It may or may not be covered by her insurance.  More importantly there is no evidence of a survival advantage with this approach.  This is strongly discouraged  The other, more reasonable risk reduction approach would be to take anti-estrogens for 5 years.  Options include tamoxifen, raloxifene/Evista, or an aromatase inhibitor. Any of these agents if taken for 5 years would cut the risk of developing a new breast cancer in half.  We then discussed the possible toxicities, side effects and complications of the tamoxifen group as opposed to the aromatase inhibitors.  This information was given to [facility] in writing.  She is interested in tamoxifen and she is a good candidate for it because she is status post hysterectomy and because she took systemic estrogens for many years with no clotting issues.  In addition she is interested in continuing vaginal estrogens and that would not be parts under aromatase inhibitors.    We then discussed intensified screening. This would mean adding a yearly MRI to yearly mammography with tomography. This approach greatly increases sensitivity. Any breast cancer that may develop should be found at the earliest possible stage.  The main concern with this approach is the possibility of "false positives " requiring repeated studies, biopsies, etc.  Also there are issues regarding cost even if partial  insurance coverage is obtained.  Finally there is no documented survival advantage to this approach in this population.  [name] is very interested in proceeding with this approach, which she also discussed with Dr. Marlou Starks previously.  Finally, [name] can reduce her risk of cancer in general (not breast cancer specifically) by exercising 45 minutes at least 5 times a week, and keeping a diet high in vegetables and low in carbohydrates.  She is already well on her way towards that.  After this discussion [name] would like to proceed with tamoxifen and I have placed that prescription in for her.  She can continue vaginal estrogens undercover of tamoxifen but I have asked her to stop her oral estradiol replacement.  She will be set up for an MRI of the breast in September and see me in October.  If she is tolerating tamoxifen well at that time, the plan would be for mammography in March and to see Dr. Marlou Starks in April, and then breast MRI in September and likely see Dr. Nori Riis shortly after that.  That would give her in addition to breast exams yearly.  Tamee has a good understanding of this plan.  She agrees with it.  She knows the goal of treatment in this case is prevention.  She will call with any other issues that may develop before her next visit here.    Yader Criger, Virgie Dad, MD  08/04/18 5:05 PM Medical Oncology and Hematology [facility] [address] [place], [place] [address] Tel. [phone]    Fax. [phone]   I, Jacqualyn Posey am acting as a Education administrator for Chauncey Cruel, MD.   I, Lurline Del MD, have reviewed the above documentation  for accuracy and completeness, and I agree with the above.

## 2018-08-04 ENCOUNTER — Encounter: Payer: Self-pay | Admitting: Oncology

## 2018-08-04 ENCOUNTER — Inpatient Hospital Stay: Payer: Medicare Other | Attending: Oncology | Admitting: Oncology

## 2018-08-04 ENCOUNTER — Other Ambulatory Visit: Payer: Self-pay

## 2018-08-04 DIAGNOSIS — Z1239 Encounter for other screening for malignant neoplasm of breast: Secondary | ICD-10-CM

## 2018-08-04 DIAGNOSIS — N6092 Unspecified benign mammary dysplasia of left breast: Secondary | ICD-10-CM | POA: Insufficient documentation

## 2018-08-04 DIAGNOSIS — Z9189 Other specified personal risk factors, not elsewhere classified: Secondary | ICD-10-CM | POA: Insufficient documentation

## 2018-08-04 DIAGNOSIS — Z1231 Encounter for screening mammogram for malignant neoplasm of breast: Secondary | ICD-10-CM | POA: Diagnosis not present

## 2018-08-04 DIAGNOSIS — Z79810 Long term (current) use of selective estrogen receptor modulators (SERMs): Secondary | ICD-10-CM | POA: Diagnosis not present

## 2018-08-04 MED ORDER — TAMOXIFEN CITRATE 20 MG PO TABS: 20.0000 mg | ORAL_TABLET | Freq: Every day | ORAL | 12 refills | Status: AC

## 2018-08-05 ENCOUNTER — Telehealth: Payer: Self-pay | Admitting: Oncology

## 2018-08-05 NOTE — Telephone Encounter (Signed)
I left a message regarding schedule  

## 2018-09-12 ENCOUNTER — Ambulatory Visit
Admission: RE | Admit: 2018-09-12 | Discharge: 2018-09-12 | Disposition: A | Discharge status: Home / Self Care | Payer: Medicare Other | Source: Ambulatory Visit | Attending: Oncology | Admitting: Oncology

## 2018-09-12 ENCOUNTER — Other Ambulatory Visit: Payer: Self-pay

## 2018-09-12 DIAGNOSIS — Z1231 Encounter for screening mammogram for malignant neoplasm of breast: Secondary | ICD-10-CM

## 2018-09-12 IMAGING — MR MR BREAST BILAT WO/W CM
8 of 12 series · 33 of 48 positions shown · IV contrast (7ml gadavist)
Comparison: Previous exam(s).

CLINICAL DATA: High risk screening breast MRI. Family history
breast cancer in mother diagnosed at age 72. Excisional left breast
biopsy [DATE] demonstrating flat epithelial atypia and ALH.

LABS:  Creatinine was obtained on site at [HOSPITAL] at [REDACTED] [HOSPITAL].
Results: Creatinine 1.0 mg/dL.  GFR 58.
EXAM:
BILATERAL BREAST MRI WITH AND WITHOUT CONTRAST
TECHNIQUE: Multiplanar, multisequence MR images of both breasts were obtained
prior to and following the intravenous administration of 7 ml of
Gadavist

[Series 2: t2_tirm_tra ipat (a-p) · axial · 3.0mm · 0.76mm/px · 1 of 62 slices shown]
[im 1/62]
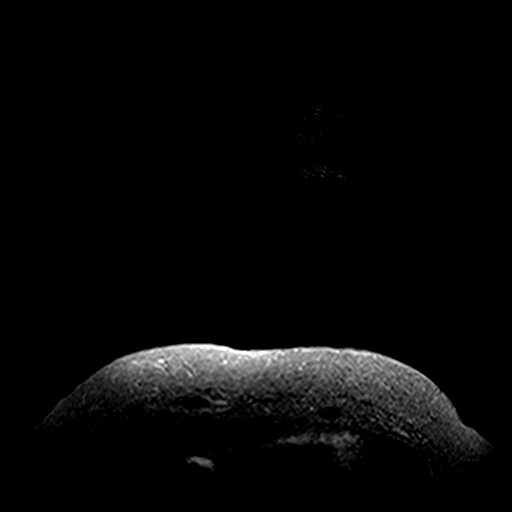

[Series 3: fl3d pre-cm no · axial · non-contrast · 1.2mm · 1.02mm/px · z∈[-92,+99]mm · 4 of 160 slices shown]
[im 1/160]
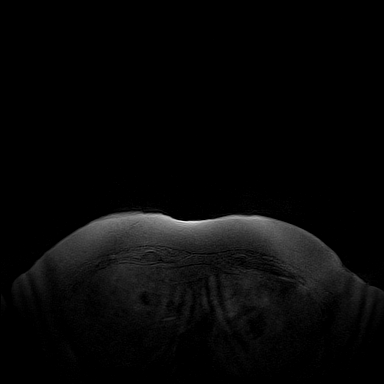
[im 54/160]
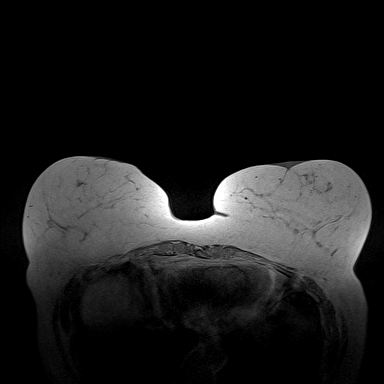
[im 107/160]
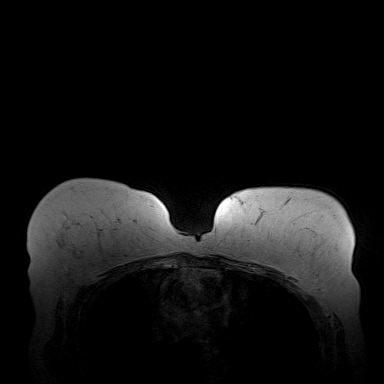
[im 160/160]
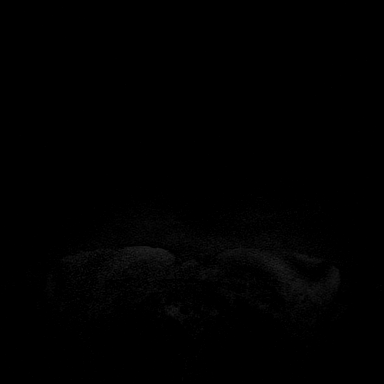

[Series 5: fl3d pre-cm · axial · non-contrast · 1.2mm · 1.02mm/px · z∈[-101,+109]mm · 5 of 176 slices shown]
[im 1/176]
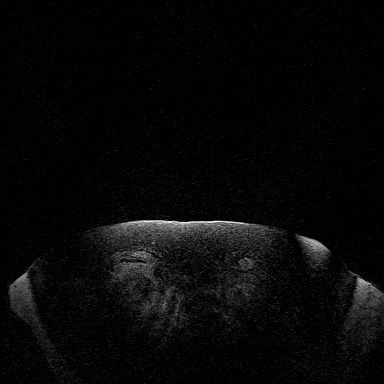
[im 44/176]
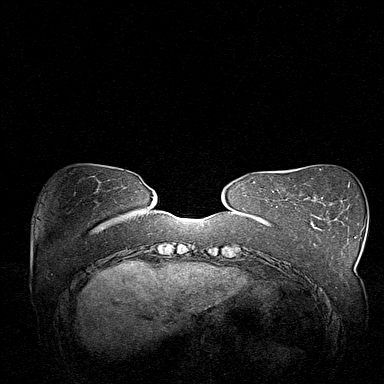
[im 88/176]
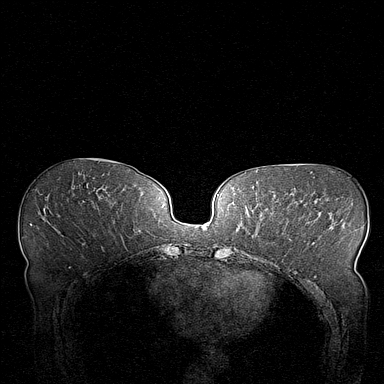
[im 132/176]
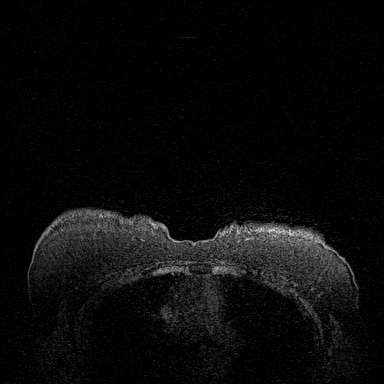
[im 176/176]
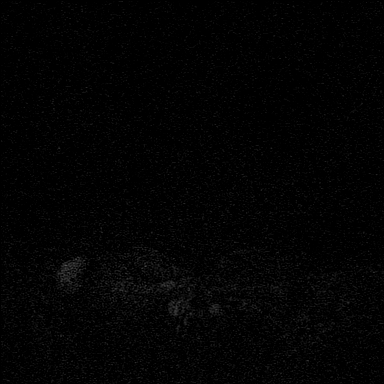

[Series 8: fl3d post-cm 20 · axial · 1.2mm · 1.02mm/px · z∈[-101,+109]mm · 5 of 176 slices shown (1 of 3)]
[im 1/176]
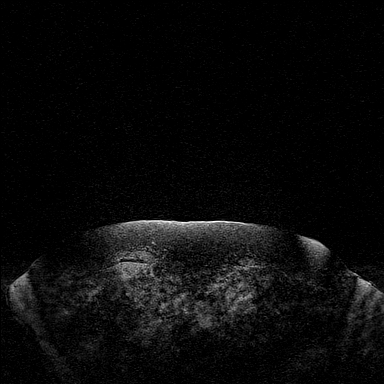
[im 44/176]
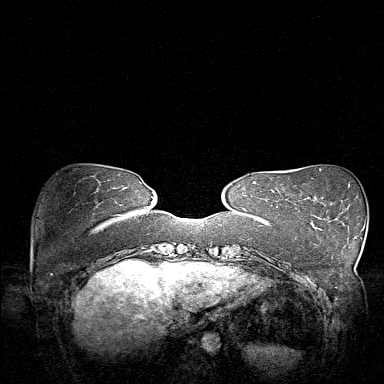
[im 88/176]
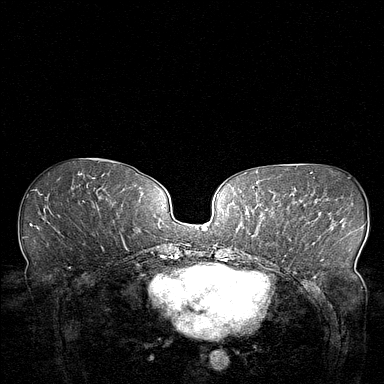
[im 132/176]
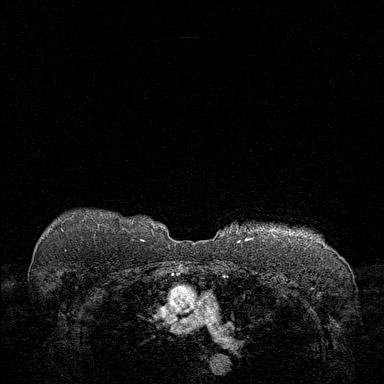
[im 176/176]
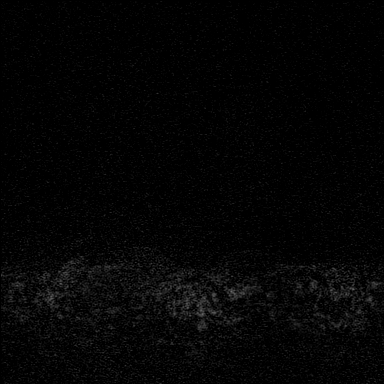

[Series 9: fl3d post-cm 20 · axial · 1.2mm · 1.02mm/px · z∈[-101,+109]mm · 6 of 176 slices shown (2 of 3)]
[im 1/176]
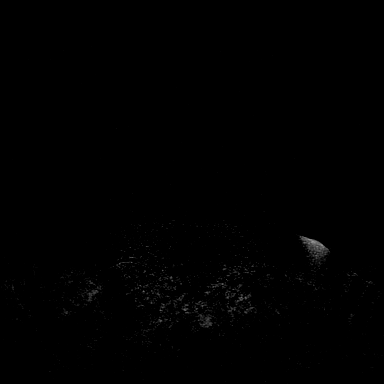
[im 36/176]
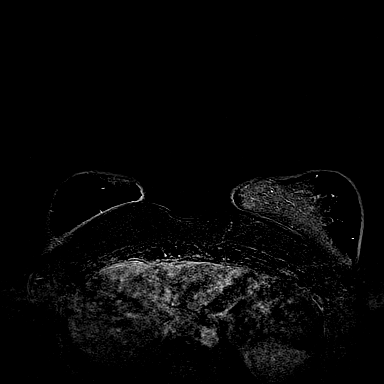
[im 71/176]
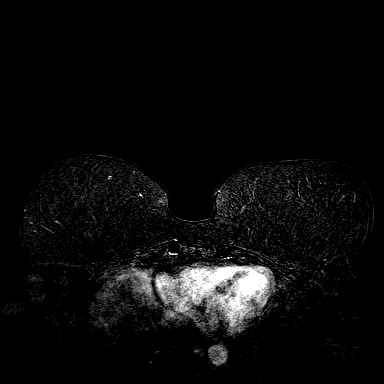
[im 106/176]
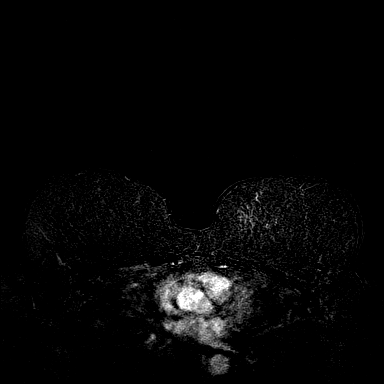
[im 141/176]
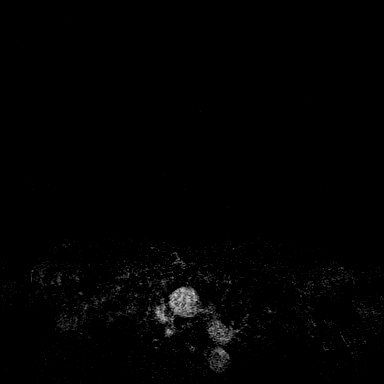
[im 176/176]
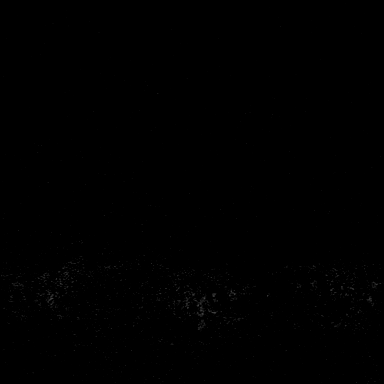

[Series 10: fl3d post-cm 20 · axial · 211.2mm · 1.02mm/px · 1 of 1 slices shown (3 of 3)]
[im 1/1]
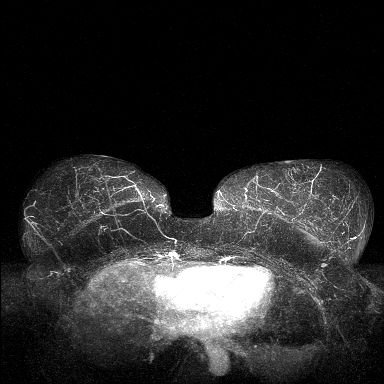

[Series 11: fl3d post-cm 3min · axial · 1.2mm · 1.02mm/px · z∈[-101,+109]mm · 6 of 176 slices shown]
[im 1/176]
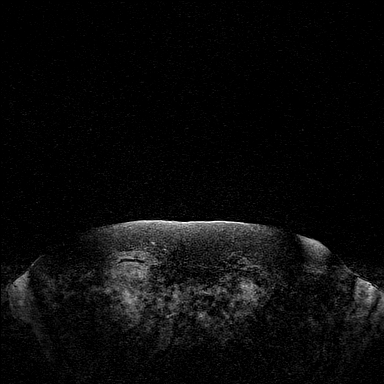
[im 36/176]
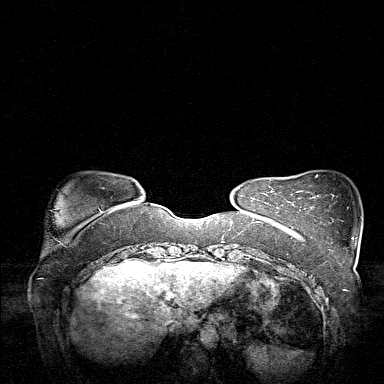
[im 71/176]
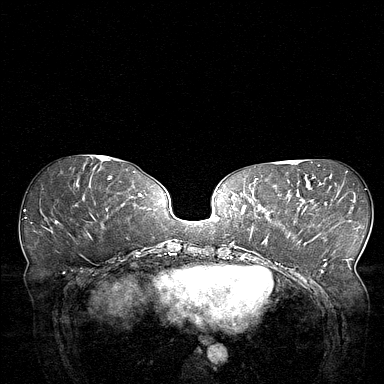
[im 106/176]
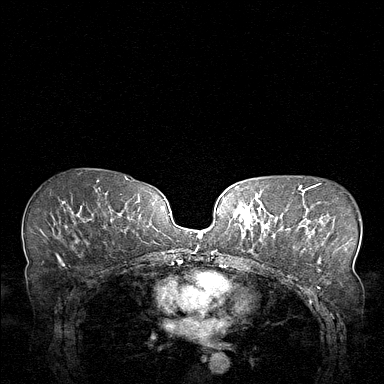
[im 141/176]
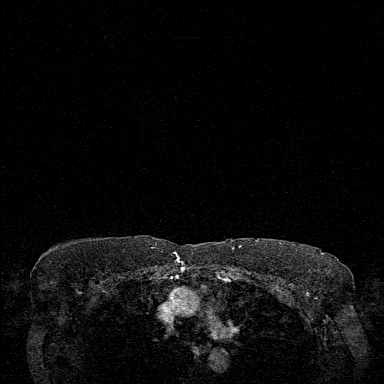
[im 176/176]
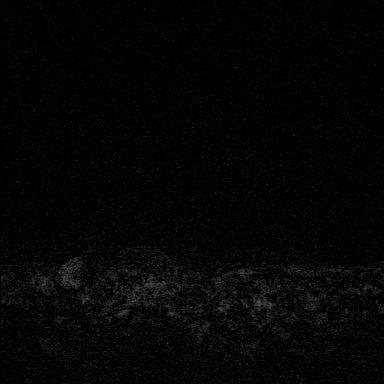

[Series 12: fl3d post-cm 3min_sub · axial · 1.2mm · 1.02mm/px · z∈[-101,+67]mm · 5 of 176 slices shown]
[im 1/176]
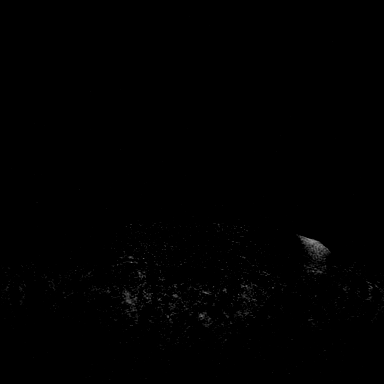
[im 36/176]
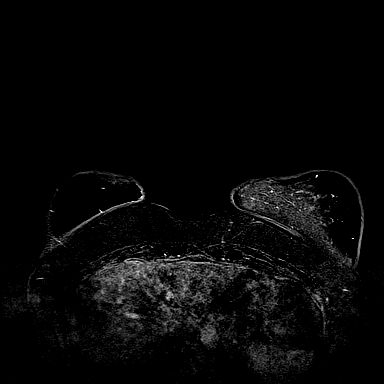
[im 71/176]
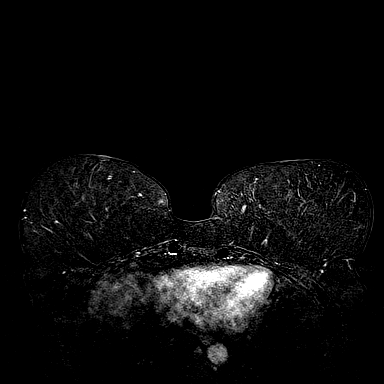
[im 106/176]
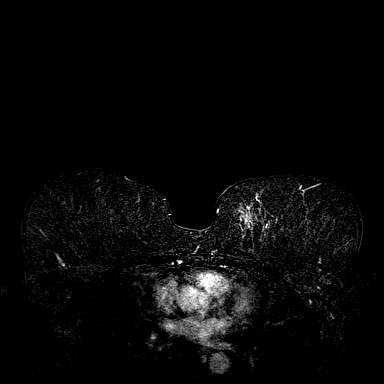
[im 141/176]
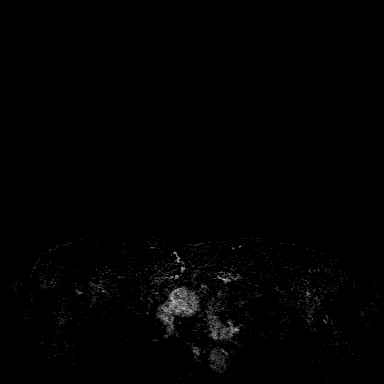

[33 of 48 positions shown; findings below may reference images not displayed]

Three-dimensional MR images were rendered by post-processing of the
original MR data on an independent workstation. The
three-dimensional MR images were interpreted, and findings are
reported in the following complete MRI report for this study. Three
dimensional images were evaluated at the independent [employer]
workstation
FINDINGS: Breast composition: b. Scattered fibroglandular tissue.

Background parenchymal enhancement: Mild.

Right breast: No mass or abnormal enhancement.

Left breast: No mass or abnormal enhancement. Subtle post biopsy
change over the upper inner quadrant of the left breast.

Lymph nodes: No abnormal appearing lymph nodes.

Ancillary findings:  None.
IMPRESSION: Post biopsy change over the upper inner quadrant of the left breast.
Otherwise, normal breast MRI without evidence of malignancy.

RECOMMENDATION:
Recommend continued annual bilateral screening mammographic
follow-up.

The American Cancer Society recommends annual MRI and mammography in
patients with an estimated lifetime risk of developing breast cancer
greater than 20 - 25%, or who are known or suspected to be positive
for the breast cancer gene.

BI-RADS CATEGORY  1: Negative.

## 2018-09-12 MED ORDER — GADOBUTROL 1 MMOL/ML IV SOLN
7.0000 mL | Freq: Once | INTRAVENOUS | Status: AC | PRN
  Administered 2018-09-12: 7 mL via INTRAVENOUS

## 2018-09-16 ENCOUNTER — Telehealth: Payer: Self-pay

## 2018-09-16 NOTE — Telephone Encounter (Signed)
Spoke with patient to inform that breast MRI is normal.  Patient voiced understanding and thanks for call.

## 2018-09-16 NOTE — Telephone Encounter (Signed)
-----   Message from Gardenia Phlegm, NP sent at 09/15/2018  3:26 PM EDT ----- Breast MRI normal please notify paitent ----- Message ----- From: Interface, Rad Results In Sent: 09/12/2018  12:20 PM EDT To: Chauncey Cruel, MD

## 2018-10-01 ENCOUNTER — Other Ambulatory Visit: Payer: Self-pay

## 2018-10-01 DIAGNOSIS — Z20822 Contact with and (suspected) exposure to covid-19: Secondary | ICD-10-CM

## 2018-10-03 LAB — NOVEL CORONAVIRUS, NAA: SARS-CoV-2, NAA: NOT DETECTED

## 2018-11-03 NOTE — Progress Notes (Signed)
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  CURRENT TREATMENT: tamoxifen; intensified screening   INTERVAL HISTORY: [name] returns today for follow up of her high risk for breast cancer.  Since her last visit, she underwent bilateral breast MRI on 09/12/2018, which showed: breast composition B; no evidence of malignancy.  She underwent coronavirus testing on 10/01/2018, which was negative.  She started tamoxifen in August.  She had some headaches initially but these are already fading.  She never had significant problems with cramps.  Hot flashes are minimal.  She has no vaginal wetness.  She has no other side effects from this medication that she is aware of   REVIEW OF SYSTEMS: [name] walks approximately 2-1/2 miles most days.  She is currently helping with her 63-year-old grandson schooling which is an issue because of having to our classes virtually at that age.  The family is being very careful regarding staying safe from the virus.  Aside from those issues a detailed review of systems today was stable.   HISTORY OF CURRENT ILLNESS:  From the original intake note:  "[name]" had routine screening mammography at Dr. Verlon Au clinic showing a possible assymetry in the left breast. She underwent unilateral left diagnostic mammography with tomography and left breast ultrasonography at [facility] on 03/11/2018 showing: Breast  Density Category B. Spot compression tomosynthesis images through the region of concern in the superior left breast demonstrates a persistent asymmetry measuring approximately 0.9 cm. There is no associated distortion or suspicious calcifications. Ultrasound of the left breast at 11 o'clock, 7 cm from the nipple demonstrates an irregular hypoechoic mass measuring 0.6 x 0.4 x 0.5 cm. Ultrasound of the left axilla demonstrates multiple normal-appearing lymph nodes.  Accordingly on 03/12/2018 she proceeded to biopsy of the left breast area in question. The pathology from this procedure showed ([id number]): Flat epithelial atypia with background columnar cell hyperplasia, 11 o'clock.  This was felt to be discordant.  She underwent a left lumpectomy and a laparoscopic cholecystectomy on 05/30/2018. The pathology from this procedure showed [id number]): 1. Breast, lumpectomy, left with radioactive seed - Biopsy site changes. Columnar cell hyperplasia. Flat epithelial atypia. Lobular neoplasia (atypical lobular hyperplasia) 2. Gallbladder   - Chronic cholecystitis with cholelithiasis  The patient's subsequent history is as detailed below.   PAST MEDICAL HISTORY: Past Medical History:  Diagnosis Date  . Allergy   . Anemia   . Asthma   . Depression   . GERD (gastroesophageal reflux disease)   . Headache   . Palpitations 2016   Cardiolite neg 2005, echo 2011 EF 55%  Gastroparesis possibly secondary to remote pyloric stenosis correction   PAST SURGICAL HISTORY: Past Surgical History:  Procedure Laterality Date  . ABDOMINAL HYSTERECTOMY    . BREAST LUMPECTOMY WITH RADIOACTIVE SEED LOCALIZATION Left 05/30/2018   Procedure: LEFT BREAST LUMPECTOMY WITH RADIOACTIVE SEED LOCALIZATION;  Surgeon: Jovita Kussmaul, MD;  Location: [facility];  Service: General;  Laterality: Left;  . BUNIONECTOMY    . CESAREAN SECTION    .  CHOLECYSTECTOMY N/A 05/30/2018   Procedure: LAPAROSCOPIC CHOLECYSTECTOMY WITH  INTRAOPERATIVE CHOLANGIOGRAM;  Surgeon: Jovita Kussmaul, MD;  Location: [facility];  Service: General;  Laterality: N/A;  . pyloric stenosis     at 15 weeks old  . TEMPOROMANDIBULAR JOINT SURGERY    . VARICOSE VEIN SURGERY    Bilateral salpingo-oophorectomy   FAMILY HISTORY: Family History  Problem Relation Age of Onset  . Hypertension Mother   . Cancer Mother        Uterine and Breast  . Cerebral aneurysm Mother   . Arthritis Father   . Hypertension Father   . Glaucoma Father    [name]'s father died from Alzheimer's disease at age 74.  He had a history of bladder cancer.  Patients' mother is living at the age of 47 as of August 2020.   She was diagnosed with breast cancer in her 71s and also uterine cancer. The patient has 1 sister, no brothers. Patient denies anyone in her family having ovarian, prostate, or pancreatic cancer.    GYNECOLOGIC HISTORY:  No LMP recorded. Patient has had a hysterectomy. 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 Her husband, Quita Skye, is a Engineer, maintenance (IT). Her son, Simona Huh, lives in [place], is an Optometrist, is married and has 2 children. Her daughter, Lysbeth Galas, is an Scientist, physiological that works with farmer education near [place].  She has 1 child and 1 on the way. [name] belongs to a [employer].    ADVANCED DIRECTIVES: In the absence of any documentation, [name]'s spouse, Quita Skye, is automatically her healthcare power of attorney.      HEALTH MAINTENANCE: Social History   Tobacco Use  . Smoking status: Never Smoker  . Smokeless tobacco: Never Used  Substance Use Topics  . Alcohol use: No  . Drug use: No    Colonoscopy: 2017/[facility]  PAP: Status post hysterectomy  Bone density: Due in 09/2018 Mammography: Up to date  Allergies  Allergen Reactions  . Fish Allergy Hives  .  Metoclopramide Hcl Other (See Comments)    Facial tightness   . Penicillins Hives    Did it involve swelling of the face/tongue/throat, SOB, or low BP? No Did it involve sudden or severe rash/hives, skin peeling, or any reaction on the inside of your mouth or nose? No Did you need to seek medical attention at a hospital or doctor's office? No When did it last happen? ~40 years ago If all above answers are "NO", may proceed with cephalosporin use.   . Shellfish Allergy Hives  . Sulfamethoxazole-Trimethoprim Hives  . Ciprofloxacin Palpitations  . Nickel Rash    Current Outpatient Medications  Medication Sig Dispense Refill  . acetaminophen (TYLENOL) 500 MG tablet Take 1,000 mg by mouth every 6 (six) hours as needed (pain.).    Marland Kitchen ACZONE 7.5 % GEL Apply 1 application topically at bedtime.    Marland Kitchen albuterol (PROAIR HFA) 108 (90 BASE) MCG/ACT inhaler Inhale 1-2 puffs into the lungs every 4 (four) hours as needed. (Patient taking differently: Inhale 1-2 puffs into the lungs every 4 (four) hours as needed (asthma/wheezing/shortness of breath.). ) 18 g 3  . Biotin 5000 MCG TABS Take 5,000 mcg by mouth daily.    . Cholecalciferol (VITAMIN D-3) 125 MCG (5000 UT) TABS Take 5,000 Units by mouth at bedtime.    Marland Kitchen  Cinnamon 500 MG capsule Take 500 mg by mouth every evening.     . clobetasol (TEMOVATE) 0.05 % external solution Apply 1 application topically at bedtime. Applied to scalp    . conjugated estrogens (PREMARIN) vaginal cream Place 1 Applicatorful vaginally 2 (two) times a week. Mondays & Thursdays.    . famotidine (PEPCID) 20 MG tablet Take 20 mg by mouth 2 (two) times daily.    . fexofenadine (ALLEGRA) 180 MG tablet Take 180 mg by mouth at bedtime.    . Flaxseed, Linseed, (FLAX SEED OIL) 1000 MG CAPS Take 1,000 mg by mouth at bedtime.    . fluticasone (FLONASE) 50 MCG/ACT nasal spray Place 2 sprays into both nostrils daily. (Patient not taking: Reported on 08/04/2018) 16 g 6  . hydrochlorothiazide  (HYDRODIURIL) 25 MG tablet Take 25 mg by mouth daily as needed (fluid retention.).    Marland Kitchen magnesium oxide (MAG-OX) 400 MG tablet Take 400 mg by mouth every evening.     . montelukast (SINGULAIR) 10 MG tablet take 1 tablet by mouth once daily (Patient taking differently: Take 10 mg by mouth at bedtime. ) 90 tablet 3  . polycarbophil (FIBERCON) 625 MG tablet Take 625 mg by mouth daily.    . Probiotic Product (PROBIOTIC PO) Take 1 capsule by mouth daily.    Marland Kitchen SPIRIVA HANDIHALER 18 MCG inhalation capsule inhale THE CONTENTS OF ONE CAPSULE IN THE HANDIHALER once daily (Patient taking differently: Place 18 mcg into inhaler and inhale daily. ) 30 capsule 0  . SYSTANE 0.4-0.3 % SOLN Place 1 drop into both eyes 4 (four) times daily as needed (dry/irritated eyes.).    Marland Kitchen tamoxifen (NOLVADEX) 20 MG tablet Take 1 tablet (20 mg total) by mouth daily. 90 tablet 12  . TURMERIC PO Take 1,000 mg by mouth at bedtime.    . verapamil (VERELAN PM) 180 MG 24 hr capsule Take 180 mg by mouth at bedtime.    . vitamin B-12 (CYANOCOBALAMIN) 1000 MCG tablet Take 1,000 mcg by mouth at bedtime.      Current Facility-Administered Medications  Medication Dose Route Frequency Provider Last Rate Last Dose  . ipratropium-albuterol (DUONEB) 0.5-2.5 (3) MG/3ML nebulizer solution 3 mL  3 mL Nebulization Once Kelby Aline, PA-C         OBJECTIVE: Middle aged white woman who appears well  Vitals:   11/04/18 0944  BP: 139/64  Pulse: 85  Resp: 18  Temp: 98.6 F (37 C)  SpO2: 100%     Body mass index is 25.96 kg/m.   Wt Readings from Last 3 Encounters:  11/04/18 153 lb 9.6 oz (69.7 kg)  08/04/18 157 lb 8 oz (71.4 kg)  05/31/18 156 lb 1.4 oz (70.8 kg)      ECOG FS:1 - Symptomatic but completely ambulatory  Sclerae unicteric, EOMs intact Wearing a mask No cervical or supraclavicular adenopathy Lungs no rales or rhonchi Heart regular rate and rhythm Abd soft, nontender, positive bowel sounds MSK no focal spinal  tenderness, no upper extremity lymphedema Neuro: nonfocal, well oriented, appropriate affect Breasts: I do not palpate a mass in either breast.  Both axillae are benign.    LAB RESULTS:  CMP     Component Value Date/Time   NA 135 05/22/2018 1042   K 4.0 05/22/2018 1042   CL 99 05/22/2018 1042   CO2 29 05/22/2018 1042   GLUCOSE 96 05/22/2018 1042   BUN 11 05/22/2018 1042   CREATININE 0.95 05/22/2018 1042   CREATININE 0.90 02/25/2015  1042   CALCIUM 9.4 05/22/2018 1042   PROT 7.2 05/22/2018 1042   ALBUMIN 3.8 05/22/2018 1042   AST 22 05/22/2018 1042   ALT 16 05/22/2018 1042   ALKPHOS 63 05/22/2018 1042   BILITOT 0.5 05/22/2018 1042   GFRNONAA >60 05/22/2018 1042   GFRNONAA 68 02/25/2015 1042   GFRAA >60 05/22/2018 1042   GFRAA 78 02/25/2015 1042    No results found for: TOTALPROTELP, ALBUMINELP, A1GS, A2GS, BETS, BETA2SER, GAMS, MSPIKE, SPEI  No results found for: KPAFRELGTCHN, LAMBDASER, KAPLAMBRATIO  Lab Results  Component Value Date   WBC 7.9 05/22/2018   NEUTROABS 2.6 02/25/2015   HGB 12.1 05/22/2018   HCT 37.3 05/22/2018   MCV 90.8 05/22/2018   PLT 361 05/22/2018    No results found for: LABCA2  No components found for: [id number]  No results for input(s): INR in the last 168 hours.  No results found for: LABCA2  No results found for: [id number]  No results found for: [id number]  No results found for: [id number]  No results found for: CA2729  No components found for: HGQUANT  No results found for: CEA1 / No results found for: CEA1   No results found for: AFPTUMOR  No results found for: CHROMOGRNA  No results found for: PSA1  No visits with results within 3 Day(s) from this visit.  Latest known visit with results is:  Orders Only on 10/01/2018  Component Date Value Ref Range Status  . SARS-CoV-2, NAA 10/01/2018 Not Detected  Not Detected Final   Comment: This nucleic acid amplification test was developed and its performance characteristics determined  by [employer], Dickinson and [employer]. Nucleic acid amplification tests include PCR and TMA. This test has not been FDA cleared or approved. This test has been authorized by FDA under an Emergency Use Authorization (EUA). This test is only authorized for the duration of time the declaration that circumstances exist justifying the authorization of the emergency use of in vitro diagnostic tests for detection of SARS-CoV-2 virus and/or diagnosis of COVID-19 infection under section 564(b)(1) of the Act, 21 U.S.C. [id number]) (1), unless the authorization is terminated or revoked sooner. When diagnostic testing is negative, the possibility of a false negative result should be considered in the context of a patient's recent exposures and the presence of clinical signs and symptoms consistent with COVID-19. An individual without symptoms of COVID-19 and who is not shedding SARS-CoV-2 virus would                           expect to have a negative (not detected) result in this assay.     (this displays the last labs from the last 3 days)  No results found for: TOTALPROTELP, ALBUMINELP, A1GS, A2GS, BETS, BETA2SER, GAMS, MSPIKE, SPEI (this displays SPEP labs)  No results found for: KPAFRELGTCHN, LAMBDASER, KAPLAMBRATIO (kappa/lambda light chains)  No results found for: HGBA, HGBA2QUANT, HGBFQUANT, HGBSQUAN (Hemoglobinopathy evaluation)   No results found for: LDH  Lab Results  Component Value Date   IRON 81 08/17/2014   TIBC 341 08/17/2014   IRONPCTSAT 24 08/17/2014   (Iron and TIBC)  Lab Results  Component Value Date   FERRITIN 21 08/17/2014    Urinalysis    Component Value Date/Time   COLORURINE YELLOW 09/20/2014 [place] 09/20/2014 1143   LABSPEC 1.019 09/20/2014 1143   PHURINE 6.5 09/20/2014 1143   GLUCOSEU NEGATIVE 09/20/2014 1143   HGBUR NEGATIVE 09/20/2014 1143   BILIRUBINUR negative  02/12/2015 1457   KETONESUR negative 02/12/2015 [place]  09/20/2014 1143   PROTEINUR negative 02/12/2015 1457   PROTEINUR NEGATIVE 09/20/2014 1143   UROBILINOGEN 0.2 02/12/2015 1457   UROBILINOGEN 0.2 05/11/2013 1029   NITRITE Negative 02/12/2015 1457   NITRITE NEGATIVE 09/20/2014 1143   LEUKOCYTESUR small (1+) (A) 02/12/2015 1457     STUDIES:  No results found.   ELIGIBLE FOR AVAILABLE RESEARCH PROTOCOL: no   ASSESSMENT: 68 y.o. [place], [place] woman status post left lumpectomy 05/30/2018 showing atypical lobular hyperplasia --lifetime breast cancer risk 20-25%  (1) risk reduction: Tamoxifen started 08/04/2018  (2) intensified screening:  (a) breast MRI SEPT  (b) mammography MARCH  (c) biannual breast exam   PLAN: [name] is tolerating the tamoxifen well and the plan will be to continue that a total of 5 years.  She understands that this will decrease her lifetime breast cancer risk by 50%.  The MRI was affordable, less than $200 out-of-pocket.  She does have category B breast density.  I think it would be prudent to obtain the MRI next year again and after that we will do it every other year.  She will be set up for mammography in March.  If she sees Dr. Marlou Starks in April and sees me in October she will have biannual breast exams which is also part of the protocol  She is very pleased with this plan.  She knows to call for any other issue that may develop before the next visit.  Suanne Minahan, Virgie Dad, MD  11/04/18 10:08 AM Medical Oncology and Hematology [facility] [place], [place] [address] Tel. [phone]    Fax. [phone]    I, Wilburn Mylar, am acting as scribe for Dr. Virgie Dad. Dorean Hiebert.  I, Lurline Del MD, have reviewed the above documentation for accuracy and completeness, and I agree with the above.

## 2018-11-04 ENCOUNTER — Other Ambulatory Visit: Payer: Self-pay | Admitting: Oncology

## 2018-11-04 ENCOUNTER — Inpatient Hospital Stay: Payer: Medicare Other | Attending: Oncology | Admitting: Oncology

## 2018-11-04 ENCOUNTER — Telehealth: Payer: Self-pay | Admitting: Oncology

## 2018-11-04 ENCOUNTER — Other Ambulatory Visit: Payer: Self-pay

## 2018-11-04 VITALS — BP 139/64 | HR 85 | Temp 98.6°F | Resp 18 | Ht 64.5 in | Wt 153.6 lb

## 2018-11-04 DIAGNOSIS — Z9189 Other specified personal risk factors, not elsewhere classified: Secondary | ICD-10-CM

## 2018-11-04 DIAGNOSIS — J45909 Unspecified asthma, uncomplicated: Secondary | ICD-10-CM | POA: Diagnosis not present

## 2018-11-04 DIAGNOSIS — Z8049 Family history of malignant neoplasm of other genital organs: Secondary | ICD-10-CM | POA: Insufficient documentation

## 2018-11-04 DIAGNOSIS — N6092 Unspecified benign mammary dysplasia of left breast: Secondary | ICD-10-CM | POA: Insufficient documentation

## 2018-11-04 DIAGNOSIS — Z8249 Family history of ischemic heart disease and other diseases of the circulatory system: Secondary | ICD-10-CM | POA: Diagnosis not present

## 2018-11-04 DIAGNOSIS — Z79810 Long term (current) use of selective estrogen receptor modulators (SERMs): Secondary | ICD-10-CM | POA: Diagnosis not present

## 2018-11-04 DIAGNOSIS — Z803 Family history of malignant neoplasm of breast: Secondary | ICD-10-CM | POA: Diagnosis not present

## 2018-11-04 DIAGNOSIS — Z79899 Other long term (current) drug therapy: Secondary | ICD-10-CM | POA: Diagnosis not present

## 2018-11-04 MED ORDER — TAMOXIFEN CITRATE 20 MG PO TABS: 20.0000 mg | ORAL_TABLET | Freq: Every day | ORAL | 12 refills | Status: AC

## 2018-11-04 NOTE — Telephone Encounter (Signed)
I could not reach patient regarding schedule  °

## 2018-11-24 ENCOUNTER — Other Ambulatory Visit: Payer: Self-pay

## 2018-11-24 DIAGNOSIS — Z20822 Contact with and (suspected) exposure to covid-19: Secondary | ICD-10-CM

## 2018-11-26 LAB — NOVEL CORONAVIRUS, NAA: SARS-CoV-2, NAA: NOT DETECTED

## 2018-12-12 ENCOUNTER — Other Ambulatory Visit: Payer: Self-pay

## 2018-12-12 DIAGNOSIS — Z20822 Contact with and (suspected) exposure to covid-19: Secondary | ICD-10-CM

## 2018-12-14 LAB — NOVEL CORONAVIRUS, NAA: SARS-CoV-2, NAA: NOT DETECTED

## 2019-01-07 ENCOUNTER — Other Ambulatory Visit: Payer: Self-pay | Admitting: Internal Medicine

## 2019-01-14 ENCOUNTER — Other Ambulatory Visit: Payer: Self-pay | Admitting: Internal Medicine

## 2019-01-14 DIAGNOSIS — Z8249 Family history of ischemic heart disease and other diseases of the circulatory system: Secondary | ICD-10-CM

## 2019-01-14 DIAGNOSIS — G4489 Other headache syndrome: Secondary | ICD-10-CM

## 2019-01-29 ENCOUNTER — Ambulatory Visit: Payer: Medicare Other

## 2019-02-07 ENCOUNTER — Ambulatory Visit: Payer: Medicare PPO | Attending: Internal Medicine

## 2019-02-07 DIAGNOSIS — Z23 Encounter for immunization: Secondary | ICD-10-CM | POA: Insufficient documentation

## 2019-02-07 NOTE — Progress Notes (Signed)
   Covid-19 Vaccination Clinic  Name:  [name]    MRN: [id number] DOB: 07/19/1950  02/07/2019  [name] was observed post Covid-19 immunization for 30 minutes based on pre-vaccination screening without incidence. She was provided with Vaccine Information Sheet and instruction to access the V-Safe system.   [name] was instructed to call 911 with any severe reactions post vaccine: Marland Kitchen Difficulty breathing  . Swelling of your face and throat  . A fast heartbeat  . A bad rash all over your body  . Dizziness and weakness    Immunizations Administered    Name Date Dose VIS Date Route   Pfizer COVID-19 Vaccine 02/07/2019  8:19 AM 0.3 mL 12/12/2018 Intramuscular   Manufacturer: [place]   Lot: [id number]   [place]: [id number]

## 2019-02-09 ENCOUNTER — Ambulatory Visit: Payer: Medicare Other

## 2019-02-10 ENCOUNTER — Ambulatory Visit
Admission: RE | Admit: 2019-02-10 | Discharge: 2019-02-10 | Disposition: A | Discharge status: Home / Self Care | Payer: Medicare PPO | Source: Ambulatory Visit | Attending: Internal Medicine | Admitting: Internal Medicine

## 2019-02-10 ENCOUNTER — Other Ambulatory Visit: Payer: Self-pay

## 2019-02-10 DIAGNOSIS — G4489 Other headache syndrome: Secondary | ICD-10-CM

## 2019-02-10 DIAGNOSIS — Z8249 Family history of ischemic heart disease and other diseases of the circulatory system: Secondary | ICD-10-CM

## 2019-02-10 IMAGING — MR MR HEAD W/O CM
8 series · 37 of 48 positions shown · non-contrast
Comparison: Brain MRI and MRA report [DATE]

CLINICAL DATA: Family history of cerebral aneurysm. Frequent
headaches.

EXAM:
MRI HEAD WITHOUT CONTRAST
MRA HEAD WITHOUT CONTRAST
TECHNIQUE: Multiplanar, multiecho pulse sequences of the brain and surrounding
structures were obtained without intravenous contrast. Angiographic
images of the head were obtained using MRA technique without
contrast.

[Series 2: T1 · sagittal · 5.0mm · 0.45mm/px · 2 of 21 slices shown]
[im 1/21]
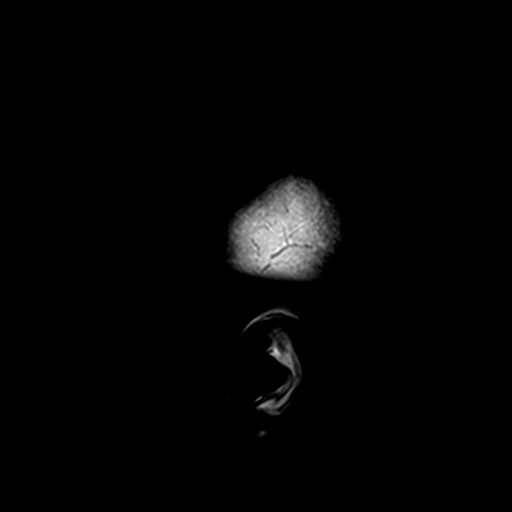
[im 21/21]
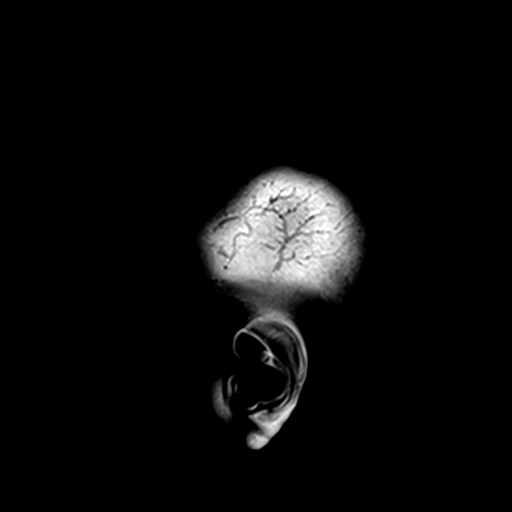

[Series 4: tof_3d_multi-slab · axial · 0.7mm · 0.35mm/px · z∈[-54,+54]mm · 8 of 156 slices shown]
[im 1/156]
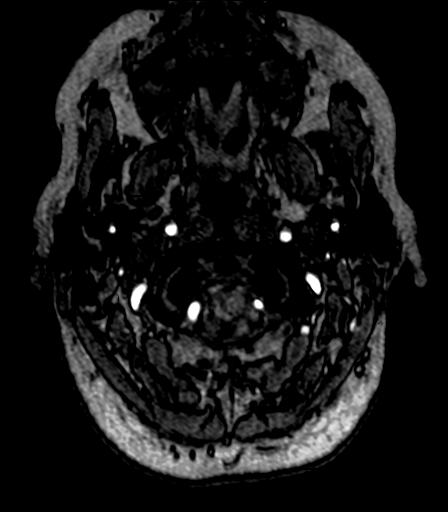
[im 24/156]
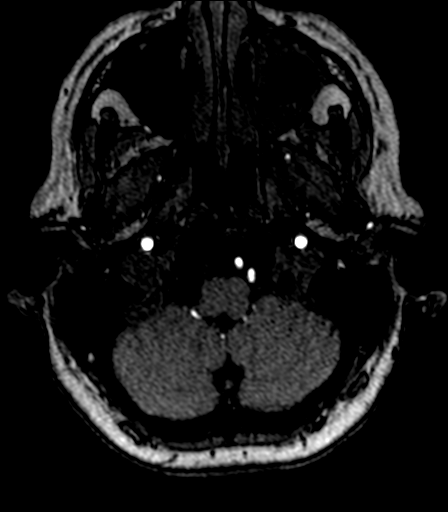
[im 48/156]
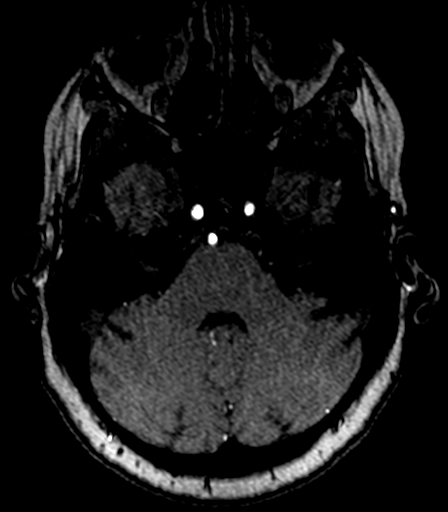
[im 72/156]
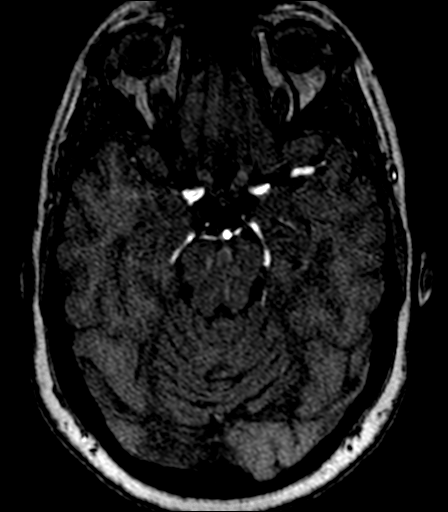
[im 84/156]
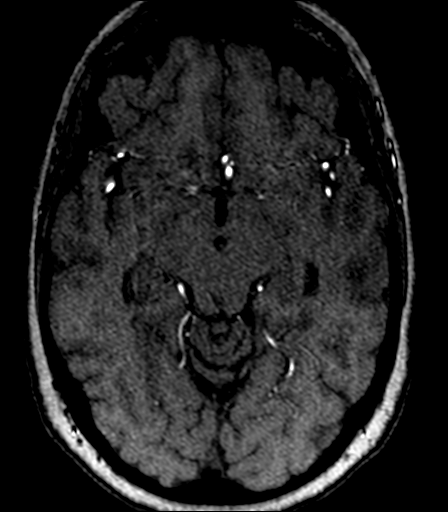
[im 108/156]
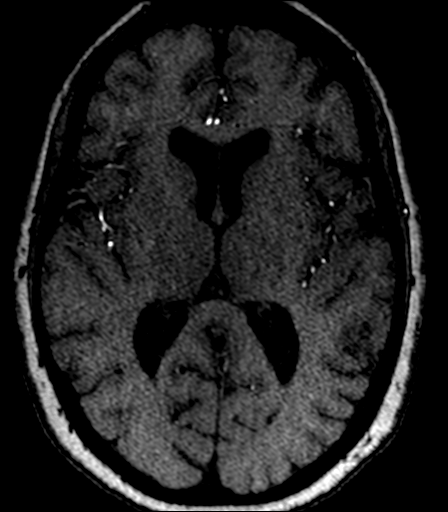
[im 132/156]
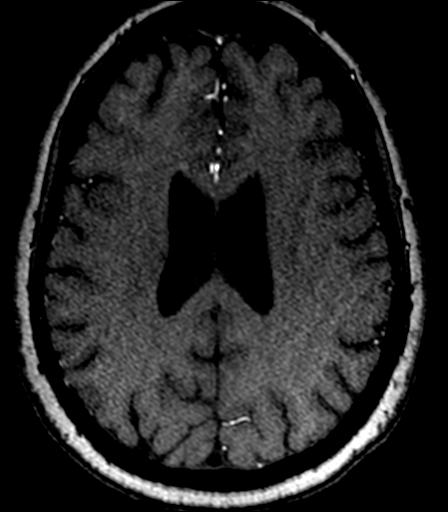
[im 156/156]
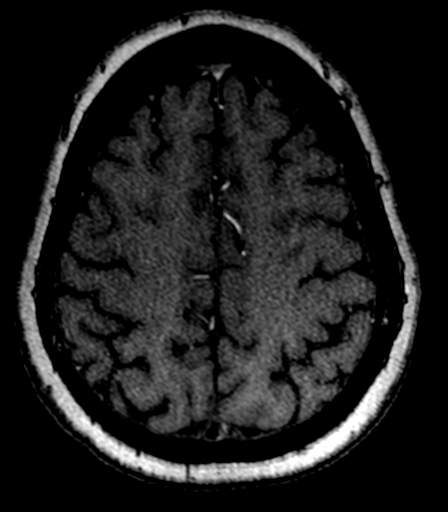

[Series 8: DWI · axial · 3.0mm · 1.80mm/px · z∈[-67,+80]mm · 9 of 100 slices shown]
[im 1/100]
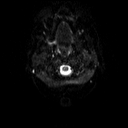
[im 13/100]
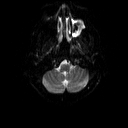
[im 25/100]
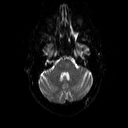
[im 38/100]
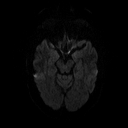
[im 50/100]
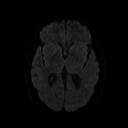
[im 62/100]
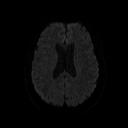
[im 75/100]
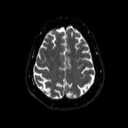
[im 87/100]
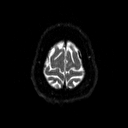
[im 100/100]
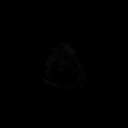

[Series 12: T2 · axial · 5.0mm · 0.51mm/px · z∈[-63,+84]mm · 2 of 22 slices shown (1 of 2)]
[im 1/22]
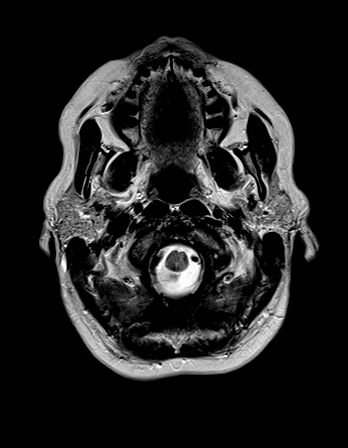
[im 22/22]
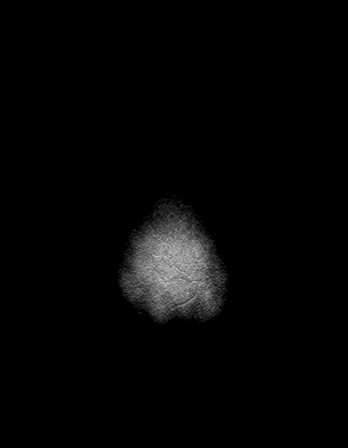

[Series 16: FLAIR · axial · 3.0mm · 0.45mm/px · z∈[-63,+81]mm · 3 of 32 slices shown]
[im 1/32]
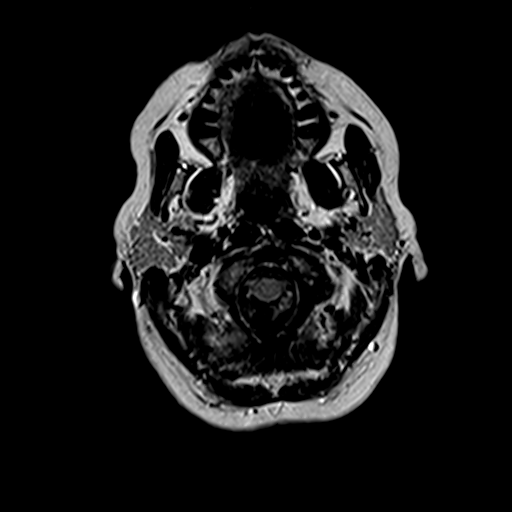
[im 16/32]
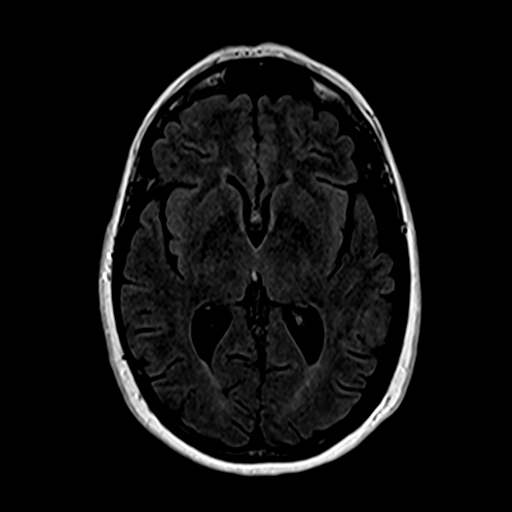
[im 32/32]
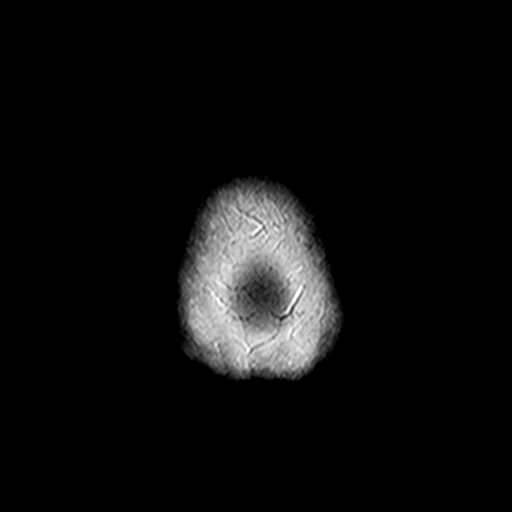

[Series 20: swi_images · axial · 4.0mm · 0.90mm/px · z∈[-69,+70]mm · 3 of 36 slices shown]
[im 1/36]
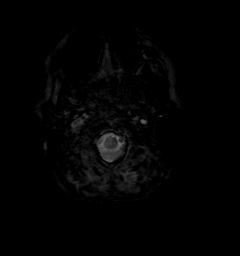
[im 18/36]
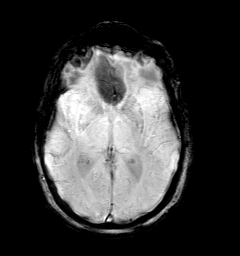
[im 36/36]
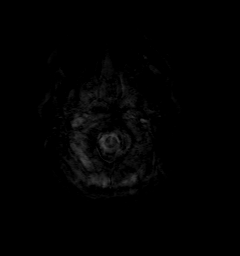

[Series 21: t1_mpr_tra · axial · 1.0mm · 0.71mm/px · z∈[-66,+77]mm · 8 of 144 slices shown]
[im 1/144]
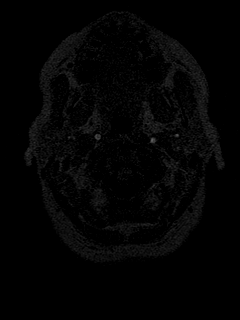
[im 24/144]
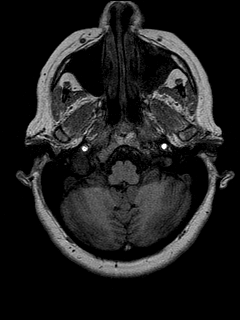
[im 48/144]
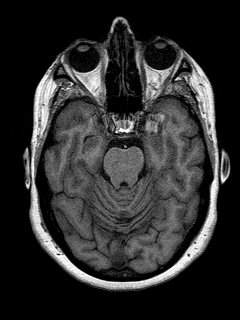
[im 60/144]
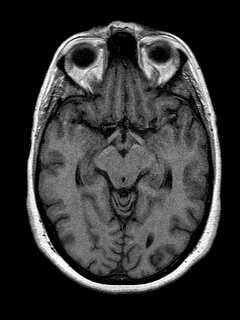
[im 84/144]
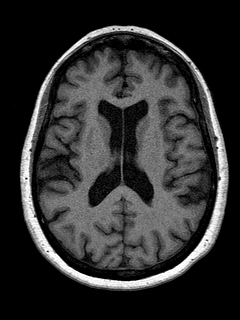
[im 96/144]
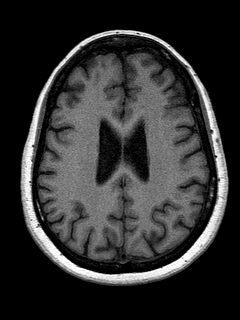
[im 120/144]
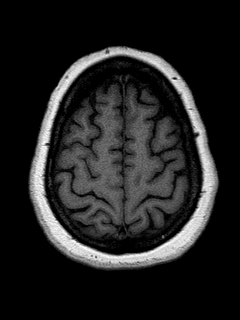
[im 144/144]
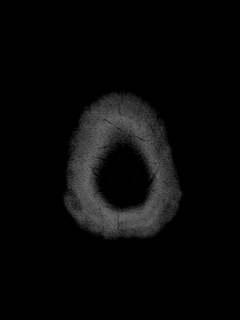

[Series 23: T2 · coronal · 5.0mm · 0.45mm/px · 2 of 25 slices shown (2 of 2)]
[im 1/25]
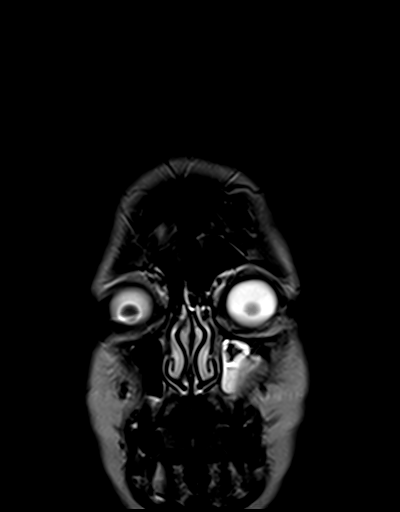
[im 25/25]
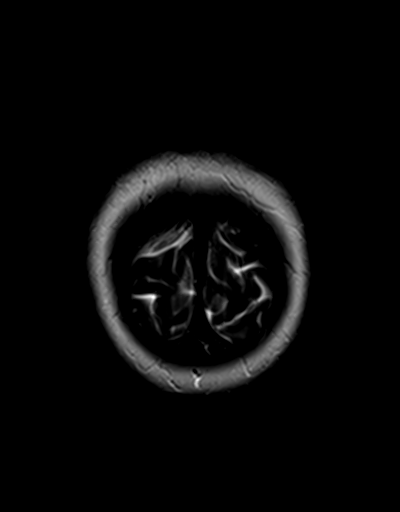

[37 of 48 positions shown; findings below may reference images not displayed]

FINDINGS: MRI HEAD FINDINGS

Brain: No acute infarction, hemorrhage, hydrocephalus, extra-axial
collection or mass lesion. No generalized white matter disease or
atrophy.

Vascular: Normal flow voids

Skull and upper cervical spine: Normal marrow signal

Sinuses/Orbits: Mucosal thickening mainly in the left maxillary
sinus-moderate. No fluid levels. Negative orbits.

MRA HEAD FINDINGS

Unremarkable intracranial branching pattern with intact
circle-of-Willis. No stenosis, beading, or aneurysm.
IMPRESSION: 1. Negative brain MRI and MRA. No evidence of aneurysm or
intracranial cause for headache.
2. Chronic sinusitis, especially of the left maxillary.

## 2019-03-04 ENCOUNTER — Ambulatory Visit: Payer: Medicare PPO | Attending: Internal Medicine

## 2019-03-04 DIAGNOSIS — Z23 Encounter for immunization: Secondary | ICD-10-CM

## 2019-03-04 NOTE — Progress Notes (Signed)
   Covid-19 Vaccination Clinic  Name:  [name]    MRN: [id number] DOB: 22-Jan-1950  03/04/2019  [name] was observed post Covid-19 immunization for 30 minutes based on pre-vaccination screening without incident. She was provided with Vaccine Information Sheet and instruction to access the V-Safe system.   [name] was instructed to call 911 with any severe reactions post vaccine: Marland Kitchen Difficulty breathing  . Swelling of face and throat  . A fast heartbeat  . A bad rash all over body  . Dizziness and weakness   Immunizations Administered    Name Date Dose VIS Date Route   Pfizer COVID-19 Vaccine 03/04/2019  8:29 AM 0.3 mL 12/12/2018 Intramuscular   Manufacturer: [place]   Lot: [id number]   [place]: [id number]

## 2019-03-09 DIAGNOSIS — J320 Chronic maxillary sinusitis: Secondary | ICD-10-CM | POA: Diagnosis not present

## 2019-03-13 ENCOUNTER — Ambulatory Visit: Payer: Medicare Other

## 2019-03-30 ENCOUNTER — Other Ambulatory Visit: Payer: Self-pay

## 2019-03-30 ENCOUNTER — Ambulatory Visit: Payer: Medicare PPO | Admitting: Dermatology

## 2019-03-30 DIAGNOSIS — D229 Melanocytic nevi, unspecified: Secondary | ICD-10-CM

## 2019-03-30 DIAGNOSIS — L814 Other melanin hyperpigmentation: Secondary | ICD-10-CM | POA: Diagnosis not present

## 2019-03-30 DIAGNOSIS — I781 Nevus, non-neoplastic: Secondary | ICD-10-CM | POA: Diagnosis not present

## 2019-03-30 DIAGNOSIS — L821 Other seborrheic keratosis: Secondary | ICD-10-CM

## 2019-03-30 DIAGNOSIS — Z1283 Encounter for screening for malignant neoplasm of skin: Secondary | ICD-10-CM | POA: Diagnosis not present

## 2019-03-31 NOTE — Progress Notes (Signed)
   Day-Up Visit   Subjective  [name] is a 69 y.o. female who presents for the following: Annual Exam (Check spots on patients face and 2 spots on patient breast.).  New spots Location: Face and chest Duration: Several months Quality: Stable Associated Signs/Symptoms: Modifying Factors:  Severity:  Timing: Context:   The following portions of the chart were reviewed this encounter and updated as appropriate:     Objective  Well appearing patient in no apparent distress; mood and affect are within normal limits.  A focused examination was performed including face, upper chest, back, arms, legs. Relevant physical exam findings are noted in the Assessment and Plan. Examination showed no atypical moles, melanoma, or nonmelanoma skin cancer.  The lesion on the right mid cheek was 2 mm with very subtle pigmentation; dermoscopy showed a lentigo.  There were telangiectasia on the chin and left cheek.  Clinically typical seborrheic keratoses on the chest.  I did discuss with [name] treatment options for each of these; mutually, we elected to leave them be for now.  Routine general skin examination in 1 year.  Assessment & Plan

## 2019-04-01 DIAGNOSIS — I8312 Varicose veins of left lower extremity with inflammation: Secondary | ICD-10-CM | POA: Diagnosis not present

## 2019-04-15 ENCOUNTER — Ambulatory Visit
Admission: RE | Admit: 2019-04-15 | Discharge: 2019-04-15 | Disposition: A | Discharge status: Home / Self Care | Payer: Medicare PPO | Source: Ambulatory Visit | Attending: Oncology | Admitting: Oncology

## 2019-04-15 ENCOUNTER — Other Ambulatory Visit: Payer: Self-pay

## 2019-04-15 DIAGNOSIS — N6092 Unspecified benign mammary dysplasia of left breast: Secondary | ICD-10-CM

## 2019-04-15 DIAGNOSIS — Z9189 Other specified personal risk factors, not elsewhere classified: Secondary | ICD-10-CM

## 2019-04-15 DIAGNOSIS — Z1231 Encounter for screening mammogram for malignant neoplasm of breast: Secondary | ICD-10-CM | POA: Diagnosis not present

## 2019-04-15 IMAGING — MG DIGITAL SCREENING BILAT W/ TOMO W/ CAD
6 of 10 series · 6 of 30 positions shown · non-contrast
Comparison: Previous exam(s).

CLINICAL DATA: Screening.

EXAM:
DIGITAL SCREENING BILATERAL MAMMOGRAM WITH TOMO AND CAD

[L CC synth-2D]
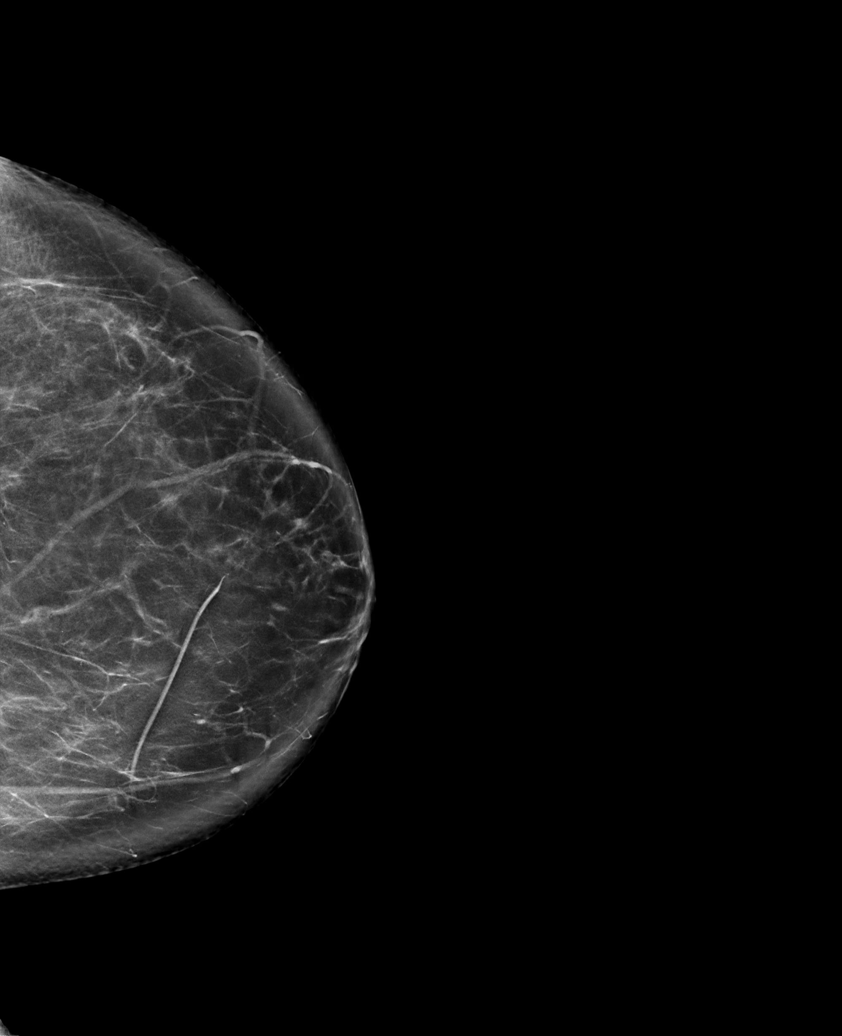

[L MLO synth-2D (1 of 2)]
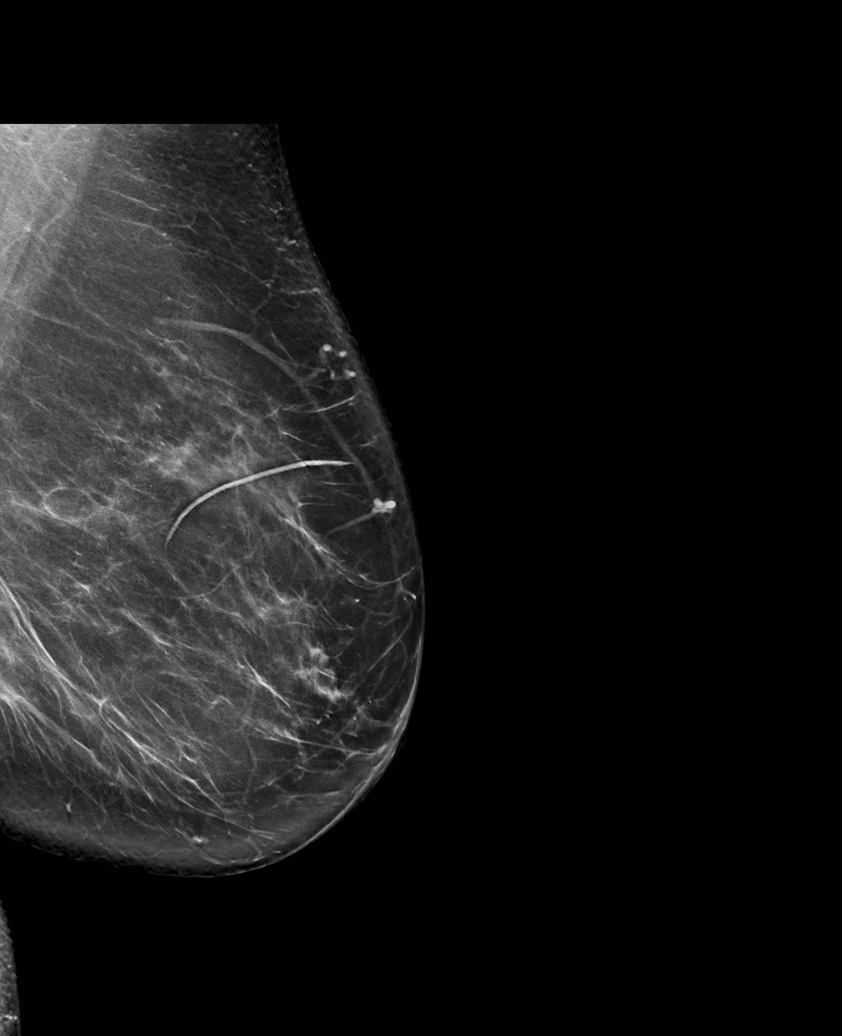

[L MLO synth-2D (2 of 2)]
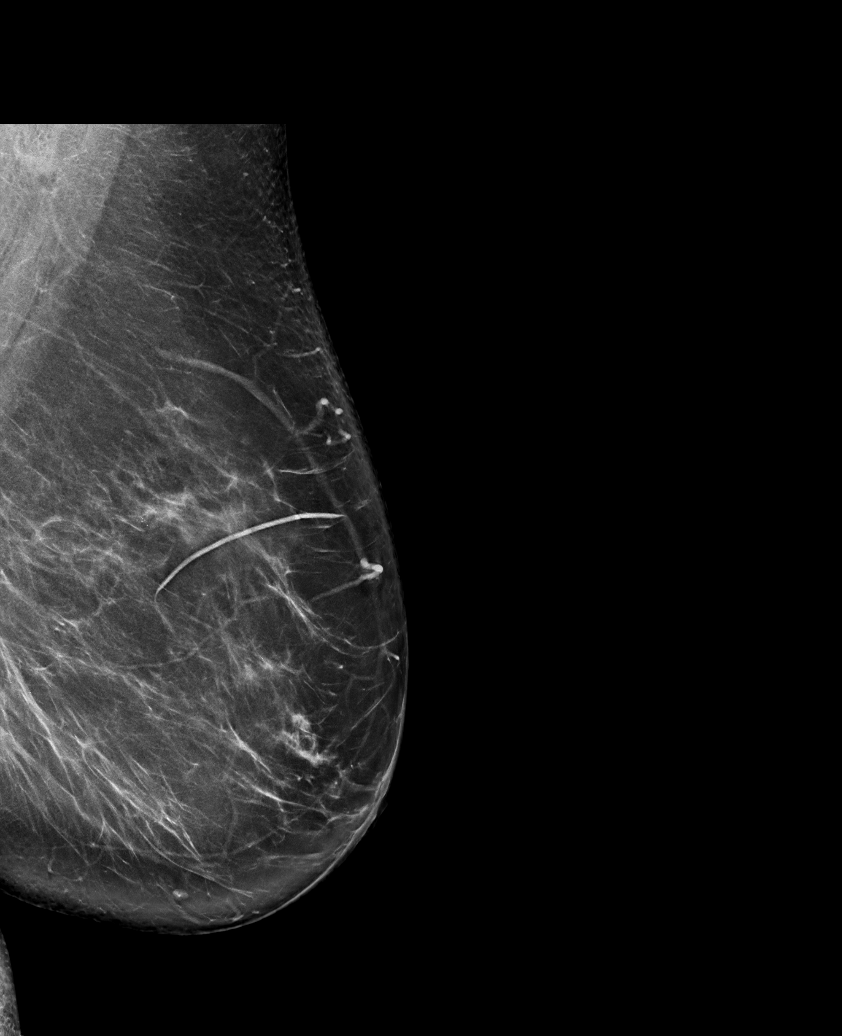

[R MLO synth-2D]
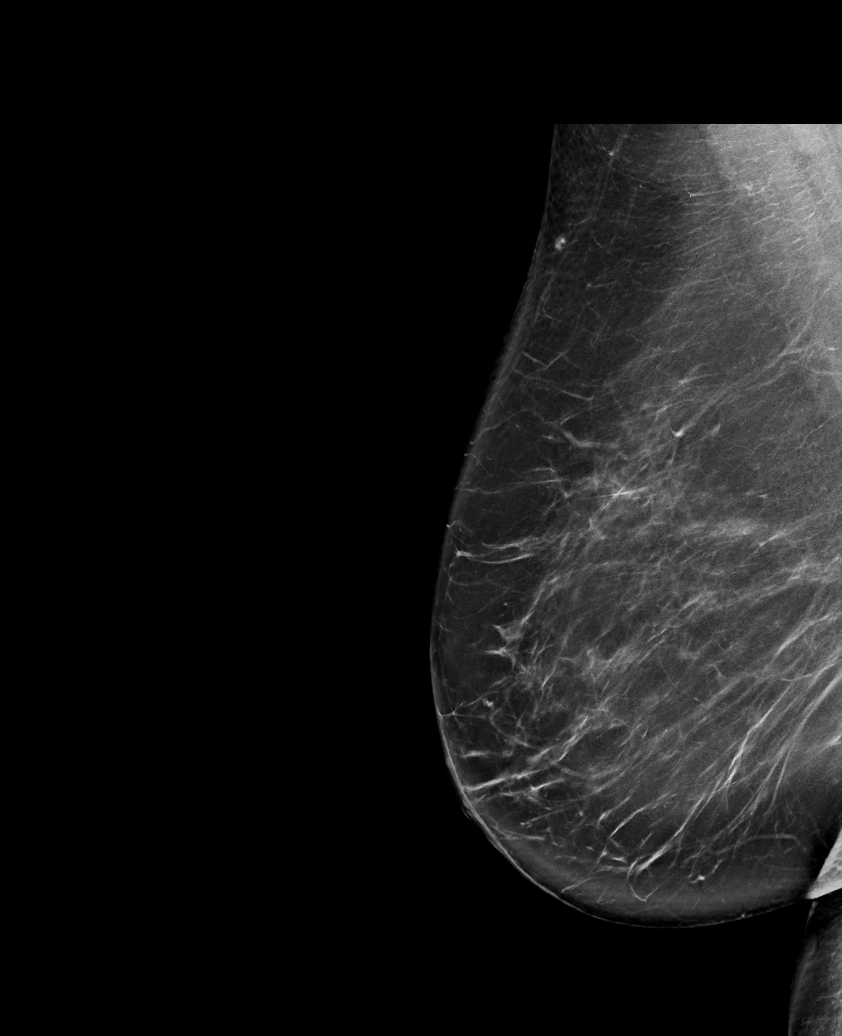

[R CC synth-2D]
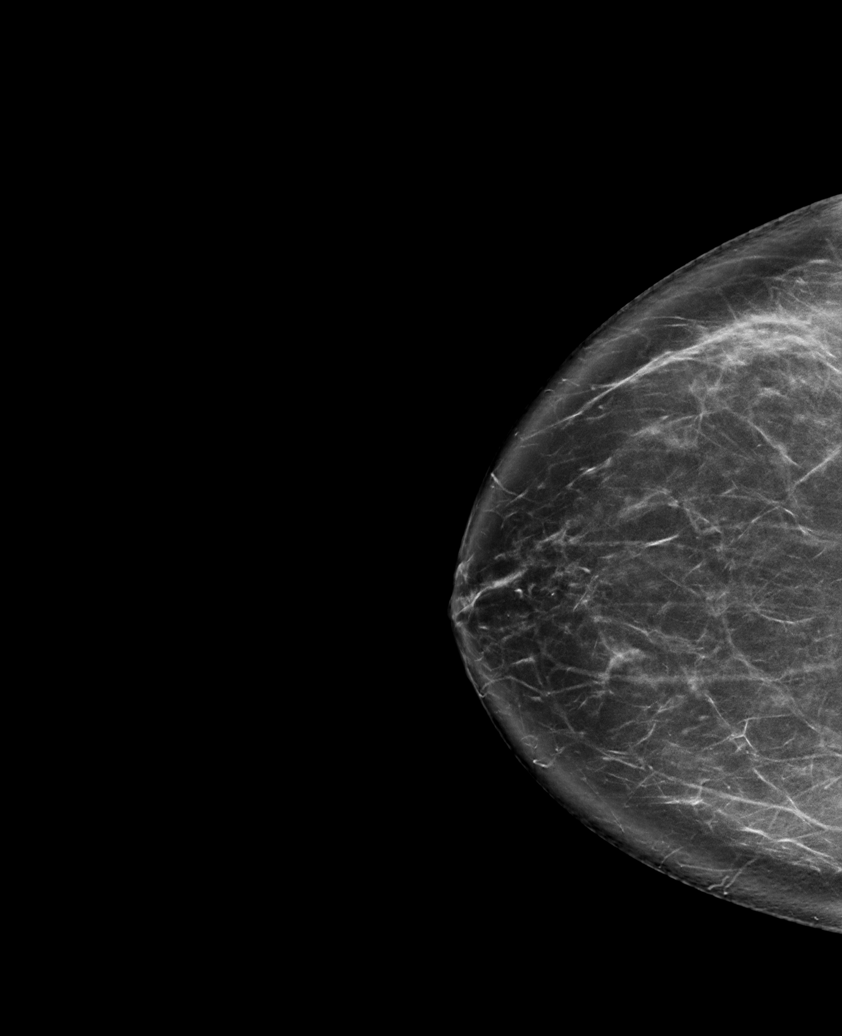

[R MLO tomo · tomo slice 47/92.0]
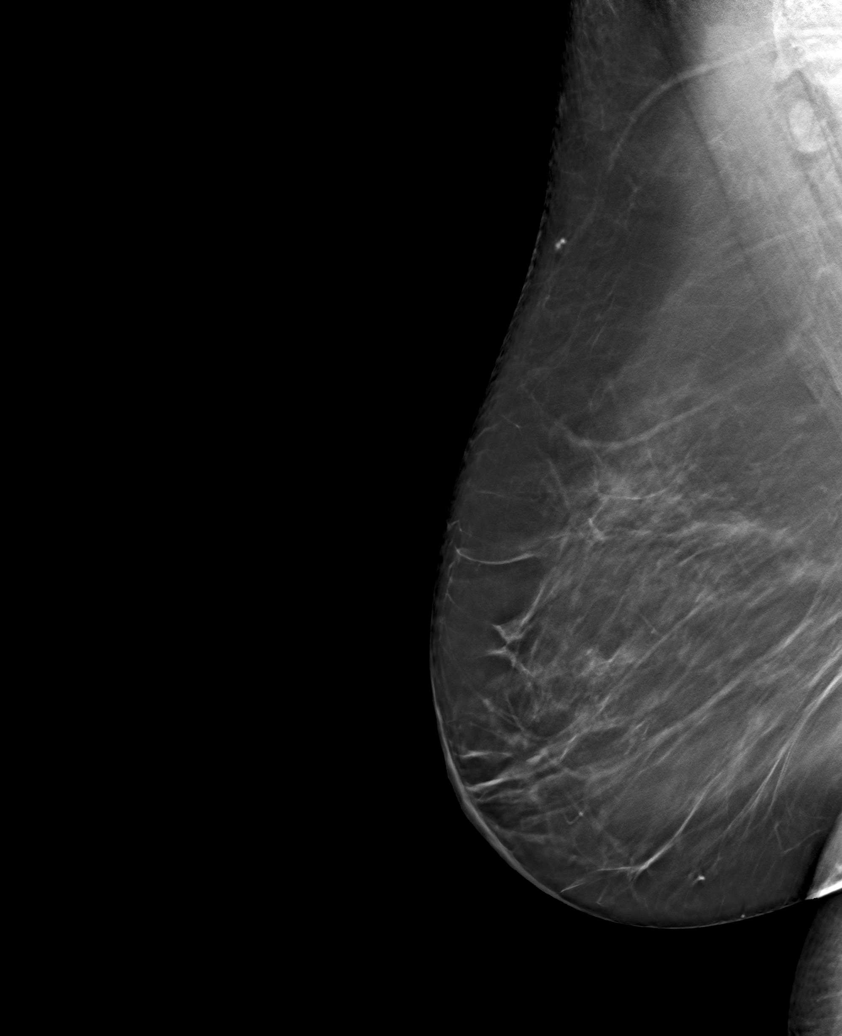

[6 of 30 positions shown; findings below may reference images not displayed]

ACR Breast Density Category b: There are scattered areas of
fibroglandular density.
FINDINGS: There are no findings suspicious for malignancy. Images were
processed with CAD.
IMPRESSION: No mammographic evidence of malignancy. A result letter of this
screening mammogram will be mailed directly to the patient.

RECOMMENDATION:
Screening mammogram in one year. (Code:[TQ])

BI-RADS CATEGORY  1: Negative.

## 2019-05-04 DIAGNOSIS — I8312 Varicose veins of left lower extremity with inflammation: Secondary | ICD-10-CM | POA: Diagnosis not present

## 2019-05-13 DIAGNOSIS — I8312 Varicose veins of left lower extremity with inflammation: Secondary | ICD-10-CM | POA: Diagnosis not present

## 2019-05-13 DIAGNOSIS — M79605 Pain in left leg: Secondary | ICD-10-CM | POA: Diagnosis not present

## 2019-06-24 ENCOUNTER — Other Ambulatory Visit: Payer: Self-pay | Admitting: Dermatology

## 2019-09-14 DIAGNOSIS — Z124 Encounter for screening for malignant neoplasm of cervix: Secondary | ICD-10-CM | POA: Diagnosis not present

## 2019-09-14 DIAGNOSIS — Z6826 Body mass index (BMI) 26.0-26.9, adult: Secondary | ICD-10-CM | POA: Diagnosis not present

## 2019-09-17 ENCOUNTER — Other Ambulatory Visit: Payer: Self-pay | Admitting: Oncology

## 2019-09-24 ENCOUNTER — Ambulatory Visit
Admission: RE | Admit: 2019-09-24 | Discharge: 2019-09-24 | Disposition: A | Discharge status: Home / Self Care | Payer: Medicare PPO | Source: Ambulatory Visit | Attending: Oncology | Admitting: Oncology

## 2019-09-24 ENCOUNTER — Other Ambulatory Visit: Payer: Self-pay

## 2019-09-24 DIAGNOSIS — N6002 Solitary cyst of left breast: Secondary | ICD-10-CM | POA: Diagnosis not present

## 2019-09-24 DIAGNOSIS — Z9189 Other specified personal risk factors, not elsewhere classified: Secondary | ICD-10-CM

## 2019-09-24 DIAGNOSIS — N6082 Other benign mammary dysplasias of left breast: Secondary | ICD-10-CM | POA: Diagnosis not present

## 2019-09-24 DIAGNOSIS — N641 Fat necrosis of breast: Secondary | ICD-10-CM | POA: Diagnosis not present

## 2019-09-24 DIAGNOSIS — N6092 Unspecified benign mammary dysplasia of left breast: Secondary | ICD-10-CM

## 2019-09-24 DIAGNOSIS — Z803 Family history of malignant neoplasm of breast: Secondary | ICD-10-CM | POA: Diagnosis not present

## 2019-09-24 IMAGING — MR MR BREAST BILAT WO/W CM
8 of 12 series · 33 of 48 positions shown · IV contrast (7ML GADAVIST)
Comparison: Bilateral screening mammogram dated [DATE] and
bilateral breast MRI dated [DATE].

CLINICAL DATA: Status post surgical excision of flat epithelial
atypia in the 11 o'clock position of the left breast on a [DATE]
with final pathology demonstrating flat epithelial atypia and
atypical lobular hyperplasia without malignancy. The patient is
asymptomatic today. Family history of breast cancer in her mother
diagnosed at age 73.

LABS:  None obtained on site today.
EXAM:
BILATERAL BREAST MRI WITH AND WITHOUT CONTRAST
TECHNIQUE: Multiplanar, multisequence MR images of both breasts were obtained
prior to and following the intravenous administration of 7 ml of
Gadavist

[Series 2: t2_tirm_tra ipat (a-p) · axial · 3.0mm · 0.70mm/px · 1 of 55 slices shown]
[im 1/55]
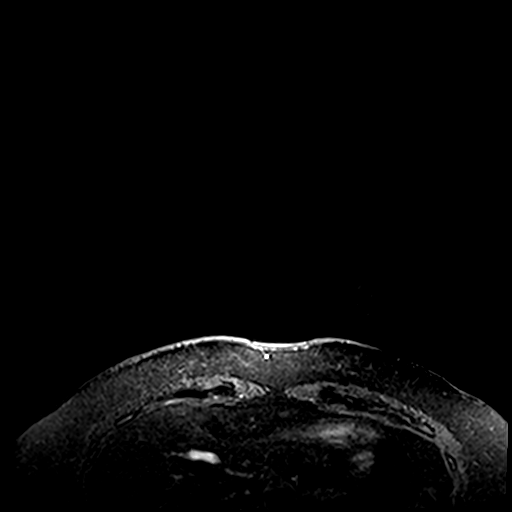

[Series 3: fl3d pre-cm no · axial · non-contrast · 1.2mm · 0.94mm/px · z∈[-106,+65]mm · 5 of 144 slices shown]
[im 1/144]
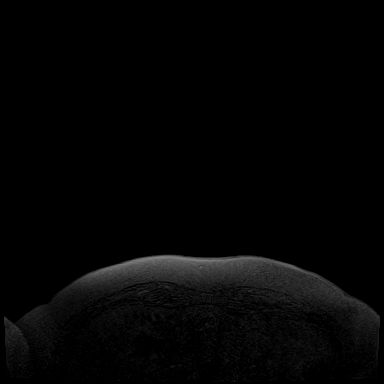
[im 36/144]
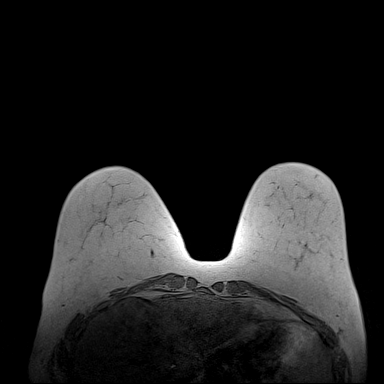
[im 72/144]
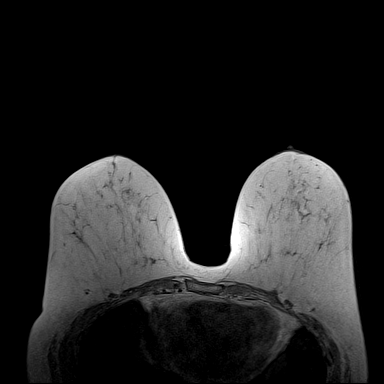
[im 108/144]
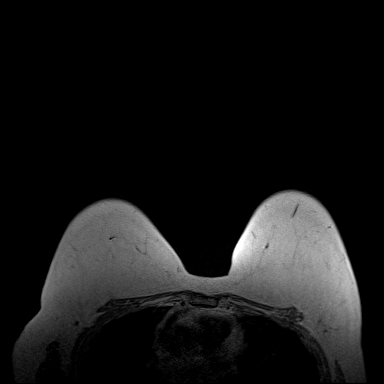
[im 144/144]
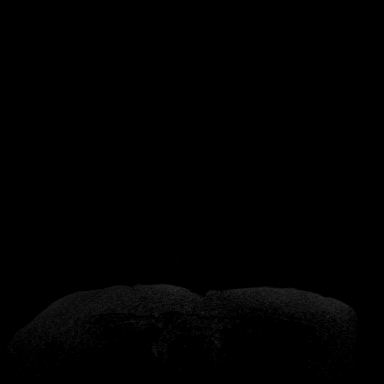

[Series 4: fl3d pre-cm · axial · non-contrast · 1.2mm · 0.94mm/px · z∈[-106,+65]mm · 5 of 144 slices shown]
[im 1/144]
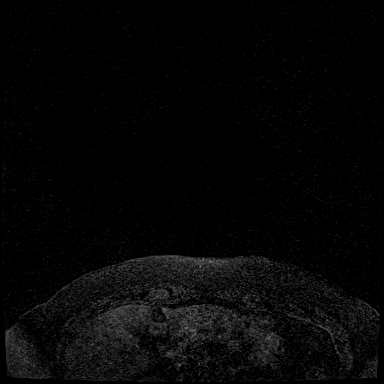
[im 36/144]
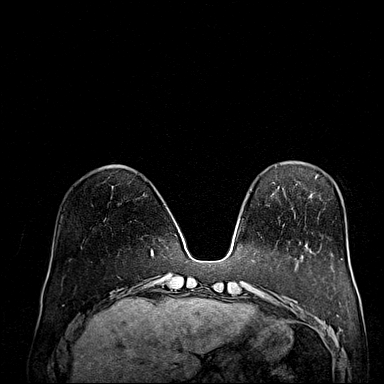
[im 72/144]
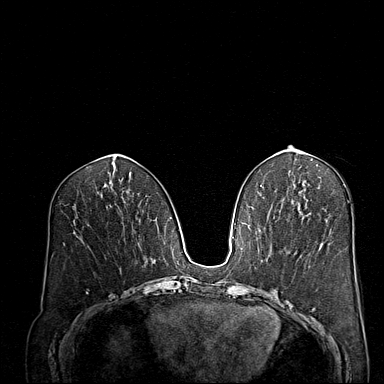
[im 108/144]
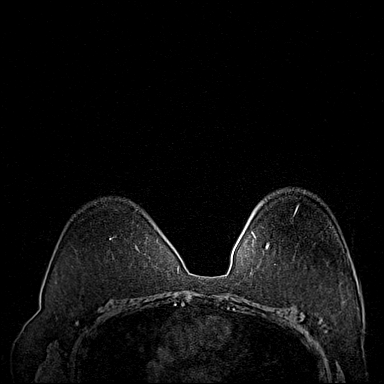
[im 144/144]
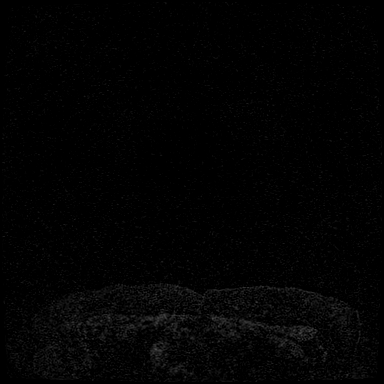

[Series 5: fl3d post immediate · axial · 1.2mm · 0.94mm/px · z∈[-106,+65]mm · 5 of 144 slices shown (1 of 3)]
[im 1/144]
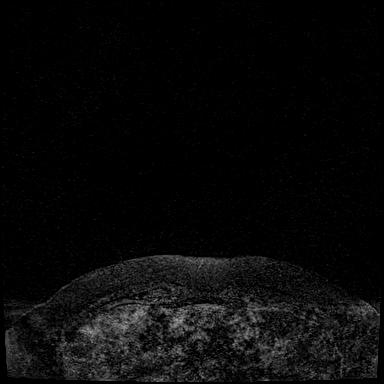
[im 36/144]
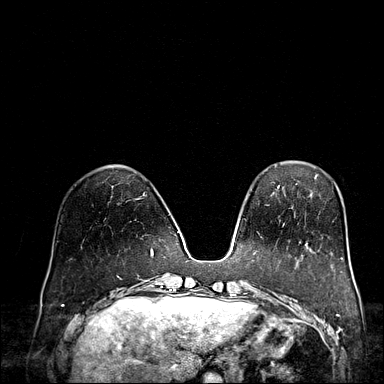
[im 72/144]
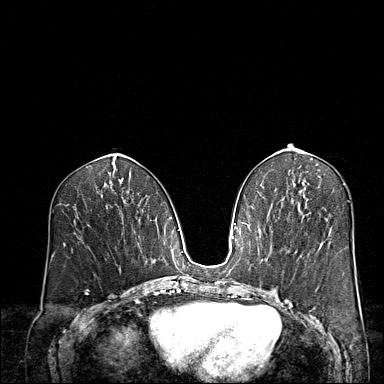
[im 108/144]
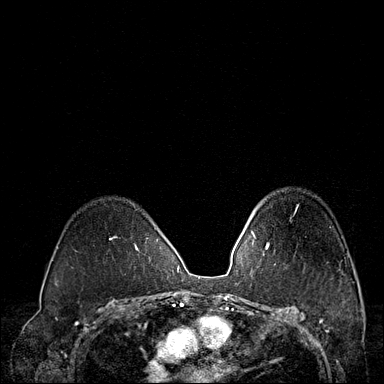
[im 144/144]
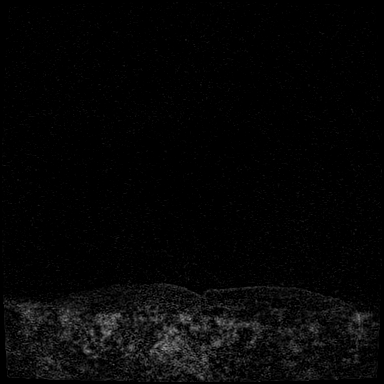

[Series 6: fl3d post immediate · axial · 1.2mm · 0.94mm/px · z∈[-106,+65]mm · 5 of 144 slices shown (2 of 3)]
[im 1/144]
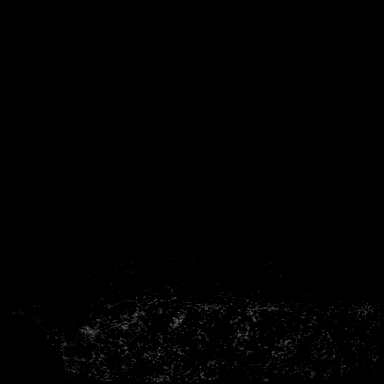
[im 36/144]
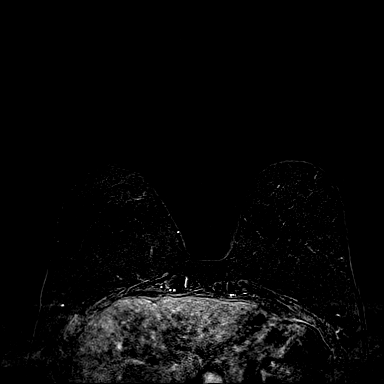
[im 72/144]
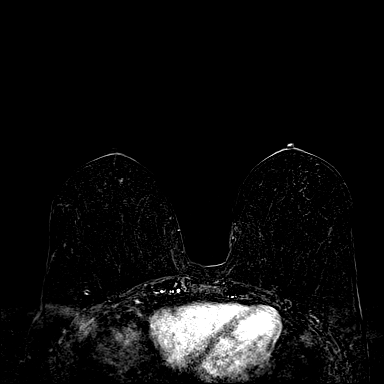
[im 108/144]
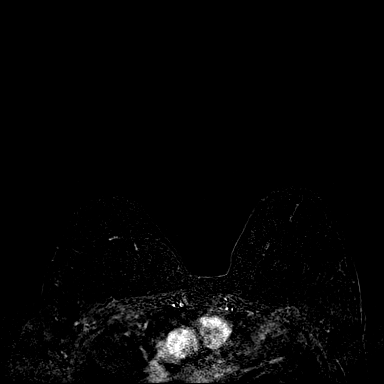
[im 144/144]
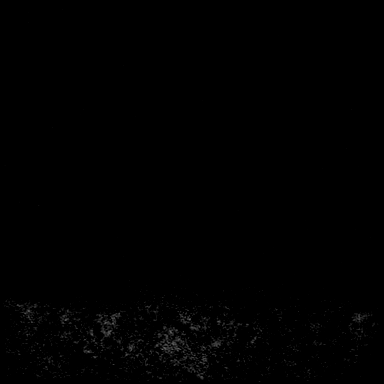

[Series 7: fl3d post immediate · axial · 172.8mm · 0.94mm/px · 1 of 1 slices shown (3 of 3)]
[im 1/1]
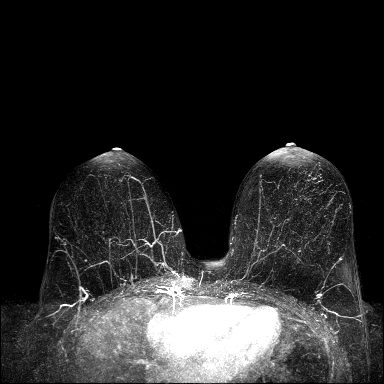

[Series 8: fl3d post 3min · axial · 1.2mm · 0.94mm/px · z∈[-106,+65]mm · 6 of 144 slices shown]
[im 1/144]
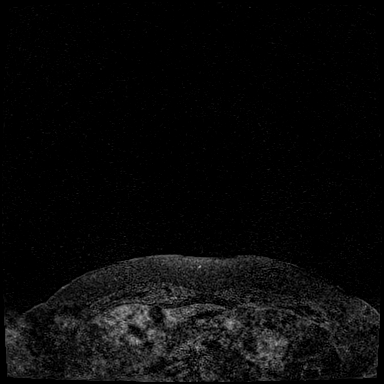
[im 29/144]
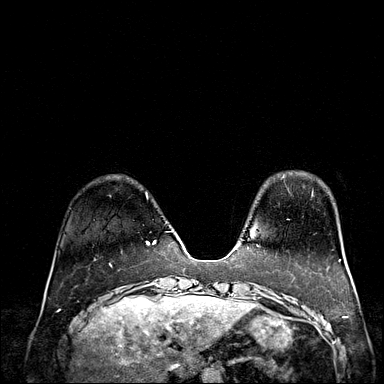
[im 58/144]
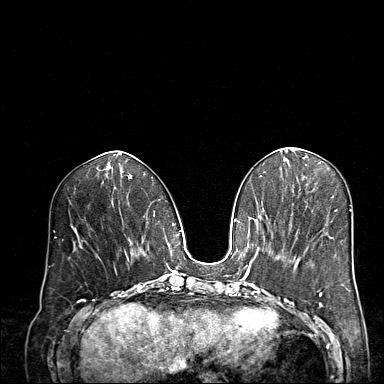
[im 86/144]
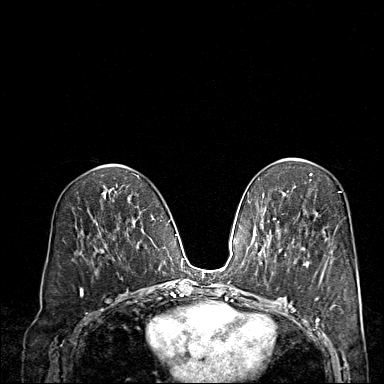
[im 115/144]
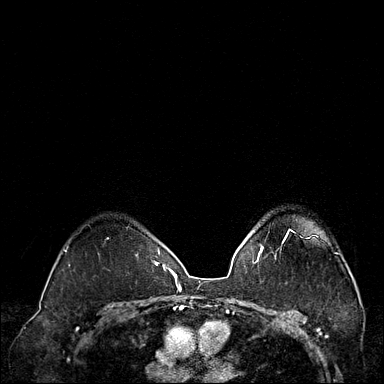
[im 144/144]
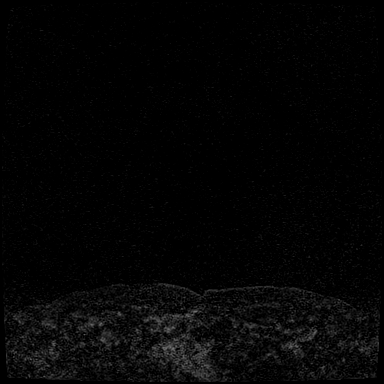

[Series 9: fl3d post 3min_sub · axial · 1.2mm · 0.94mm/px · z∈[-106,+31]mm · 5 of 144 slices shown]
[im 1/144]
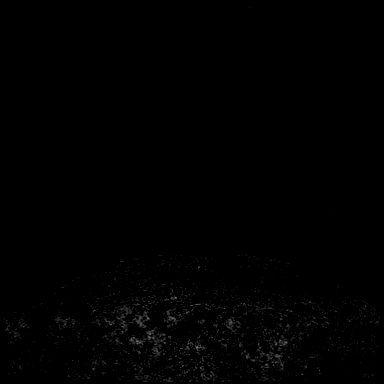
[im 29/144]
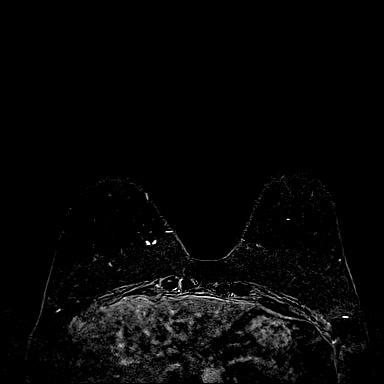
[im 58/144]
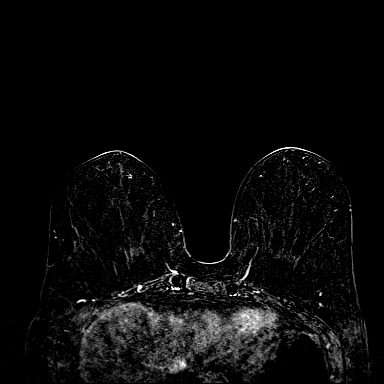
[im 86/144]
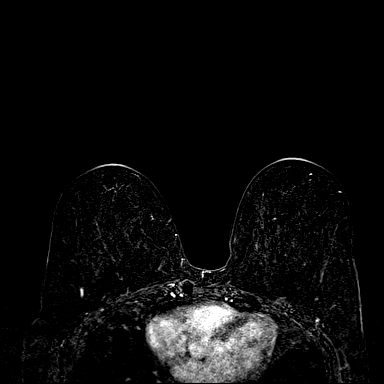
[im 115/144]
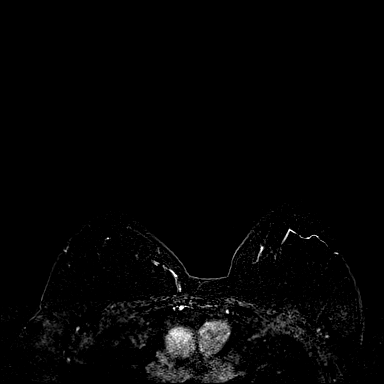

[33 of 48 positions shown; findings below may reference images not displayed]

Three-dimensional MR images were rendered by post-processing of the
original MR data on an independent workstation. The
three-dimensional MR images were interpreted, and findings are
reported in the following complete MRI report for this study. Three
dimensional images were evaluated at the independent interpreting
workstation using the DynaCAD thin client.
FINDINGS: Breast composition: b. Scattered fibroglandular tissue.

Background parenchymal enhancement: Mild.

Right breast: No mass or abnormal enhancement.

Left breast: Small oval area of postsurgical fat necrosis with oil
cyst formation in the posterior aspect of the upper inner quadrant
of the left breast. No mass or enhancement suspicious for malignancy

Lymph nodes: No abnormal appearing lymph nodes.

Ancillary findings:  None.
IMPRESSION: No evidence of malignancy.

RECOMMENDATION:
Bilateral screening mammogram in 7 months when due.

BI-RADS CATEGORY  2: Benign.

## 2019-09-24 MED ORDER — GADOBUTROL 1 MMOL/ML IV SOLN
7.0000 mL | Freq: Once | INTRAVENOUS | Status: AC | PRN
  Administered 2019-09-24: 7 mL via INTRAVENOUS

## 2019-09-25 DIAGNOSIS — Z1212 Encounter for screening for malignant neoplasm of rectum: Secondary | ICD-10-CM | POA: Diagnosis not present

## 2019-10-05 NOTE — Progress Notes (Signed)
[facility]  [phone] [phone]    ID: JAI BEAR DOB: 1950/04/04  MR#: [id number]  VOZ#:[id number]  Patient Care Team: Crist Infante, MD as PCP - General (Internal Medicine) Richmond Campbell, MD as Consulting Physician (Gastroenterology) Almedia Balls, MD as Consulting Physician (Orthopedic Surgery) Lavonna Monarch, MD as Consulting Physician (Dermatology) Maisie Fus, MD as Consulting Physician (Obstetrics and Gynecology) Jovita Kussmaul, MD as Consulting Physician (General Surgery) OTHER MD:  CHIEF COMPLAINT: Breast cancer high risk (atypical lobular hyperplasia).  CURRENT TREATMENT: tamoxifen; intensified screening   INTERVAL HISTORY: [name] returns today for follow up of her high risk for breast cancer.  She continues on tamoxifen.  She does not have classic hot flashes although she does feel a little hotter than normal sometimes.  She does not sweat.  She has had minimal vaginal wetness.  She has no other side effects from tamoxifen  She is under intensified screening and had a breast MRI 09/24/2019 showing breast density category B.  There was no suspicious finding.   REVIEW OF SYSTEMS: [name] walks for exercise just about every night, usually 30 to 40 minutes.  She has 4 grandchildren she is very busy with and of course she takes care of her 46 year old grandmother who lives at friends homes as well.  She has had the Covid vaccine x2+ a booster plus the flu shot.  A detailed review of systems today was otherwise stable.   HISTORY OF CURRENT ILLNESS:  From the original intake note:  "[name]" had routine screening mammography at Dr. Verlon [facility] clinic showing a possible assymetry in the left breast. She underwent unilateral left diagnostic mammography with tomography and left breast ultrasonography at [facility] on 03/11/2018 showing: Breast Density Category B. Spot compression tomosynthesis images through the region of concern in the  superior left breast demonstrates a persistent asymmetry measuring approximately 0.9 cm. There is no associated distortion or suspicious calcifications. Ultrasound of the left breast at 11 o'clock, 7 cm from the nipple demonstrates an irregular hypoechoic mass measuring 0.6 x 0.4 x 0.5 cm. Ultrasound of the left axilla demonstrates multiple normal-appearing lymph nodes.  Accordingly on 03/12/2018 she proceeded to biopsy of the left breast area in question. The pathology from this procedure showed ([id number]): Flat epithelial atypia with background columnar cell hyperplasia, 11 o'clock.  This was felt to be discordant.  She underwent a left lumpectomy and a laparoscopic cholecystectomy on 05/30/2018. The pathology from this procedure showed ([id number]): 1. Breast, lumpectomy, left with radioactive seed - Biopsy site changes. Columnar cell hyperplasia. Flat epithelial atypia. Lobular neoplasia (atypical lobular hyperplasia) 2. Gallbladder   - Chronic cholecystitis with cholelithiasis  The patient's subsequent history is as detailed below.   PAST MEDICAL HISTORY: Past Medical History:  Diagnosis Date  . Allergy   . Anemia   . Asthma   . Depression   . GERD (gastroesophageal reflux disease)   . Headache   . Palpitations 2016   Cardiolite neg 2005, echo 2011 EF 55%  Gastroparesis possibly secondary to remote pyloric stenosis correction   PAST SURGICAL HISTORY: Past Surgical History:  Procedure Laterality Date  . ABDOMINAL HYSTERECTOMY    . BREAST BIOPSY Left   . BREAST LUMPECTOMY Left 05/2018   abnormal cells  . BREAST LUMPECTOMY WITH RADIOACTIVE SEED LOCALIZATION Left 05/30/2018   Procedure: LEFT BREAST LUMPECTOMY WITH RADIOACTIVE SEED LOCALIZATION;  Surgeon: Jovita Kussmaul, MD;  Location: [facility];  Service: General;  Laterality: Left;  . BUNIONECTOMY    .  CESAREAN SECTION    . CHOLECYSTECTOMY N/A 05/30/2018   Procedure: LAPAROSCOPIC CHOLECYSTECTOMY WITH INTRAOPERATIVE CHOLANGIOGRAM;   Surgeon: Jovita Kussmaul, MD;  Location: [facility];  Service: General;  Laterality: N/A;  . pyloric stenosis     at 29 weeks old  . TEMPOROMANDIBULAR JOINT SURGERY    . VARICOSE VEIN SURGERY    Bilateral salpingo-oophorectomy   FAMILY HISTORY: Family History  Problem Relation Age of Onset  . Hypertension Mother   . Cancer Mother        Uterine and Breast  . Cerebral aneurysm Mother   . Breast cancer Mother   . Arthritis Father   . Hypertension Father   . Glaucoma Father    [name]'s father died from Alzheimer's disease at age 58.  He had a history of bladder cancer.  Patients' mother is living at the age of 71 as of August 2020.   She was diagnosed with breast cancer in her 86s and also uterine cancer. The patient has 1 sister, no brothers. Patient denies anyone in her family having ovarian, prostate, or pancreatic cancer.    GYNECOLOGIC HISTORY:  No LMP recorded. Patient has had a hysterectomy. 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 Her husband, Quita Skye, is a Engineer, maintenance (IT). Her son, Simona Huh, lives in [place], is an Optometrist, is married and has 2 children. Her daughter, Lysbeth Galas, is an Scientist, physiological that works with farmer education near [place].  She has 1 child and 1 on the way. [name] belongs to a [employer].    ADVANCED DIRECTIVES: In the absence of any documentation, [name]'s spouse, Quita Skye, is automatically her healthcare power of attorney.      HEALTH MAINTENANCE: Social History   Tobacco Use  . Smoking status: Never Smoker  . Smokeless tobacco: Never Used  Vaping Use  . Vaping Use: Never used  Substance Use Topics  . Alcohol use: No  . Drug use: No    Colonoscopy: 2017/[facility]  PAP: Status post hysterectomy  Bone density: Due in 09/2018 Mammography: Up to date  Allergies  Allergen Reactions  .  Fish Allergy Hives  . Metoclopramide Hcl Other (See Comments)    Facial tightness   . Penicillins Hives    Did it involve swelling of the face/tongue/throat, SOB, or low BP? No Did it involve sudden or severe rash/hives, skin peeling, or any reaction on the inside of your mouth or nose? No Did you need to seek medical attention at a hospital or doctor's office? No When did it last happen? ~40 years ago If all above answers are "NO", may proceed with cephalosporin use.   . Shellfish Allergy Hives  . Sulfamethoxazole-Trimethoprim Hives  . Ciprofloxacin Palpitations  . Nickel Rash    Current Outpatient Medications  Medication Sig Dispense Refill  . acetaminophen (TYLENOL) 500 MG tablet Take 1,000 mg by mouth every 6 (six) hours as needed (pain.).    Marland Kitchen ACZONE 7.5 % GEL Apply 1 application topically at bedtime.    Marland Kitchen albuterol (PROAIR HFA) 108 (90 BASE) MCG/ACT inhaler Inhale 1-2 puffs into the lungs every 4 (four) hours as needed. (Patient taking differently: Inhale 1-2 puffs into the lungs every 4 (four) hours as needed (asthma/wheezing/shortness of breath.). ) 18 g 3  . Biotin 5000 MCG TABS Take 5,000 mcg by mouth daily.    Marland Kitchen  Cholecalciferol (VITAMIN D-3) 125 MCG (5000 UT) TABS Take 5,000 Units by mouth at bedtime.    . Cinnamon 500 MG capsule Take 500 mg by mouth every evening.     . clobetasol (TEMOVATE) 0.05 % external solution APPLY TO THE AFFECTED AREA EVERY NIGHT AT BEDTIME 50 mL 4  . conjugated estrogens (PREMARIN) vaginal cream Place 1 Applicatorful vaginally 2 (two) times a week. Mondays & Thursdays.    . famotidine (PEPCID) 20 MG tablet Take 20 mg by mouth 2 (two) times daily.    . fexofenadine (ALLEGRA) 180 MG tablet Take 180 mg by mouth at bedtime.    . Flaxseed, Linseed, (FLAX SEED OIL) 1000 MG CAPS Take 1,000 mg by mouth at bedtime.    . fluticasone (FLONASE) 50 MCG/ACT nasal spray Place 2 sprays into both nostrils daily. 16 g 6  . hydrochlorothiazide (HYDRODIURIL) 25 MG  tablet Take 25 mg by mouth daily as needed (fluid retention.).    Marland Kitchen magnesium oxide (MAG-OX) 400 MG tablet Take 400 mg by mouth every evening.     . montelukast (SINGULAIR) 10 MG tablet take 1 tablet by mouth once daily (Patient taking differently: Take 10 mg by mouth at bedtime. ) 90 tablet 3  . nystatin-triamcinolone ointment (MYCOLOG) nystatin-triamcinolone 100,000 unit/gram-0.1 % topical ointment    . polycarbophil (FIBERCON) 625 MG tablet Take 625 mg by mouth daily.    . Probiotic Product (PROBIOTIC PO) Take 1 capsule by mouth daily.    Marland Kitchen SPIRIVA HANDIHALER 18 MCG inhalation capsule inhale THE CONTENTS OF ONE CAPSULE IN THE HANDIHALER once daily (Patient taking differently: Place 18 mcg into inhaler and inhale daily. ) 30 capsule 0  . SYSTANE 0.4-0.3 % SOLN Place 1 drop into both eyes 4 (four) times daily as needed (dry/irritated eyes.).    Marland Kitchen tamoxifen (NOLVADEX) 20 MG tablet TAKE 1 TABLET(20 MG) BY MOUTH DAILY 90 tablet 0  . TURMERIC PO Take 1,000 mg by mouth at bedtime.    . verapamil (VERELAN PM) 180 MG 24 hr capsule Take 180 mg by mouth at bedtime.    . vitamin B-12 (CYANOCOBALAMIN) 1000 MCG tablet Take 1,000 mcg by mouth at bedtime.      Current Facility-Administered Medications  Medication Dose Route Frequency Provider Last Rate Last Admin  . ipratropium-albuterol (DUONEB) 0.5-2.5 (3) MG/3ML nebulizer solution 3 mL  3 mL Nebulization Once Kelby Aline, PA-C         OBJECTIVE: White woman in no acute distress  Vitals:   10/06/19 0957  BP: (!) 113/57  Pulse: 93  Resp: 18  Temp: (!) 97 F (36.1 C)  SpO2: 99%     Body mass index is 27.21 kg/m.   Wt Readings from Last 3 Encounters:  10/06/19 161 lb (73 kg)  11/04/18 153 lb 9.6 oz (69.7 kg)  08/04/18 157 lb 8 oz (71.4 kg)      ECOG FS:1 - Symptomatic but completely ambulatory  Sclerae unicteric, EOMs intact Wearing a mask No cervical or supraclavicular adenopathy Lungs no rales or rhonchi Heart regular rate and  rhythm Abd soft, nontender, positive bowel sounds MSK no focal spinal tenderness, no upper extremity lymphedema Neuro: nonfocal, well oriented, appropriate affect Breasts: I do not palpate a mass in either breast.  There are no skin or nipple changes of concern.  Both axillae are benign.   LAB RESULTS:  CMP     Component Value Date/Time   NA 135 05/22/2018 1042   K 4.0 05/22/2018 1042  CL 99 05/22/2018 1042   CO2 29 05/22/2018 1042   GLUCOSE 96 05/22/2018 1042   BUN 11 05/22/2018 1042   CREATININE 0.95 05/22/2018 1042   CREATININE 0.90 02/25/2015 1042   CALCIUM 9.4 05/22/2018 1042   PROT 7.2 05/22/2018 1042   ALBUMIN 3.8 05/22/2018 1042   AST 22 05/22/2018 1042   ALT 16 05/22/2018 1042   ALKPHOS 63 05/22/2018 1042   BILITOT 0.5 05/22/2018 1042   GFRNONAA >60 05/22/2018 1042   GFRNONAA 68 02/25/2015 1042   GFRAA >60 05/22/2018 1042   GFRAA 78 02/25/2015 1042    No results found for: TOTALPROTELP, ALBUMINELP, A1GS, A2GS, BETS, BETA2SER, GAMS, MSPIKE, SPEI  No results found for: KPAFRELGTCHN, LAMBDASER, KAPLAMBRATIO  Lab Results  Component Value Date   WBC 7.9 05/22/2018   NEUTROABS 2.6 02/25/2015   HGB 12.1 05/22/2018   HCT 37.3 05/22/2018   MCV 90.8 05/22/2018   PLT 361 05/22/2018    No results found for: LABCA2  No components found for: [id number]  No results for input(s): INR in the last 168 hours.  No results found for: LABCA2  No results found for: WUJ811  No results found for: BJY782  No results found for: NFA213  No results found for: CA2729  No components found for: HGQUANT  No results found for: CEA1 / No results found for: CEA1   No results found for: AFPTUMOR  No results found for: CHROMOGRNA  No results found for: PSA1  No visits with results within 3 Day(s) from this visit.  Latest known visit with results is:  Orders Only on 12/12/2018  Component Date Value Ref Range Status  . SARS-CoV-2, NAA 12/12/2018 Not Detected  Not  Detected Final   Comment: This nucleic acid amplification test was developed and its performance characteristics determined by [employer], [employer] and [employer]. Nucleic acid amplification tests include PCR and TMA. This test has not been FDA cleared or approved. This test has been authorized by FDA under an Emergency Use Authorization (EUA). This test is only authorized for the duration of time the declaration that circumstances exist justifying the authorization of the emergency use of in vitro diagnostic tests for detection of SARS-CoV-2 virus and/or diagnosis of COVID-19 infection under section 564(b)(1) of the Act, 21 U.S.C. 086VHQ-4(O) (1), unless the authorization is terminated or revoked sooner. When diagnostic testing is negative, the possibility of a false negative result should be considered in the context of a patient's recent exposures and the presence of clinical signs and symptoms consistent with COVID-19. An individual without symptoms of COVID-19 and who is not shedding SARS-CoV-2 virus would                           expect to have a negative (not detected) result in this assay.     (this displays the last labs from the last 3 days)  No results found for: TOTALPROTELP, ALBUMINELP, A1GS, A2GS, BETS, BETA2SER, GAMS, MSPIKE, SPEI (this displays SPEP labs)  No results found for: KPAFRELGTCHN, LAMBDASER, KAPLAMBRATIO (kappa/lambda light chains)  No results found for: HGBA, HGBA2QUANT, HGBFQUANT, HGBSQUAN (Hemoglobinopathy evaluation)   No results found for: LDH  Lab Results  Component Value Date   IRON 81 08/17/2014   TIBC 341 08/17/2014   IRONPCTSAT 24 08/17/2014   (Iron and TIBC)  Lab Results  Component Value Date   FERRITIN 21 08/17/2014    Urinalysis    Component Value Date/Time   COLORURINE YELLOW 09/20/2014 1143  APPEARANCEUR CLEAR 09/20/2014 1143   LABSPEC 1.019 09/20/2014 1143   PHURINE 6.5 09/20/2014 1143   GLUCOSEU NEGATIVE 09/20/2014 1143   HGBUR  NEGATIVE 09/20/2014 1143   BILIRUBINUR negative 02/12/2015 1457   KETONESUR negative 02/12/2015 [place] 09/20/2014 1143   PROTEINUR negative 02/12/2015 1457   PROTEINUR NEGATIVE 09/20/2014 1143   UROBILINOGEN 0.2 02/12/2015 1457   UROBILINOGEN 0.2 05/11/2013 1029   NITRITE Negative 02/12/2015 1457   NITRITE NEGATIVE 09/20/2014 1143   LEUKOCYTESUR small (1+) (A) 02/12/2015 1457     STUDIES:  MR BREAST BILATERAL W WO CONTRAST INC CAD  Result Date: 09/24/2019 CLINICAL DATA:  Status post surgical excision of flat epithelial atypia in the 11 o'clock position of the left breast on a 05/30/2018 with final pathology demonstrating flat epithelial atypia and atypical lobular hyperplasia without malignancy. The patient is asymptomatic today. Family history of breast cancer in her mother diagnosed at age 67. LABS:  None obtained on site today. EXAM: BILATERAL BREAST MRI WITH AND WITHOUT CONTRAST TECHNIQUE: Multiplanar, multisequence MR images of both breasts were obtained prior to and following the intravenous administration of 7 ml of Gadavist Three-dimensional MR images were rendered by post-processing of the original MR data on an independent workstation. The three-dimensional MR images were interpreted, and findings are reported in the following complete MRI report for this study. Three dimensional images were evaluated at the independent interpreting workstation using the DynaCAD thin client. COMPARISON:  Bilateral screening mammogram dated 04/15/2019 and bilateral breast MRI dated 09/12/2018. FINDINGS: Breast composition: b. Scattered fibroglandular tissue. Background parenchymal enhancement: Mild. Right breast: No mass or abnormal enhancement. Left breast: Small oval area of postsurgical fat necrosis with oil cyst formation in the posterior aspect of the upper inner quadrant of the left breast. No mass or enhancement suspicious for malignancy Lymph nodes: No abnormal appearing lymph  nodes. Ancillary findings:  None. IMPRESSION: No evidence of malignancy. RECOMMENDATION: Bilateral screening mammogram in 7 months when due. BI-RADS CATEGORY  2: Benign. Electronically Signed   By: Claudie Revering M.D.   On: 09/24/2019 16:01     ELIGIBLE FOR AVAILABLE RESEARCH PROTOCOL: no   ASSESSMENT: 69 y.o. [place], [place] woman status post left lumpectomy 05/30/2018 showing atypical lobular hyperplasia --lifetime breast cancer risk 20-25%  (1) risk reduction: Tamoxifen started 08/04/2018  (2) intensified screening:  (a) breast MRI yearly  (b) mammography 6 months apart from breast MRI  (c) biannual breast exam   PLAN: [name] is now a little over a year into her 5 years of tamoxifen, with excellent tolerance.  The plan is to continue that for a total of 5 years.  Her breast MRI shows a density B.  This is very favorable.  We will continue with mammography in March and breast MRI in September 1 more year and then consider going to mammography alone  She has an excellent exercise program and I commended that  She will see me again in a year.  She knows to call for any issue that may develop before then  Total encounter time 25 minutes.*  Aubrianne Molyneux, Virgie Dad, MD  10/06/19 10:23 AM Medical Oncology and Hematology [facility] [place], [place] [address] Tel. [phone]    Fax. [phone]    I, Wilburn Mylar, am acting as scribe for Dr. Virgie Dad. [place] Mokry.  I, Lurline Del MD, have reviewed the above documentation for accuracy and completeness, and I agree with the above.   *Total Encounter Time as  defined by the Centers for Medicare and Medicaid Services includes, in addition to the face-to-face time of a patient visit (documented in the note above) non-face-to-face time: obtaining and reviewing outside history, ordering and reviewing medications, tests or procedures, care coordination (communications with other health care professionals or  caregivers) and documentation in the medical record.

## 2019-10-06 ENCOUNTER — Other Ambulatory Visit: Payer: Self-pay

## 2019-10-06 ENCOUNTER — Inpatient Hospital Stay: Payer: Medicare PPO | Attending: Oncology | Admitting: Oncology

## 2019-10-06 VITALS — BP 113/57 | HR 93 | Temp 97.0°F | Resp 18 | Ht 64.5 in | Wt 161.0 lb

## 2019-10-06 DIAGNOSIS — Z803 Family history of malignant neoplasm of breast: Secondary | ICD-10-CM | POA: Diagnosis not present

## 2019-10-06 DIAGNOSIS — Z8049 Family history of malignant neoplasm of other genital organs: Secondary | ICD-10-CM | POA: Diagnosis not present

## 2019-10-06 DIAGNOSIS — Z79810 Long term (current) use of selective estrogen receptor modulators (SERMs): Secondary | ICD-10-CM | POA: Diagnosis not present

## 2019-10-06 DIAGNOSIS — N6092 Unspecified benign mammary dysplasia of left breast: Secondary | ICD-10-CM | POA: Insufficient documentation

## 2019-10-06 DIAGNOSIS — Z8249 Family history of ischemic heart disease and other diseases of the circulatory system: Secondary | ICD-10-CM | POA: Diagnosis not present

## 2019-10-06 DIAGNOSIS — Z79899 Other long term (current) drug therapy: Secondary | ICD-10-CM | POA: Insufficient documentation

## 2019-10-06 DIAGNOSIS — J45909 Unspecified asthma, uncomplicated: Secondary | ICD-10-CM | POA: Diagnosis not present

## 2019-10-06 DIAGNOSIS — Z9189 Other specified personal risk factors, not elsewhere classified: Secondary | ICD-10-CM

## 2019-10-20 DIAGNOSIS — I8311 Varicose veins of right lower extremity with inflammation: Secondary | ICD-10-CM | POA: Diagnosis not present

## 2019-10-22 DIAGNOSIS — D3132 Benign neoplasm of left choroid: Secondary | ICD-10-CM | POA: Diagnosis not present

## 2019-10-26 ENCOUNTER — Ambulatory Visit: Payer: Medicare PPO | Admitting: Oncology

## 2019-12-11 ENCOUNTER — Other Ambulatory Visit: Payer: Self-pay | Admitting: Oncology

## 2019-12-17 ENCOUNTER — Other Ambulatory Visit: Payer: Self-pay | Admitting: Dermatology

## 2019-12-30 ENCOUNTER — Other Ambulatory Visit: Payer: Self-pay | Admitting: Dermatology

## 2020-01-04 DIAGNOSIS — L309 Dermatitis, unspecified: Secondary | ICD-10-CM | POA: Diagnosis not present

## 2020-01-04 DIAGNOSIS — S51811A Laceration without foreign body of right forearm, initial encounter: Secondary | ICD-10-CM | POA: Diagnosis not present

## 2020-01-04 DIAGNOSIS — S41101A Unspecified open wound of right upper arm, initial encounter: Secondary | ICD-10-CM | POA: Diagnosis not present

## 2020-01-19 DIAGNOSIS — N183 Chronic kidney disease, stage 3 unspecified: Secondary | ICD-10-CM | POA: Diagnosis not present

## 2020-01-19 DIAGNOSIS — E785 Hyperlipidemia, unspecified: Secondary | ICD-10-CM | POA: Diagnosis not present

## 2020-01-25 ENCOUNTER — Other Ambulatory Visit: Payer: Self-pay | Admitting: Obstetrics & Gynecology

## 2020-01-25 DIAGNOSIS — Z1231 Encounter for screening mammogram for malignant neoplasm of breast: Secondary | ICD-10-CM

## 2020-01-26 DIAGNOSIS — E785 Hyperlipidemia, unspecified: Secondary | ICD-10-CM | POA: Diagnosis not present

## 2020-01-26 DIAGNOSIS — N183 Chronic kidney disease, stage 3 unspecified: Secondary | ICD-10-CM | POA: Diagnosis not present

## 2020-01-26 DIAGNOSIS — R82998 Other abnormal findings in urine: Secondary | ICD-10-CM | POA: Diagnosis not present

## 2020-01-26 DIAGNOSIS — Z0000 Encounter for general adult medical examination without abnormal findings: Secondary | ICD-10-CM | POA: Diagnosis not present

## 2020-01-26 DIAGNOSIS — J45909 Unspecified asthma, uncomplicated: Secondary | ICD-10-CM | POA: Diagnosis not present

## 2020-01-26 DIAGNOSIS — I868 Varicose veins of other specified sites: Secondary | ICD-10-CM | POA: Diagnosis not present

## 2020-01-26 DIAGNOSIS — J320 Chronic maxillary sinusitis: Secondary | ICD-10-CM | POA: Diagnosis not present

## 2020-01-26 DIAGNOSIS — Z1331 Encounter for screening for depression: Secondary | ICD-10-CM | POA: Diagnosis not present

## 2020-01-26 DIAGNOSIS — E781 Pure hyperglyceridemia: Secondary | ICD-10-CM | POA: Diagnosis not present

## 2020-01-26 DIAGNOSIS — Z820 Family history of epilepsy and other diseases of the nervous system: Secondary | ICD-10-CM | POA: Diagnosis not present

## 2020-02-02 DIAGNOSIS — J320 Chronic maxillary sinusitis: Secondary | ICD-10-CM | POA: Diagnosis not present

## 2020-02-18 DIAGNOSIS — R82998 Other abnormal findings in urine: Secondary | ICD-10-CM | POA: Diagnosis not present

## 2020-02-23 ENCOUNTER — Other Ambulatory Visit: Payer: Self-pay | Admitting: Oncology

## 2020-02-24 DIAGNOSIS — N390 Urinary tract infection, site not specified: Secondary | ICD-10-CM | POA: Diagnosis not present

## 2020-03-15 ENCOUNTER — Other Ambulatory Visit: Payer: Self-pay | Admitting: Dermatology

## 2020-03-30 ENCOUNTER — Encounter: Payer: Self-pay | Admitting: Dermatology

## 2020-03-30 ENCOUNTER — Other Ambulatory Visit: Payer: Self-pay

## 2020-03-30 ENCOUNTER — Ambulatory Visit: Payer: Medicare PPO | Admitting: Dermatology

## 2020-03-30 DIAGNOSIS — Z1283 Encounter for screening for malignant neoplasm of skin: Secondary | ICD-10-CM

## 2020-03-30 DIAGNOSIS — L738 Other specified follicular disorders: Secondary | ICD-10-CM | POA: Diagnosis not present

## 2020-03-30 DIAGNOSIS — L851 Acquired keratosis [keratoderma] palmaris et plantaris: Secondary | ICD-10-CM

## 2020-03-30 DIAGNOSIS — Z86018 Personal history of other benign neoplasm: Secondary | ICD-10-CM | POA: Diagnosis not present

## 2020-03-30 DIAGNOSIS — L821 Other seborrheic keratosis: Secondary | ICD-10-CM | POA: Diagnosis not present

## 2020-03-30 DIAGNOSIS — L918 Other hypertrophic disorders of the skin: Secondary | ICD-10-CM | POA: Diagnosis not present

## 2020-03-30 DIAGNOSIS — D1801 Hemangioma of skin and subcutaneous tissue: Secondary | ICD-10-CM | POA: Diagnosis not present

## 2020-03-30 NOTE — Patient Instructions (Addendum)
Over the counter DERMIN. Use after bathing.   Call for Clobetasol if needed for scalp.

## 2020-04-12 ENCOUNTER — Encounter: Payer: Self-pay | Admitting: Dermatology

## 2020-04-12 NOTE — Progress Notes (Signed)
   Follow-Up Visit   Subjective  [name] is a 70 y.o. female who presents for the following: Annual Exam (Patient here today for yearly skin check.  Per patient she has a places on her arms and back that itch x few months no bleeding.  Check place on nose that comes and goes x years no bleeding. ).  General skin examination Location:  Duration:  Quality:  Associated Signs/Symptoms: Modifying Factors:  Severity:  Timing: Context:   Objective  Well appearing patient in no apparent distress; mood and affect are within normal limits. Objective  Right Outer Back: No sign of recurrent pigmentation  Objective  Left Abdomen (side) - Lower: Full body skin check. No atypical moles, no skin cancer  Objective  Left Lower Back: multiple 3 to 5 mm light brown textured flattopped papules  Objective  Right Breast: 3 mm pedunculated flesh-colored papule  Objective  Left Thigh - Anterior, Right Thigh - Anterior: Multiple flat tan subtly textured 5 mm papules  Objective  Right Abdomen (side) - Lower: Half dozen 1 to 2 mm smooth red papules    A full examination was performed including scalp, head, eyes, ears, nose, lips, neck, chest, axillae, abdomen, back, buttocks, bilateral upper extremities, bilateral lower extremities, hands, feet, fingers, toes, fingernails, and toenails. All findings within normal limits unless otherwise noted below.  Areas beneath undergarments not fully examined.   Assessment & Plan    History of atypical skin mole Right Outer Back  Check as needed change  Screening exam for skin cancer Left Abdomen (side) - Lower  Yearly skin check, patient encouraged to self examine twice annually.  Continued ultraviolet protection.  Seborrheic keratosis Left Lower Back  No intervention currently indicated  Skin tag Right Breast  Can leave if stable  Stucco keratoses (2) Left Thigh - Anterior; Right Thigh - Anterior  Leave if stable.  Sebaceous  hyperplasia Mid Tip of Nose  Stable to leave   Cherry angioma Right Abdomen (side) - Lower  Intervention not necessary      I, Lavonna Monarch, MD, have reviewed all documentation for this visit.  The documentation on 04/12/20 for the exam, diagnosis, procedures, and orders are all accurate and complete.

## 2020-04-18 ENCOUNTER — Inpatient Hospital Stay: Admission: RE | Admit: 2020-04-18 | Payer: Medicare PPO | Source: Ambulatory Visit

## 2020-04-18 DIAGNOSIS — R350 Frequency of micturition: Secondary | ICD-10-CM | POA: Diagnosis not present

## 2020-04-18 DIAGNOSIS — R309 Painful micturition, unspecified: Secondary | ICD-10-CM | POA: Diagnosis not present

## 2020-04-18 DIAGNOSIS — N760 Acute vaginitis: Secondary | ICD-10-CM | POA: Diagnosis not present

## 2020-04-18 DIAGNOSIS — N952 Postmenopausal atrophic vaginitis: Secondary | ICD-10-CM | POA: Diagnosis not present

## 2020-04-21 ENCOUNTER — Other Ambulatory Visit: Payer: Self-pay

## 2020-04-21 ENCOUNTER — Ambulatory Visit
Admission: RE | Admit: 2020-04-21 | Discharge: 2020-04-21 | Disposition: A | Discharge status: Home / Self Care | Payer: Medicare PPO | Source: Ambulatory Visit | Attending: Obstetrics & Gynecology | Admitting: Obstetrics & Gynecology

## 2020-04-21 DIAGNOSIS — Z1231 Encounter for screening mammogram for malignant neoplasm of breast: Secondary | ICD-10-CM

## 2020-04-21 IMAGING — MG MM DIGITAL SCREENING BILAT W/ TOMO AND CAD
8 series · 8 of 24 positions shown · non-contrast
Comparison: Previous exam(s).

CLINICAL DATA: Screening.

EXAM:
DIGITAL SCREENING BILATERAL MAMMOGRAM WITH TOMOSYNTHESIS AND CAD
TECHNIQUE: Bilateral screening digital craniocaudal and mediolateral oblique
mammograms were obtained. Bilateral screening digital breast
tomosynthesis was performed. The images were evaluated with
computer-aided detection.

[R MLO synth-2D]
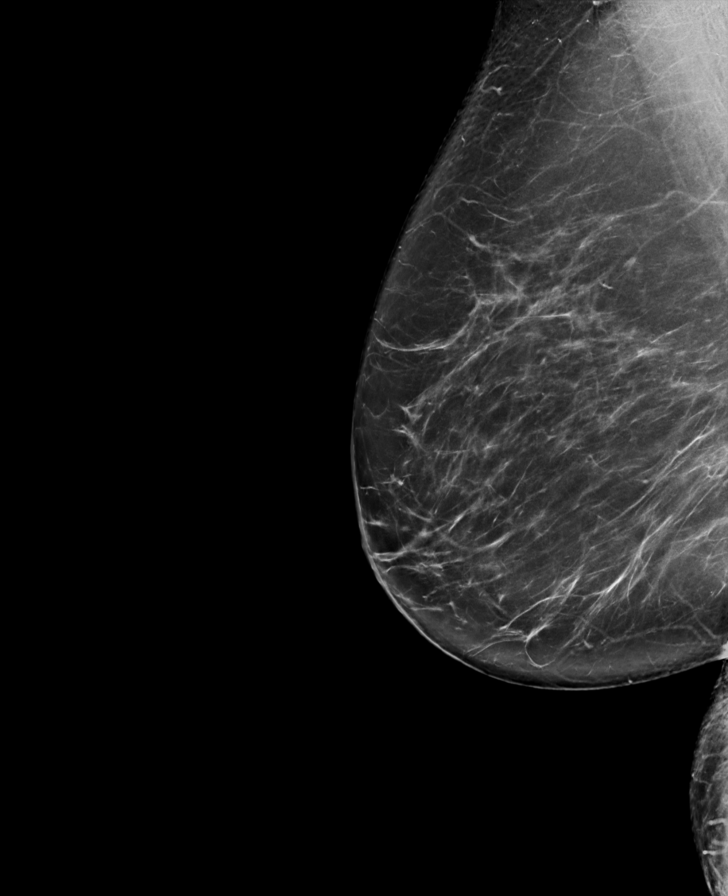

[R CC synth-2D]
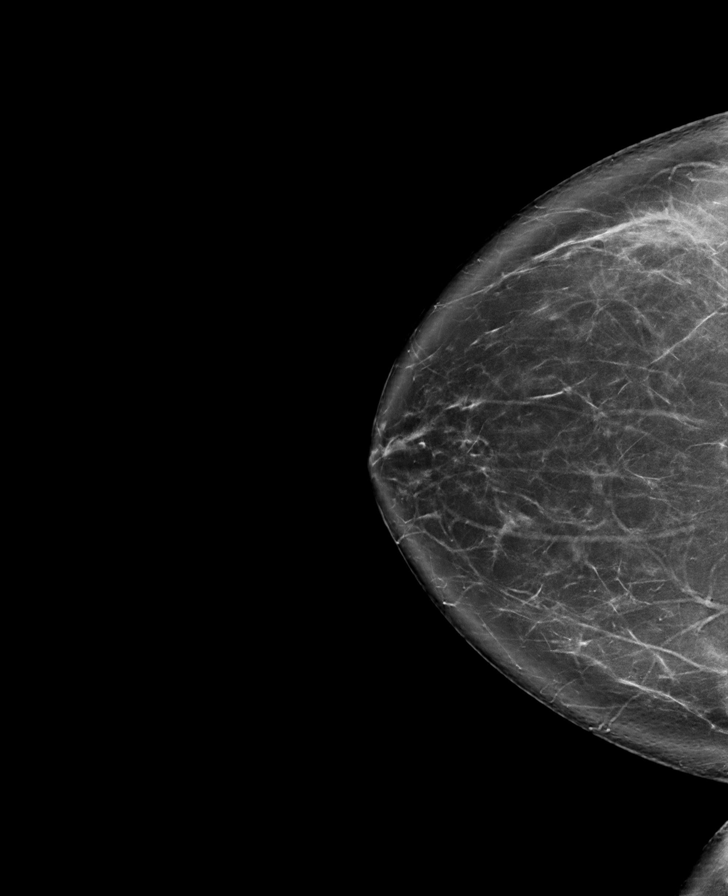

[L CC synth-2D]
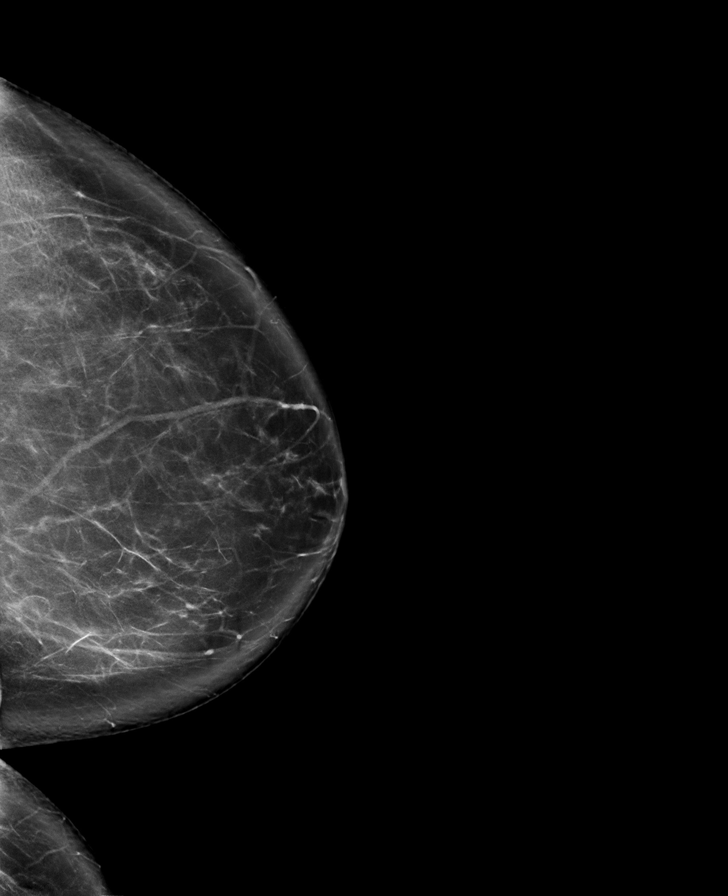

[L MLO synth-2D]
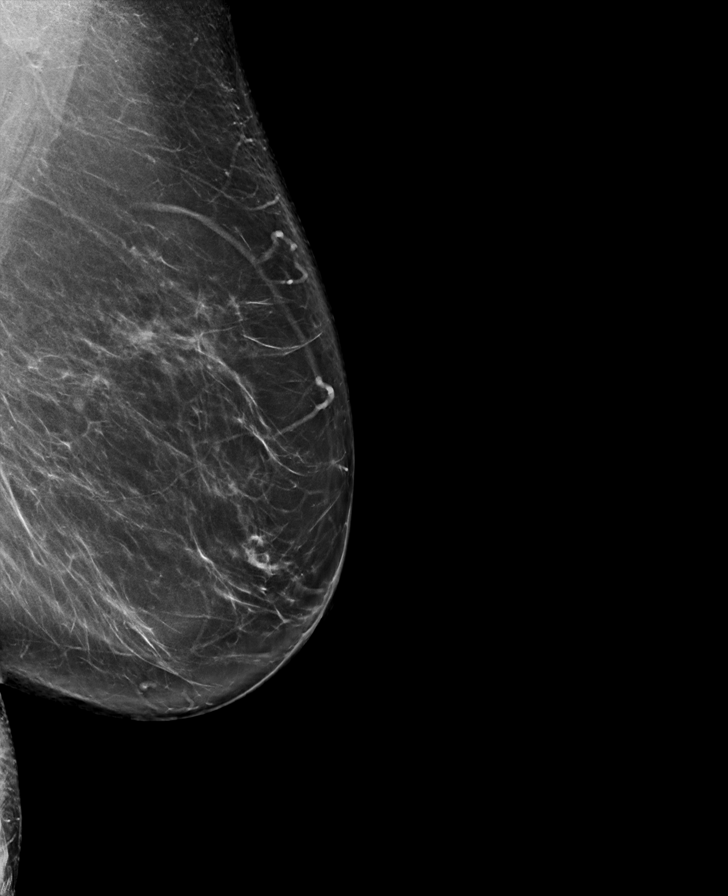

[R CC tomo · tomo slice 47/92.0]
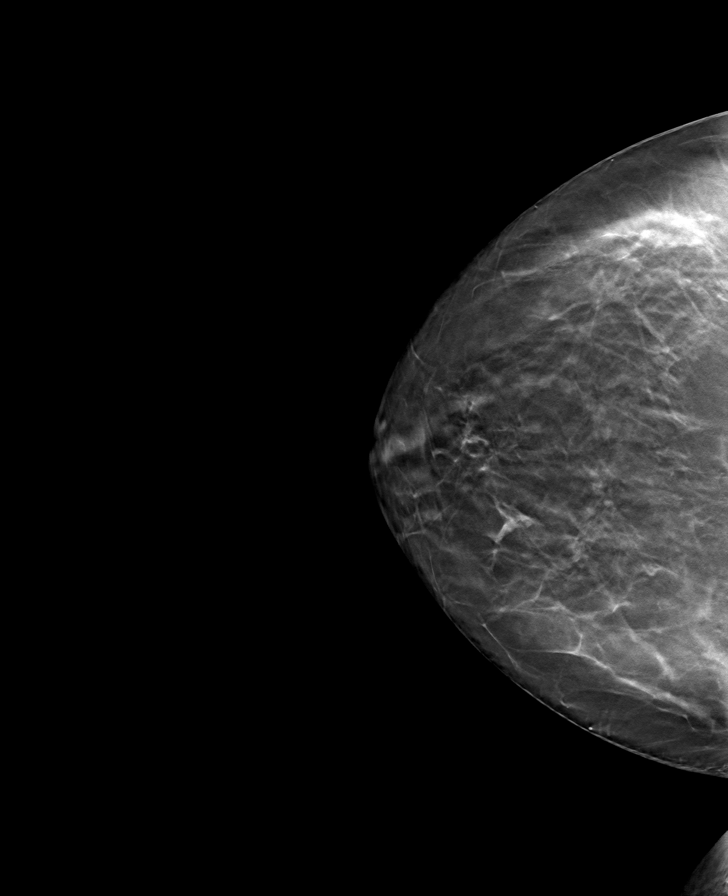

[R MLO tomo · tomo slice 45/89.0]
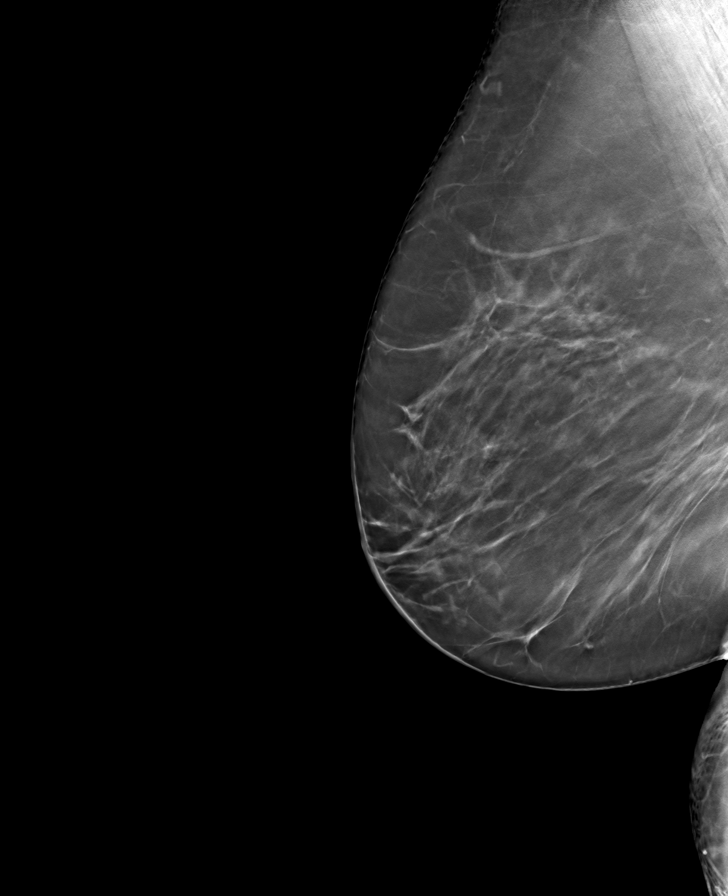

[L CC tomo · tomo slice 47/93.0]
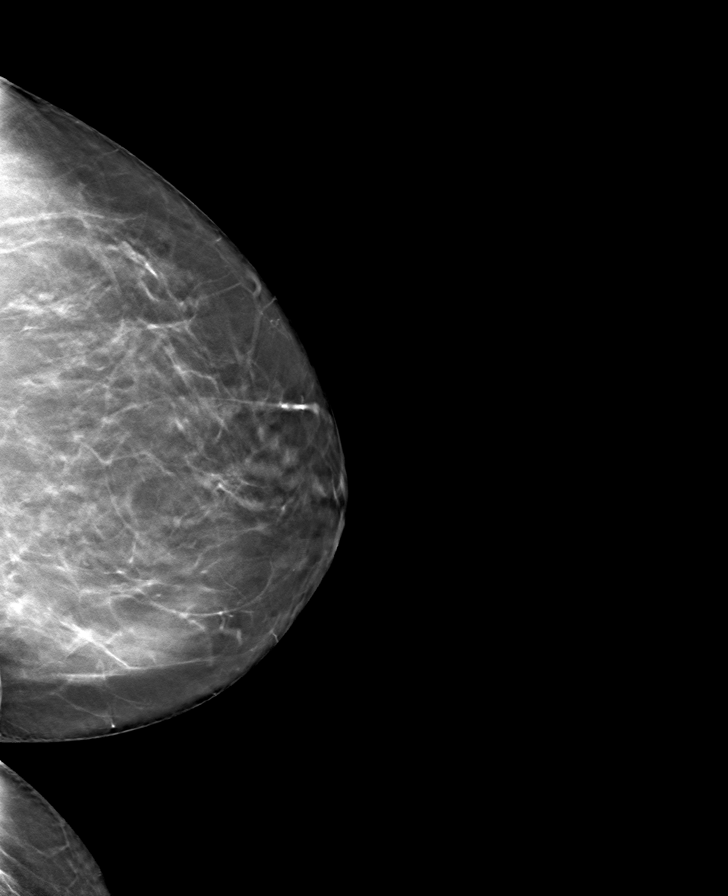

[L MLO tomo · tomo slice 46/91.0]
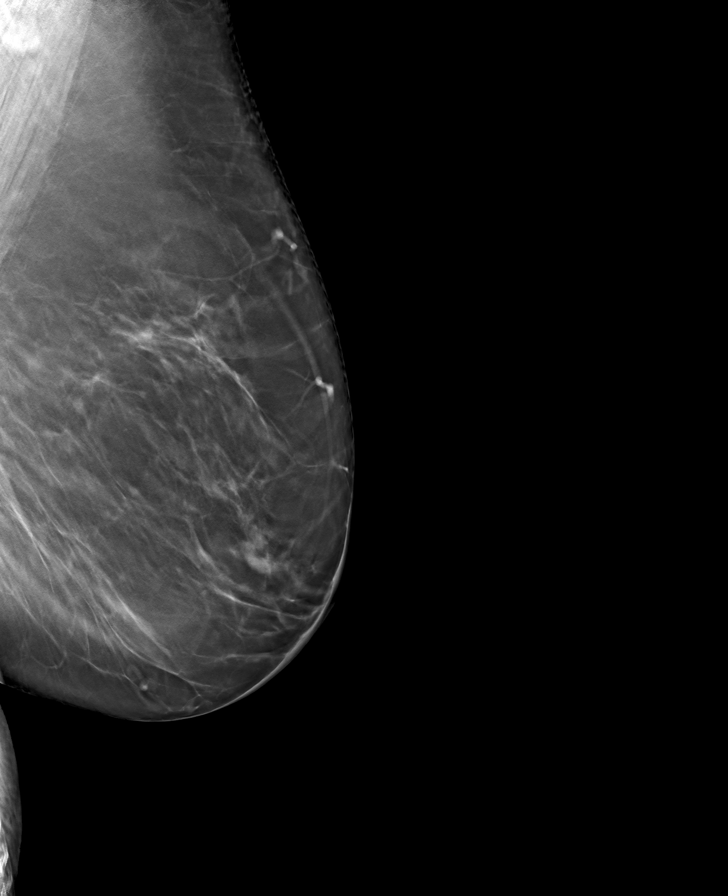

[8 of 24 positions shown; findings below may reference images not displayed]

ACR Breast Density Category b: There are scattered areas of
fibroglandular density.
FINDINGS: There are no findings suspicious for malignancy. The images were
evaluated with computer-aided detection.
IMPRESSION: No mammographic evidence of malignancy. A result letter of this
screening mammogram will be mailed directly to the patient.

RECOMMENDATION:
Screening mammogram in one year. (Code:[OD])

BI-RADS CATEGORY  1: Negative.

## 2020-05-23 ENCOUNTER — Other Ambulatory Visit: Payer: Self-pay | Admitting: Oncology

## 2020-06-10 DIAGNOSIS — M25561 Pain in right knee: Secondary | ICD-10-CM | POA: Diagnosis not present

## 2020-06-29 DIAGNOSIS — M25561 Pain in right knee: Secondary | ICD-10-CM | POA: Diagnosis not present

## 2020-07-11 ENCOUNTER — Telehealth: Payer: Self-pay | Admitting: Oncology

## 2020-07-11 NOTE — Telephone Encounter (Signed)
Patient called in to schedule 1 year f/u. Checked previous LOS, scheduled appt. Confirmed with patient

## 2020-07-22 DIAGNOSIS — M25561 Pain in right knee: Secondary | ICD-10-CM | POA: Diagnosis not present

## 2020-07-28 DIAGNOSIS — M25561 Pain in right knee: Secondary | ICD-10-CM | POA: Diagnosis not present

## 2020-08-19 ENCOUNTER — Other Ambulatory Visit: Payer: Self-pay | Admitting: Oncology

## 2020-09-01 DIAGNOSIS — Z1211 Encounter for screening for malignant neoplasm of colon: Secondary | ICD-10-CM | POA: Diagnosis not present

## 2020-09-01 DIAGNOSIS — Z86010 Personal history of colonic polyps: Secondary | ICD-10-CM | POA: Diagnosis not present

## 2020-09-13 DIAGNOSIS — M25561 Pain in right knee: Secondary | ICD-10-CM | POA: Diagnosis not present

## 2020-09-18 ENCOUNTER — Other Ambulatory Visit: Payer: Self-pay | Admitting: Oncology

## 2020-10-05 ENCOUNTER — Ambulatory Visit
Admission: RE | Admit: 2020-10-05 | Discharge: 2020-10-05 | Disposition: A | Discharge status: Home / Self Care | Payer: Medicare PPO | Source: Ambulatory Visit | Attending: Oncology | Admitting: Oncology

## 2020-10-05 DIAGNOSIS — N6092 Unspecified benign mammary dysplasia of left breast: Secondary | ICD-10-CM

## 2020-10-05 DIAGNOSIS — Z9189 Other specified personal risk factors, not elsewhere classified: Secondary | ICD-10-CM

## 2020-10-05 DIAGNOSIS — R928 Other abnormal and inconclusive findings on diagnostic imaging of breast: Secondary | ICD-10-CM | POA: Diagnosis not present

## 2020-10-05 IMAGING — MR MR BREAST BILAT WO/W CM
8 of 12 series · 33 of 48 positions shown · IV contrast (gadavist)
Comparison: Previous exam(s).

CLINICAL DATA: High risk patient. Greater than 20% lifetime risk of
breast cancer. History of atypical lobular hyperplasia on the left.

LABS:  None
EXAM:
BILATERAL BREAST MRI WITH AND WITHOUT CONTRAST
TECHNIQUE: Multiplanar, multisequence MR images of both breasts were obtained
prior to and following the intravenous administration of 7 ml of
Gadavist

[Series 2: t2_tirm_tra ipat (a-p) · axial · 3.0mm · 0.70mm/px · 1 of 55 slices shown]
[im 1/55]
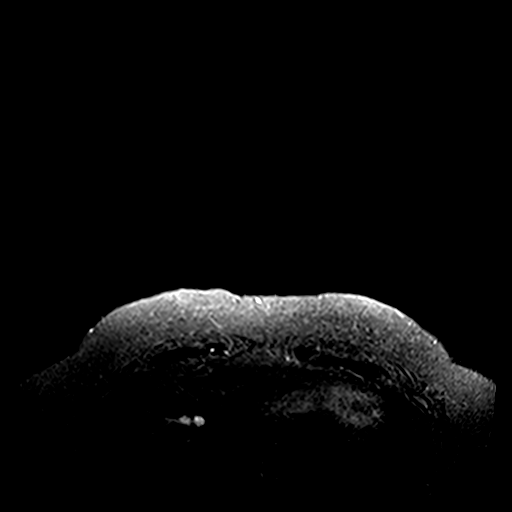

[Series 3: fl3d pre-cm no · axial · non-contrast · 1.2mm · 0.89mm/px · z∈[-86,+86]mm · 5 of 144 slices shown]
[im 1/144]
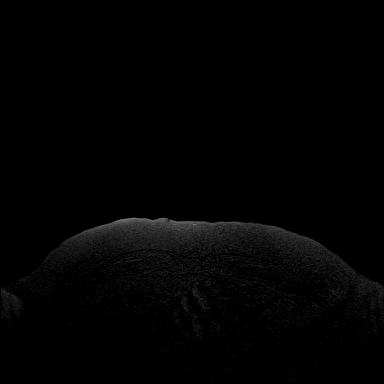
[im 36/144]
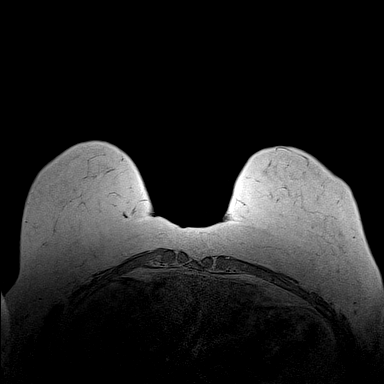
[im 72/144]
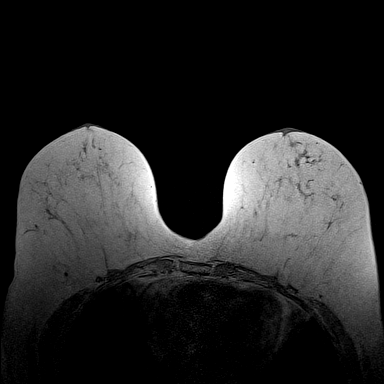
[im 108/144]
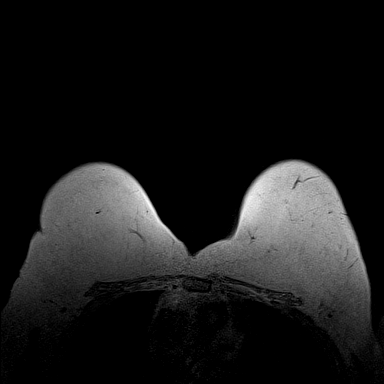
[im 144/144]
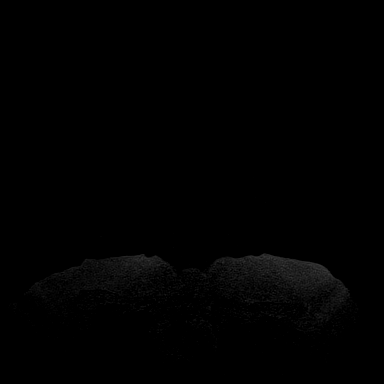

[Series 4: fl3d pre-cm · axial · non-contrast · 1.2mm · 0.89mm/px · z∈[-86,+86]mm · 5 of 144 slices shown]
[im 1/144]
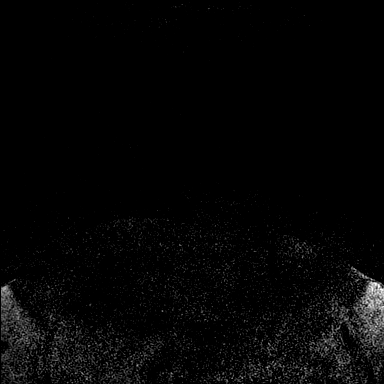
[im 36/144]
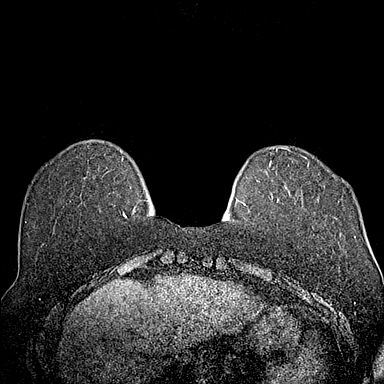
[im 72/144]
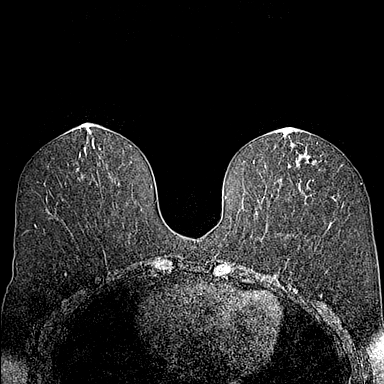
[im 108/144]
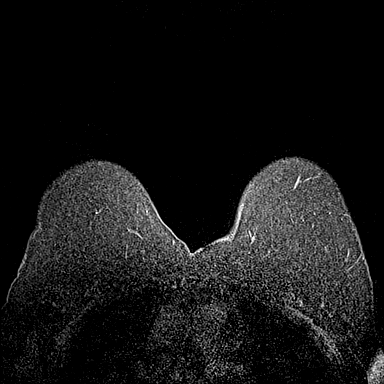
[im 144/144]
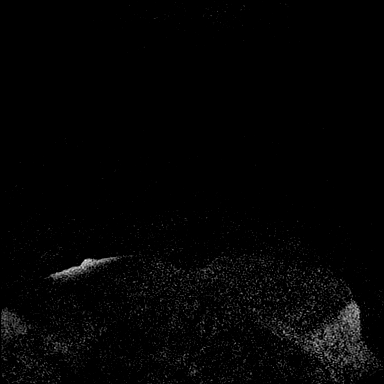

[Series 5: fl3d post-cm 20 · axial · 1.2mm · 0.89mm/px · z∈[-86,+86]mm · 5 of 144 slices shown (1 of 3)]
[im 1/144]
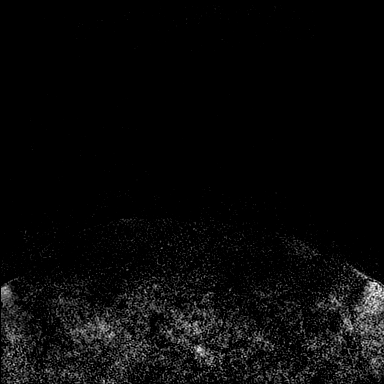
[im 36/144]
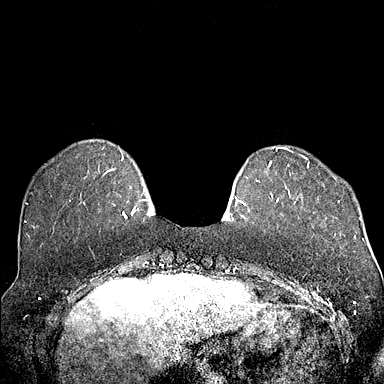
[im 72/144]
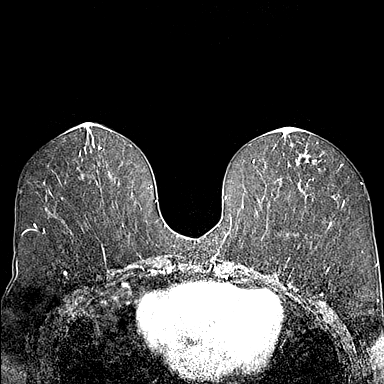
[im 108/144]
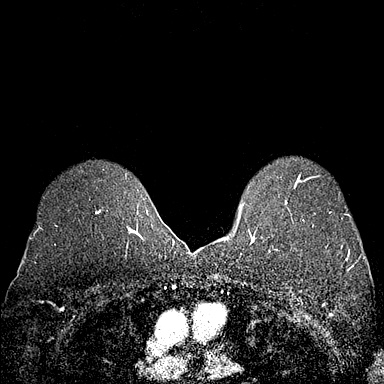
[im 144/144]
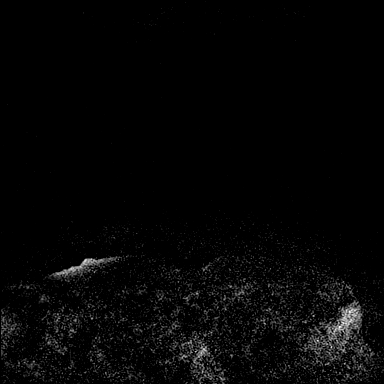

[Series 6: fl3d post-cm 20 · axial · 1.2mm · 0.89mm/px · z∈[-86,+86]mm · 5 of 144 slices shown (2 of 3)]
[im 1/144]
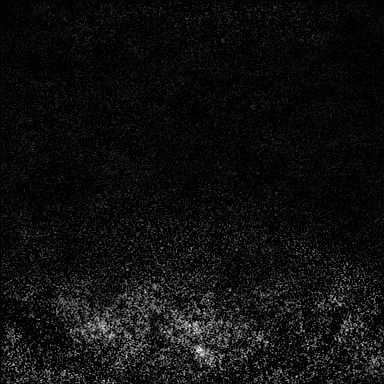
[im 36/144]
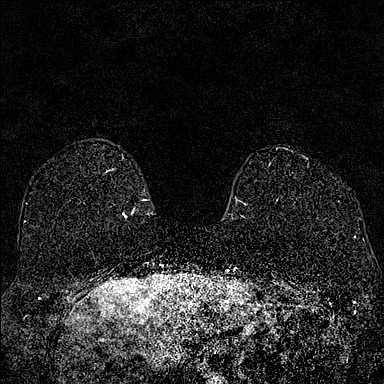
[im 72/144]
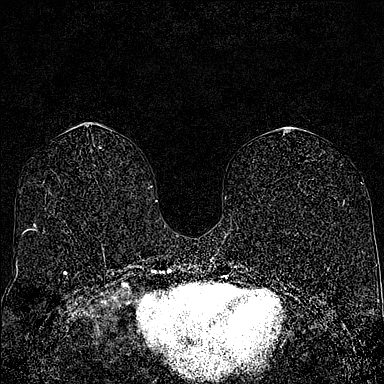
[im 108/144]
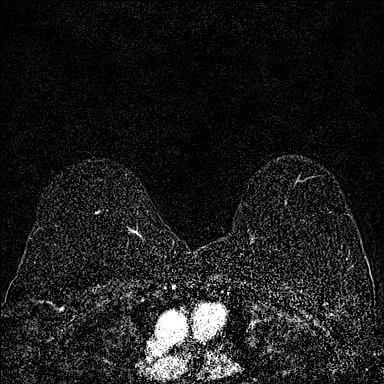
[im 144/144]
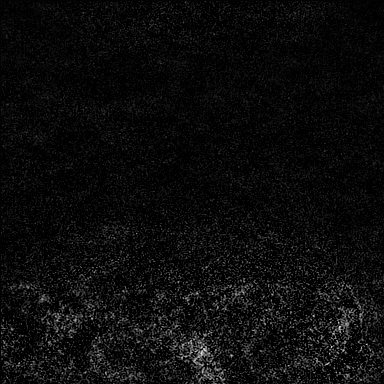

[Series 7: fl3d post-cm 20 · axial · 172.8mm · 0.89mm/px · 1 of 1 slices shown (3 of 3)]
[im 1/1]
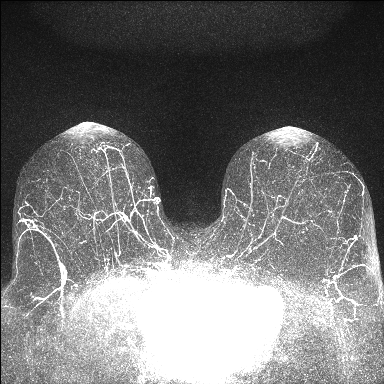

[Series 8: fl3d post-cm 3 · axial · 1.2mm · 0.89mm/px · z∈[-86,+86]mm · 6 of 144 slices shown (1 of 2)]
[im 1/144]
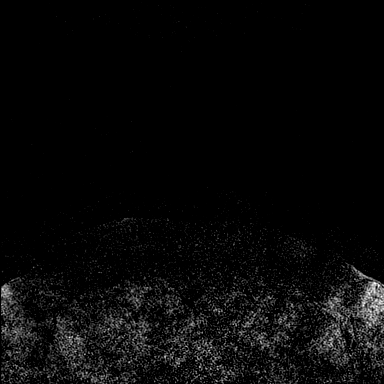
[im 29/144]
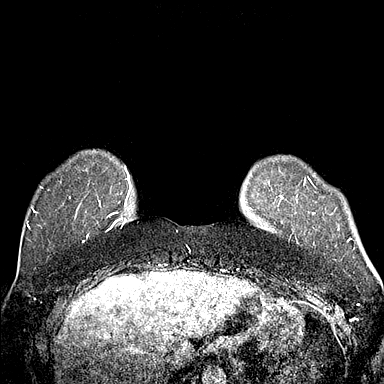
[im 58/144]
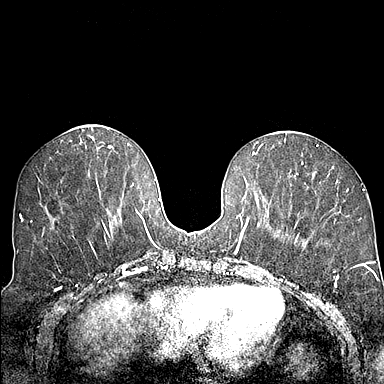
[im 86/144]
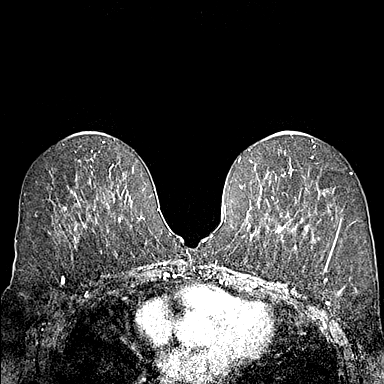
[im 115/144]
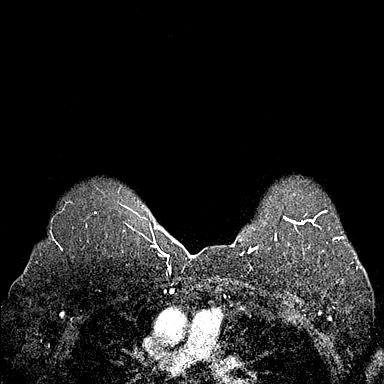
[im 144/144]
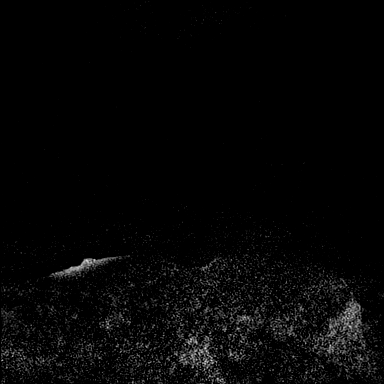

[Series 9: fl3d post-cm 3 · axial · 1.2mm · 0.89mm/px · z∈[-86,+51]mm · 5 of 144 slices shown (2 of 2)]
[im 1/144]
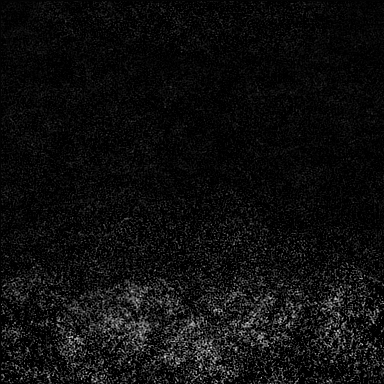
[im 29/144]
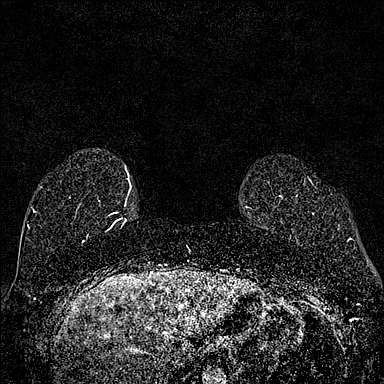
[im 58/144]
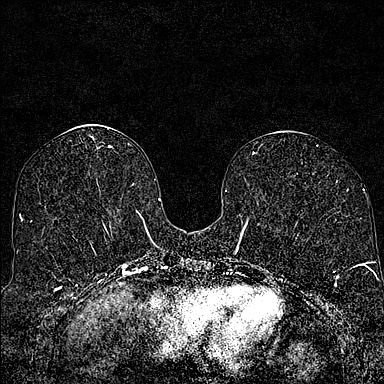
[im 86/144]
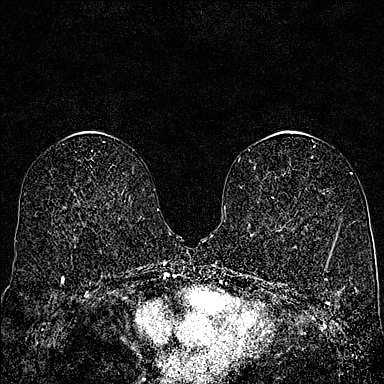
[im 115/144]
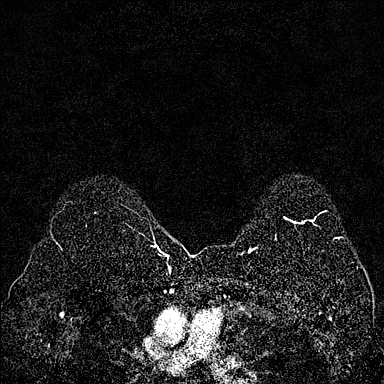

[33 of 48 positions shown; findings below may reference images not displayed]

Three-dimensional MR images were rendered by post-processing of the
original MR data on an independent workstation. The
three-dimensional MR images were interpreted, and findings are
reported in the following complete MRI report for this study. Three
dimensional images were evaluated at the independent interpreting
workstation using the DynaCAD thin client.
FINDINGS: Breast composition: b. Scattered fibroglandular tissue.

Background parenchymal enhancement: Minimal

Right breast: No mass or abnormal enhancement.

Left breast: No mass or abnormal enhancement.

Lymph nodes: No abnormal appearing lymph nodes.

Ancillary findings:  None.
IMPRESSION: No MRI evidence of malignancy in either breast.

RECOMMENDATION:
Annual screening mammography in [DATE]. Annual breast MRI in
[DATE].

BI-RADS CATEGORY  1: Negative.

## 2020-10-05 MED ORDER — GADOBENATE DIMEGLUMINE 529 MG/ML IV SOLN
7.0000 mL | Freq: Once | INTRAVENOUS | Status: AC | PRN
  Administered 2020-10-05: 7 mL via INTRAVENOUS

## 2020-10-30 NOTE — Progress Notes (Signed)
[facility]  [phone] [phone]    ID: [name] DOB: 12-02-1950  MR#: [id number]  [id number]#:[id number]  Patient Care Team: Candice Infante, MD as PCP - General (Internal Medicine) Candice Campbell, MD as Consulting Physician (Gastroenterology) Candice Balls, MD as Consulting Physician (Orthopedic Surgery) Candice Monarch, MD as Consulting Physician (Dermatology) Candice Fus, MD as Consulting Physician (Obstetrics and Gynecology) Candice Kussmaul, MD as Consulting Physician (General Surgery) OTHER MD:  CHIEF COMPLAINT: Breast cancer high risk (atypical lobular hyperplasia).  CURRENT TREATMENT: tamoxifen; intensified screening   INTERVAL HISTORY: [name] returns today for follow up of her high risk for breast cancer.  She continues on tamoxifen.  Hot flashes are very variable and tend to be worse in the summer.  Since her last visit, she underwent bilateral screening mammography with tomography at [facility] on 04/21/2020 showing: breast density category B; no evidence of malignancy in either breast.  She also underwent breast MRI on 10/05/2020 showing breast composition B; no evidence of malignancy in either breast.   REVIEW OF SYSTEMS: [name] exercises by walking at least 5 days a week at least 2 miles.  She has had some vaginal spotting.  Recall that she is status post hysterectomy.  Aside from this a detailed review of systems today was stable   COVID 19 VACCINATION STATUS: Pfizer x3 as of October 2022   HISTORY OF CURRENT ILLNESS:  From the original intake note:  "[name]" had routine screening mammography at [facility] showing a possible assymetry in the left breast. She underwent unilateral left diagnostic mammography with tomography and left breast ultrasonography at [facility] on 03/11/2018 showing: Breast Density Category B. Spot compression tomosynthesis images through the region of concern in the superior left breast  demonstrates a persistent asymmetry measuring approximately 0.9 cm. There is no associated distortion or suspicious calcifications. Ultrasound of the left breast at 11 o'clock, 7 cm from the nipple demonstrates an irregular hypoechoic mass measuring 0.6 x 0.4 x 0.5 cm. Ultrasound of the left axilla demonstrates multiple normal-appearing lymph nodes.  Accordingly on 03/12/2018 she proceeded to biopsy of the left breast area in question. The pathology from this procedure showed ([id number]): Flat epithelial atypia with background columnar cell hyperplasia, 11 o'clock.  This was felt to be discordant.  She underwent a left lumpectomy and a laparoscopic cholecystectomy on 05/30/2018. The pathology from this procedure showed ([id number]): 1. Breast, lumpectomy, left with radioactive seed - Biopsy site changes. Columnar cell hyperplasia. Flat epithelial atypia. Lobular neoplasia (atypical lobular hyperplasia) 2. Gallbladder   - Chronic cholecystitis with cholelithiasis  The patient's subsequent history is as detailed below.   PAST MEDICAL HISTORY: Past Medical History:  Diagnosis Date   Allergy    Anemia    Asthma    Depression    GERD (gastroesophageal reflux disease)    Headache    Palpitations 2016   Cardiolite neg 2005, echo 2011 EF 55%  Gastroparesis possibly secondary to remote pyloric stenosis correction   PAST SURGICAL HISTORY: Past Surgical History:  Procedure Laterality Date   ABDOMINAL HYSTERECTOMY     BREAST BIOPSY Left    BREAST LUMPECTOMY Left 05/2018   abnormal cells   BREAST LUMPECTOMY WITH RADIOACTIVE SEED LOCALIZATION Left 05/30/2018   Procedure: LEFT BREAST LUMPECTOMY WITH RADIOACTIVE SEED LOCALIZATION;  Surgeon: Candice Kussmaul, MD;  Location: [facility];  Service: General;  Laterality: Left;   BUNIONECTOMY     CESAREAN SECTION     CHOLECYSTECTOMY N/A  05/30/2018   Procedure: LAPAROSCOPIC CHOLECYSTECTOMY WITH INTRAOPERATIVE CHOLANGIOGRAM;  Surgeon: Candice Kussmaul, MD;   Location: [facility] OR;  Service: General;  Laterality: N/A;   pyloric stenosis     at 23 weeks old   [place]    Bilateral salpingo-oophorectomy   FAMILY HISTORY: Family History  Problem Relation Age of Onset   Hypertension Mother    Cancer Mother        Uterine and Breast   Cerebral aneurysm Mother    Breast cancer Mother    Arthritis Father    Hypertension Father    Glaucoma Father   [name]'s father died from Alzheimer's disease at age 70.  He had a history of bladder cancer.  Patients' mother is living at the age of 70 as of August 2020.   She was diagnosed with breast cancer in her 32s and also uterine cancer. The patient has 1 sister, no brothers. Patient denies anyone in her family having ovarian, prostate, or pancreatic cancer.    GYNECOLOGIC HISTORY:  No LMP recorded. Patient has had a hysterectomy. 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 Her husband, [name], is a Engineer, maintenance (IT). Her son, [name], lives in [place], is an Optometrist, is married and has 2 children. Her daughter, [name], is an Scientist, physiological that works with farmer education near [place].  She has 1 child and 1 on the way. [name] belongs to a [employer].    ADVANCED DIRECTIVES: In the absence of any documentation, [name]'s spouse, [name], is automatically her healthcare power of attorney.      HEALTH MAINTENANCE: Social History   Tobacco Use   Smoking status: Never   Smokeless tobacco: Never  Vaping Use   Vaping Use: Never used  Substance Use Topics   Alcohol use: No   Drug use: No    Colonoscopy: 2017/[facility]  PAP: Status post hysterectomy  Bone density: Due in 09/2018 Mammography: Up to date  Allergies  Allergen Reactions   Fish Allergy Hives   Metoclopramide Hcl Other (See  Comments)    Facial tightness    Penicillins Hives    Did it involve swelling of the face/tongue/throat, SOB, or low BP? No Did it involve sudden or severe rash/hives, skin peeling, or any reaction on the inside of your mouth or nose? No Did you need to seek medical attention at a hospital or doctor's office? No When did it last happen? ~40 years ago If all above answers are "NO", may proceed with cephalosporin use.    Shellfish Allergy Hives   Sulfamethoxazole-Trimethoprim Hives   Ciprofloxacin Palpitations   Nickel Rash    Current Outpatient Medications  Medication Sig Dispense Refill   acetaminophen (TYLENOL) 500 MG tablet Take 1,000 mg by mouth every 6 (six) hours as needed (pain.).     albuterol (PROAIR HFA) 108 (90 BASE) MCG/ACT inhaler Inhale 1-2 puffs into the lungs every 4 (four) hours as needed. (Patient taking differently: Inhale 1-2 puffs into the lungs every 4 (four) hours as needed (asthma/wheezing/shortness of breath.).) 18 g 3   Biotin 5000 MCG TABS Take 5,000 mcg by mouth daily.     Cholecalciferol (VITAMIN D-3) 125 MCG (5000 UT) TABS Take 5,000 Units by mouth at bedtime.     Cinnamon 500 MG capsule  Take 500 mg by mouth every evening.      clobetasol (TEMOVATE) 0.05 % external solution APPLY TO THE AFFECTED AREA AT BEDTIME 50 mL 4   conjugated estrogens (PREMARIN) vaginal cream Place 1 Applicatorful vaginally 2 (two) times a week. Mondays & Thursdays.     Dapsone 7.5 % GEL APPLY TO FACE EVERY NIGHT AT BEDTIME AS DIRECTED 60 g 1   desonide (DESOWEN) 0.05 % ointment desonide 0.05 % topical ointment     famotidine (PEPCID) 20 MG tablet Take 20 mg by mouth 2 (two) times daily.     fexofenadine (ALLEGRA) 180 MG tablet Take 180 mg by mouth at bedtime.     Flaxseed, Linseed, (FLAX SEED OIL) 1000 MG CAPS Take 1,000 mg by mouth at bedtime.     hydrochlorothiazide (HYDRODIURIL) 25 MG tablet Take 25 mg by mouth daily as needed (fluid retention.).     magnesium oxide (MAG-OX) 400  MG tablet Take 400 mg by mouth every evening.      montelukast (SINGULAIR) 10 MG tablet take 1 tablet by mouth once daily (Patient taking differently: Take 10 mg by mouth at bedtime.) 90 tablet 3   Multiple Vitamins-Minerals (MULTIVITAMIN ADULTS 50+ PO) Take by mouth daily.     nystatin-triamcinolone ointment (MYCOLOG) nystatin-triamcinolone 100,000 unit/gram-0.1 % topical ointment     polycarbophil (FIBERCON) 625 MG tablet Take 625 mg by mouth daily.     Probiotic Product (PROBIOTIC PO) Take 1 capsule by mouth daily.     SPIRIVA HANDIHALER 18 MCG inhalation capsule inhale THE CONTENTS OF ONE CAPSULE IN THE HANDIHALER once daily (Patient taking differently: Place 18 mcg into inhaler and inhale daily.) 30 capsule 0   SYSTANE 0.4-0.3 % SOLN Place 1 drop into both eyes 4 (four) times daily as needed (dry/irritated eyes.).     tamoxifen (NOLVADEX) 20 MG tablet TAKE 1 TABLET(20 MG) BY MOUTH DAILY 90 tablet 0   TURMERIC PO Take 1,000 mg by mouth at bedtime.     verapamil (VERELAN PM) 180 MG 24 hr capsule Take 180 mg by mouth at bedtime.     vitamin B-12 (CYANOCOBALAMIN) 1000 MCG tablet Take 1,000 mcg by mouth at bedtime.      Current Facility-Administered Medications  Medication Dose Route Frequency Provider Last Rate Last Admin   ipratropium-albuterol (DUONEB) 0.5-2.5 (3) MG/3ML nebulizer solution 3 mL  3 mL Nebulization Once Kelby Aline, PA-C        OBJECTIVE: White woman who appears younger than stated age  Vitals:   10/31/20 1037  BP: (!) 149/57  Pulse: 85  Resp: 18  Temp: (!) 97.3 F (36.3 C)  SpO2: 100%      Body mass index is 27.02 kg/m.   Wt Readings from Last 3 Encounters:  10/31/20 159 lb 14.4 oz (72.5 kg)  10/06/19 161 lb (73 kg)  11/04/18 153 lb 9.6 oz (69.7 kg)      ECOG FS:1 - Symptomatic but completely ambulatory  Sclerae unicteric, EOMs intact Wearing a mask No cervical or supraclavicular adenopathy Lungs no rales or rhonchi Heart regular rate and rhythm Abd  soft, nontender, positive bowel sounds MSK no focal spinal tenderness, no upper extremity lymphedema Neuro: nonfocal, well oriented, appropriate affect Breasts: I do not palpate a mass in either breast.  There are no skin or nipple changes of concern.  Both axillae are benign   LAB RESULTS:  CMP     Component Value Date/Time   NA 135 05/22/2018 1042   K 4.0 05/22/2018  1042   CL 99 05/22/2018 1042   CO2 29 05/22/2018 1042   GLUCOSE 96 05/22/2018 1042   BUN 11 05/22/2018 1042   CREATININE 0.95 05/22/2018 1042   CREATININE 0.90 02/25/2015 1042   CALCIUM 9.4 05/22/2018 1042   PROT 7.2 05/22/2018 1042   ALBUMIN 3.8 05/22/2018 1042   AST 22 05/22/2018 1042   ALT 16 05/22/2018 1042   ALKPHOS 63 05/22/2018 1042   BILITOT 0.5 05/22/2018 1042   GFRNONAA >60 05/22/2018 1042   GFRNONAA 68 02/25/2015 1042   GFRAA >60 05/22/2018 1042   GFRAA 78 02/25/2015 1042    No results found for: TOTALPROTELP, ALBUMINELP, A1GS, A2GS, BETS, BETA2SER, GAMS, MSPIKE, SPEI  No results found for: KPAFRELGTCHN, LAMBDASER, KAPLAMBRATIO  Lab Results  Component Value Date   WBC 7.9 05/22/2018   NEUTROABS 2.6 02/25/2015   HGB 12.1 05/22/2018   HCT 37.3 05/22/2018   MCV 90.8 05/22/2018   PLT 361 05/22/2018    No results found for: LABCA2  No components found for: [id number]  No results for input(s): INR in the last 168 hours.  No results found for: LABCA2  No results found for: [id number]  No results found for: [id number]  No results found for: NFA213  No results found for: CA2729  No components found for: HGQUANT  No results found for: CEA1 / No results found for: CEA1   No results found for: AFPTUMOR  No results found for: CHROMOGRNA  No results found for: TOTALPROTELP, ALBUMINELP, A1GS, A2GS, BETS, BETA2SER, GAMS, MSPIKE, SPEI (this displays SPEP labs)  No results found for: KPAFRELGTCHN, LAMBDASER, KAPLAMBRATIO (kappa/lambda light chains)  No results found for: HGBA, HGBA2QUANT,  HGBFQUANT, HGBSQUAN (Hemoglobinopathy evaluation)   No results found for: LDH  Lab Results  Component Value Date   IRON 81 08/17/2014   TIBC 341 08/17/2014   IRONPCTSAT 24 08/17/2014   (Iron and TIBC)  Lab Results  Component Value Date   FERRITIN 21 08/17/2014    Urinalysis    Component Value Date/Time   COLORURINE YELLOW 09/20/2014 1143   APPEARANCEUR CLEAR 09/20/2014 1143   LABSPEC 1.019 09/20/2014 1143   PHURINE 6.5 09/20/2014 1143   GLUCOSEU NEGATIVE 09/20/2014 1143   HGBUR NEGATIVE 09/20/2014 1143   BILIRUBINUR negative 02/12/2015 1457   KETONESUR negative 02/12/2015 1457   KETONESUR NEGATIVE 09/20/2014 1143   PROTEINUR negative 02/12/2015 1457   PROTEINUR NEGATIVE 09/20/2014 1143   UROBILINOGEN 0.2 02/12/2015 1457   UROBILINOGEN 0.2 05/11/2013 1029   NITRITE Negative 02/12/2015 1457   NITRITE NEGATIVE 09/20/2014 1143   LEUKOCYTESUR small (1+) (A) 02/12/2015 1457    STUDIES:  MR BREAST BILATERAL W WO CONTRAST INC CAD  Result Date: 10/06/2020 CLINICAL DATA:  High risk patient. Greater than 20% lifetime risk of breast cancer. 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 LABS:  None EXAM: BILATERAL BREAST MRI WITH AND WITHOUT CONTRAST TECHNIQUE: Multiplanar, multisequence MR images of both breasts were obtained prior to and following the intravenous administration of 7 ml of Gadavist Three-dimensional MR images were rendered by post-processing of the original MR data on an independent workstation. The three-dimensional MR images were interpreted, and findings are reported in the following complete MRI report for this study. Three dimensional images were evaluated at the independent interpreting workstation using the DynaCAD thin client. COMPARISON:  Previous exam(s). FINDINGS: Breast composition: b. Scattered fibroglandular tissue. Background parenchymal enhancement: Minimal Right breast: No mass or abnormal enhancement. Left breast: No mass or abnormal  enhancement. Lymph nodes: No  abnormal appearing lymph nodes. Ancillary findings:  None. IMPRESSION: No MRI evidence of malignancy in either breast. RECOMMENDATION: Annual screening mammography in April 2023. Annual breast MRI in October of 2023. BI-RADS CATEGORY  1: Negative. Electronically Signed   By: Dorise Bullion III M.D.   On: 10/06/2020 13:44     ELIGIBLE FOR AVAILABLE RESEARCH PROTOCOL: no   ASSESSMENT: 70 y.o. [place], [place] woman status post left lumpectomy 05/30/2018 showing atypical lobular hyperplasia --lifetime breast cancer risk 20-25%  (1) risk reduction: Tamoxifen started 08/04/2018  (2) intensified screening:  (a) breast MRI yearly  (b) mammography 6 months apart from breast MRI  (c) biannual breast exam   PLAN: [name] is now a little over 2 years into her 5 years of tamoxifen.  She is tolerating it well.  When she completes 5 years she will have cut her risk of breast cancer developing in half.  She is able to obtain the MRIs at a very reasonable cost.  They provide her significant reassurance.  Her breast density now is down to B.  If there is further breast density loss, to A, I certainly would recommend stopping yearly MRIs.  I am puzzled by her "spotting".  She is status post hysterectomy.  The symptom is not associated with intercourse.  I have asked her to bring it to her gynecologist attention.  Otherwise she will return to see [place] in 1 year after her MRI October 2023.  She knows to call for any other issue that may develop before then.  Total encounter time 20 minutes.*   Lexia Vandevender, Virgie Dad, MD  10/31/20 10:48 AM Medical Oncology and Hematology [facility] [place], [place] [address] Tel. [phone]    Fax. [phone]    I, Wilburn Mylar, am acting as scribe for Dr. Virgie Dad. Ellington Cornia.  I, Lurline Del MD, have reviewed the above documentation for accuracy and completeness, and I agree with the above.   *Total  Encounter Time as defined by the Centers for Medicare and Medicaid Services includes, in addition to the face-to-face time of a patient visit (documented in the note above) non-face-to-face time: obtaining and reviewing outside history, ordering and reviewing medications, tests or procedures, care coordination (communications with other health care professionals or caregivers) and documentation in the medical record.

## 2020-10-31 ENCOUNTER — Inpatient Hospital Stay: Payer: Medicare PPO | Attending: Oncology | Admitting: Oncology

## 2020-10-31 ENCOUNTER — Other Ambulatory Visit: Payer: Self-pay

## 2020-10-31 VITALS — BP 149/57 | HR 85 | Temp 97.3°F | Resp 18 | Ht 64.5 in | Wt 159.9 lb

## 2020-10-31 DIAGNOSIS — N6092 Unspecified benign mammary dysplasia of left breast: Secondary | ICD-10-CM | POA: Diagnosis not present

## 2020-10-31 DIAGNOSIS — Z808 Family history of malignant neoplasm of other organs or systems: Secondary | ICD-10-CM | POA: Insufficient documentation

## 2020-10-31 DIAGNOSIS — Z9189 Other specified personal risk factors, not elsewhere classified: Secondary | ICD-10-CM

## 2020-10-31 DIAGNOSIS — Z803 Family history of malignant neoplasm of breast: Secondary | ICD-10-CM | POA: Insufficient documentation

## 2020-11-17 ENCOUNTER — Other Ambulatory Visit: Payer: Self-pay | Admitting: Oncology

## 2020-12-19 DIAGNOSIS — H1132 Conjunctival hemorrhage, left eye: Secondary | ICD-10-CM | POA: Diagnosis not present

## 2020-12-24 ENCOUNTER — Other Ambulatory Visit: Payer: Self-pay | Admitting: Dermatology

## 2021-02-13 ENCOUNTER — Other Ambulatory Visit: Payer: Self-pay

## 2021-02-13 ENCOUNTER — Ambulatory Visit: Payer: Medicare PPO | Admitting: Dermatology

## 2021-02-13 ENCOUNTER — Encounter: Payer: Self-pay | Admitting: Dermatology

## 2021-02-13 DIAGNOSIS — L309 Dermatitis, unspecified: Secondary | ICD-10-CM | POA: Diagnosis not present

## 2021-02-13 DIAGNOSIS — L570 Actinic keratosis: Secondary | ICD-10-CM

## 2021-02-13 DIAGNOSIS — L820 Inflamed seborrheic keratosis: Secondary | ICD-10-CM

## 2021-02-13 DIAGNOSIS — D692 Other nonthrombocytopenic purpura: Secondary | ICD-10-CM | POA: Diagnosis not present

## 2021-02-13 DIAGNOSIS — Z1283 Encounter for screening for malignant neoplasm of skin: Secondary | ICD-10-CM

## 2021-02-13 DIAGNOSIS — L821 Other seborrheic keratosis: Secondary | ICD-10-CM | POA: Diagnosis not present

## 2021-02-13 MED ORDER — CLOBETASOL PROPIONATE 0.05 % EX OINT: 1.0000 "application " | TOPICAL_OINTMENT | Freq: Two times a day (BID) | CUTANEOUS | 6 refills | Status: AC

## 2021-02-13 MED ORDER — TOLAK 4 % EX CREA
TOPICAL_CREAM | CUTANEOUS | 1 refills | Status: AC
Start: 2021-02-13 — End: ?

## 2021-02-13 NOTE — Patient Instructions (Addendum)
Otc bruise cream dermend

## 2021-02-14 ENCOUNTER — Encounter: Payer: Self-pay | Admitting: Dermatology

## 2021-02-14 NOTE — Progress Notes (Signed)
° °  Follow-Up Visit   Subjective  [name] is a 71 y.o. female who presents for the following: Annual Exam (Lesion on right upper thigh x years, bleeding. Lesion on right arm, x 2 weeks itching no bleeding. Lesion on back x months.).  A general skin examination, several areas of concern to patient Location:  Duration:  Quality:  Associated Signs/Symptoms: Modifying Factors:  Severity:  Timing: Context:   Objective  Well appearing patient in no apparent distress; mood and affect are within normal limits. Scalp Full body skin examination: No atypical pigmented lesions or current nonmelanoma skin cancer.  Left Lower Back, Mid Back (4), Right Forearm - Posterior, Right Medial Thigh (3) Multiple noninflamed tan textured flattopped 2 to 4 mm papules  Right Elbow - Posterior, Right Forearm - Posterior Patient has a mixture of actinic keratoses and low-grade eczematous dermatitis on her right arm.  Right Wrist - Posterior Ecchymoses forearms, denies other abnormal bleeding  Right Forearm - Posterior Gritty 6 mm pink crust multiple small pink crusts.  We will initially use topical clobetasol daily for 2 weeks to clear up any inflammatory process and then will alternate Tolak with the fluorouracil for a total of 28 applications.  Left Upper Back Itchy inflamed 8 mm keratosis    A full examination was performed including scalp, head, eyes, ears, nose, lips, neck, chest, axillae, abdomen, back, buttocks, bilateral upper extremities, bilateral lower extremities, hands, feet, fingers, toes, fingernails, and toenails. All findings within normal limits unless otherwise noted below.  Areas beneath undergarments not fully examined   Assessment & Plan    Encounter for screening for malignant neoplasm of skin Scalp  Annual skin examination, encouraged to self examine twice annually.  Seborrheic keratosis (9) Right Forearm - Posterior; Right Medial Thigh (3); Left Lower Back; Mid Back  (4)  No intervention indicated  Dermatitis Right Elbow - Posterior; Right Forearm - Posterior  We will initially use clobetasol topical daily after bathing for 2 weeks; unless there is complete clearing, she will then and topical Tolak every other night.  clobetasol ointment (TEMOVATE) 0.05 % - Right Forearm - Posterior Apply 1 application topically 2 (two) times daily.  Solar purpura ([place]) Right Wrist - Posterior  Otc dermend apply daily after bathing.  Actinic keratosis Right Forearm - Posterior  Topical Tolak.  Fluorouracil (TOLAK) 4 % CREA - Right Forearm - Posterior Aaa qhs 25 to 28 applications  Inflamed seborrheic keratosis Left Upper Back  Destruction of lesion - Left Upper Back Complexity: simple   Destruction method: cryotherapy   Informed consent: discussed and consent obtained   Lesion destroyed using liquid nitrogen: Yes   Cryotherapy cycles:  3 Outcome: patient tolerated procedure well with no complications        I, Lavonna Monarch, MD, have reviewed all documentation for this visit.  The documentation on 02/14/21 for the exam, diagnosis, procedures, and orders are all accurate and complete.

## 2021-02-15 DIAGNOSIS — R7301 Impaired fasting glucose: Secondary | ICD-10-CM | POA: Diagnosis not present

## 2021-02-15 DIAGNOSIS — R7989 Other specified abnormal findings of blood chemistry: Secondary | ICD-10-CM | POA: Diagnosis not present

## 2021-02-15 DIAGNOSIS — E785 Hyperlipidemia, unspecified: Secondary | ICD-10-CM | POA: Diagnosis not present

## 2021-02-24 ENCOUNTER — Other Ambulatory Visit: Payer: Self-pay

## 2021-02-24 MED ORDER — TAMOXIFEN CITRATE 20 MG PO TABS: 20.0000 mg | ORAL_TABLET | Freq: Every day | ORAL | 2 refills | Status: DC

## 2021-03-01 DIAGNOSIS — I87392 Chronic venous hypertension (idiopathic) with other complications of left lower extremity: Secondary | ICD-10-CM | POA: Diagnosis not present

## 2021-03-01 DIAGNOSIS — I872 Venous insufficiency (chronic) (peripheral): Secondary | ICD-10-CM | POA: Diagnosis not present

## 2021-03-01 DIAGNOSIS — R600 Localized edema: Secondary | ICD-10-CM | POA: Diagnosis not present

## 2021-03-08 ENCOUNTER — Other Ambulatory Visit: Payer: Self-pay | Admitting: Obstetrics & Gynecology

## 2021-03-08 DIAGNOSIS — Z1231 Encounter for screening mammogram for malignant neoplasm of breast: Secondary | ICD-10-CM

## 2021-04-10 DIAGNOSIS — Z1152 Encounter for screening for COVID-19: Secondary | ICD-10-CM | POA: Diagnosis not present

## 2021-04-10 DIAGNOSIS — R509 Fever, unspecified: Secondary | ICD-10-CM | POA: Diagnosis not present

## 2021-04-10 DIAGNOSIS — R062 Wheezing: Secondary | ICD-10-CM | POA: Diagnosis not present

## 2021-04-10 DIAGNOSIS — J069 Acute upper respiratory infection, unspecified: Secondary | ICD-10-CM | POA: Diagnosis not present

## 2021-04-10 DIAGNOSIS — R5383 Other fatigue: Secondary | ICD-10-CM | POA: Diagnosis not present

## 2021-04-10 DIAGNOSIS — J45909 Unspecified asthma, uncomplicated: Secondary | ICD-10-CM | POA: Diagnosis not present

## 2021-04-10 DIAGNOSIS — R051 Acute cough: Secondary | ICD-10-CM | POA: Diagnosis not present

## 2021-04-18 DIAGNOSIS — Z1152 Encounter for screening for COVID-19: Secondary | ICD-10-CM | POA: Diagnosis not present

## 2021-04-18 DIAGNOSIS — R0602 Shortness of breath: Secondary | ICD-10-CM | POA: Diagnosis not present

## 2021-04-18 DIAGNOSIS — R0981 Nasal congestion: Secondary | ICD-10-CM | POA: Diagnosis not present

## 2021-04-18 DIAGNOSIS — J45909 Unspecified asthma, uncomplicated: Secondary | ICD-10-CM | POA: Diagnosis not present

## 2021-04-18 DIAGNOSIS — R051 Acute cough: Secondary | ICD-10-CM | POA: Diagnosis not present

## 2021-04-18 DIAGNOSIS — R5383 Other fatigue: Secondary | ICD-10-CM | POA: Diagnosis not present

## 2021-04-18 DIAGNOSIS — J069 Acute upper respiratory infection, unspecified: Secondary | ICD-10-CM | POA: Diagnosis not present

## 2021-04-24 DIAGNOSIS — I868 Varicose veins of other specified sites: Secondary | ICD-10-CM | POA: Diagnosis not present

## 2021-04-24 DIAGNOSIS — D649 Anemia, unspecified: Secondary | ICD-10-CM | POA: Diagnosis not present

## 2021-04-24 DIAGNOSIS — Z23 Encounter for immunization: Secondary | ICD-10-CM | POA: Diagnosis not present

## 2021-04-24 DIAGNOSIS — N6099 Unspecified benign mammary dysplasia of unspecified breast: Secondary | ICD-10-CM | POA: Diagnosis not present

## 2021-04-24 DIAGNOSIS — Z1339 Encounter for screening examination for other mental health and behavioral disorders: Secondary | ICD-10-CM | POA: Diagnosis not present

## 2021-04-24 DIAGNOSIS — R000 Tachycardia, unspecified: Secondary | ICD-10-CM | POA: Diagnosis not present

## 2021-04-24 DIAGNOSIS — K219 Gastro-esophageal reflux disease without esophagitis: Secondary | ICD-10-CM | POA: Diagnosis not present

## 2021-04-24 DIAGNOSIS — R82998 Other abnormal findings in urine: Secondary | ICD-10-CM | POA: Diagnosis not present

## 2021-04-24 DIAGNOSIS — N1831 Chronic kidney disease, stage 3a: Secondary | ICD-10-CM | POA: Diagnosis not present

## 2021-04-24 DIAGNOSIS — Z0000 Encounter for general adult medical examination without abnormal findings: Secondary | ICD-10-CM | POA: Diagnosis not present

## 2021-04-24 DIAGNOSIS — Z1212 Encounter for screening for malignant neoplasm of rectum: Secondary | ICD-10-CM | POA: Diagnosis not present

## 2021-04-24 DIAGNOSIS — Z1331 Encounter for screening for depression: Secondary | ICD-10-CM | POA: Diagnosis not present

## 2021-04-25 ENCOUNTER — Ambulatory Visit
Admission: RE | Admit: 2021-04-25 | Discharge: 2021-04-25 | Disposition: A | Discharge status: Home / Self Care | Payer: Medicare PPO | Source: Ambulatory Visit | Attending: Obstetrics & Gynecology | Admitting: Obstetrics & Gynecology

## 2021-04-25 DIAGNOSIS — Z1231 Encounter for screening mammogram for malignant neoplasm of breast: Secondary | ICD-10-CM

## 2021-04-25 IMAGING — MG MM DIGITAL SCREENING BILAT W/ TOMO AND CAD
6 of 12 series · 6 of 36 positions shown · non-contrast
Comparison: Previous exam(s).

CLINICAL DATA: Screening.

EXAM:
DIGITAL SCREENING BILATERAL MAMMOGRAM WITH TOMOSYNTHESIS AND CAD
TECHNIQUE: Bilateral screening digital craniocaudal and mediolateral oblique
mammograms were obtained. Bilateral screening digital breast
tomosynthesis was performed. The images were evaluated with
computer-aided detection.

[R MLO synth-2D (1 of 2)]
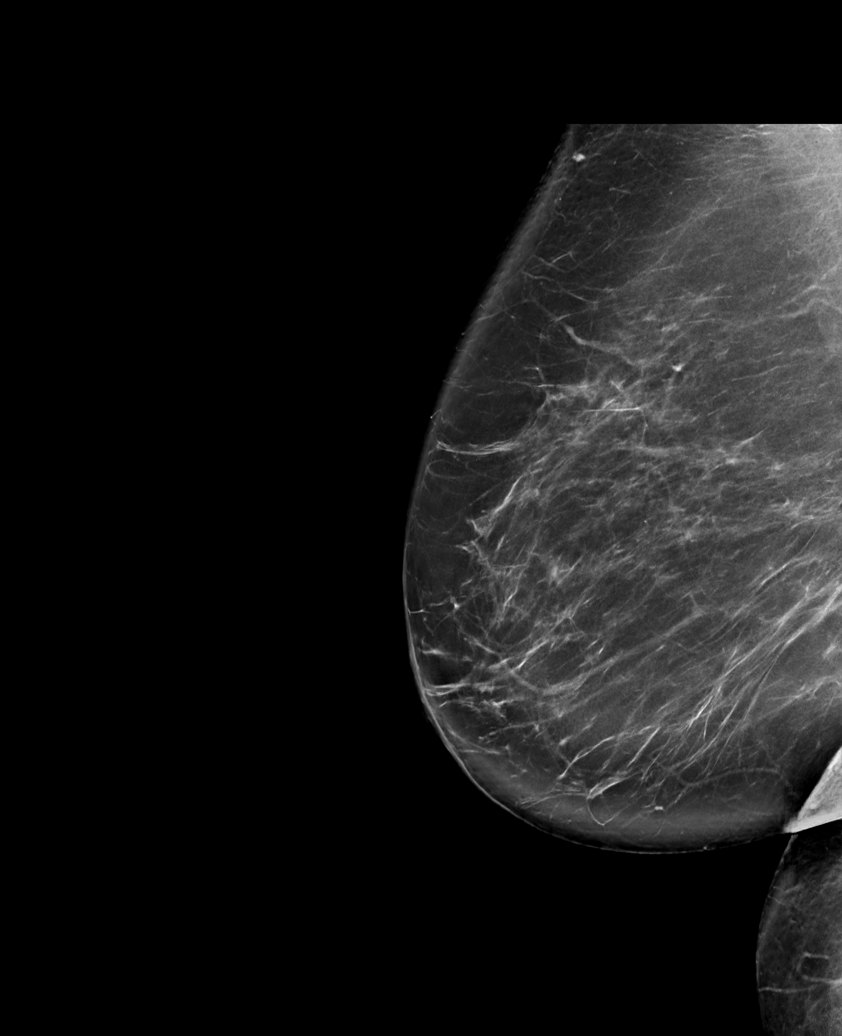

[L CC synth-2D]
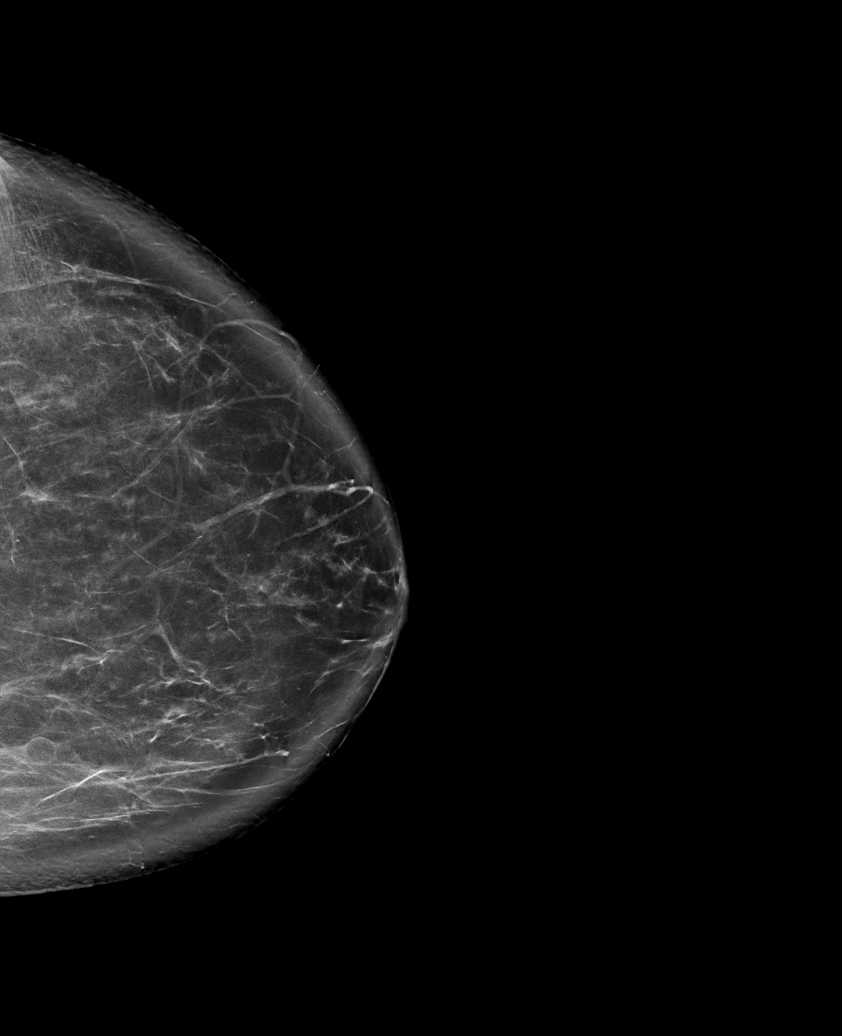

[R MLO synth-2D (2 of 2)]
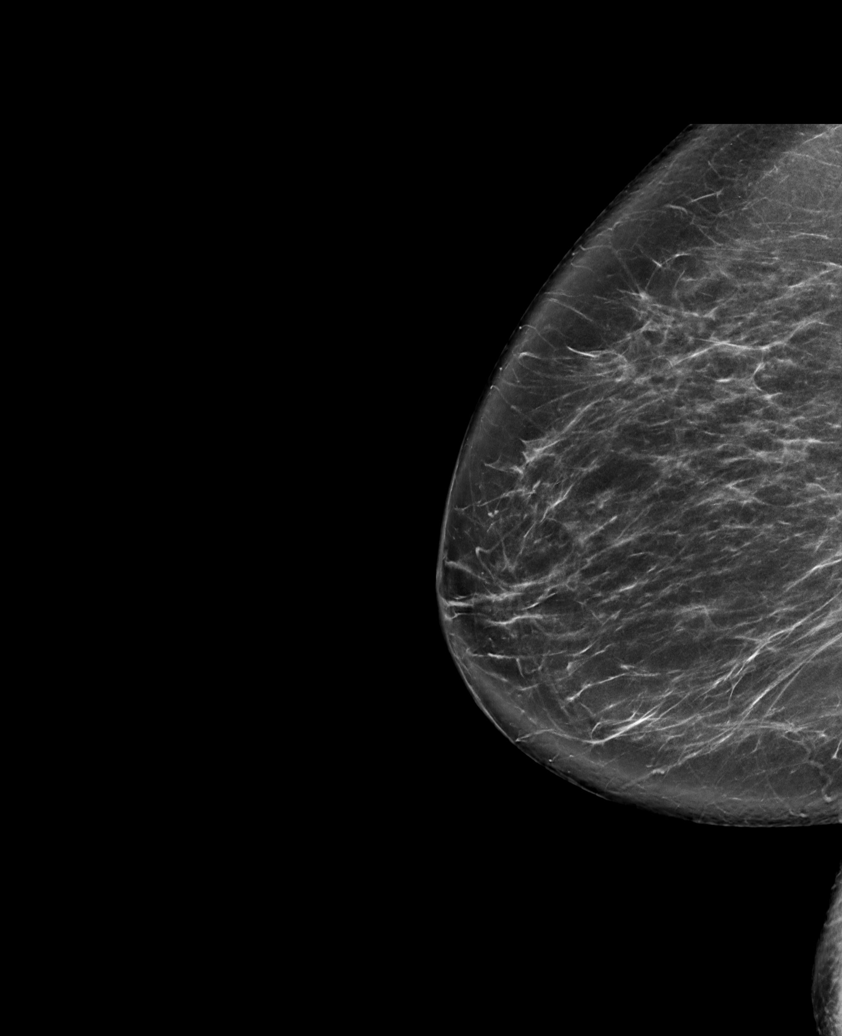

[L MLO synth-2D (1 of 2)]
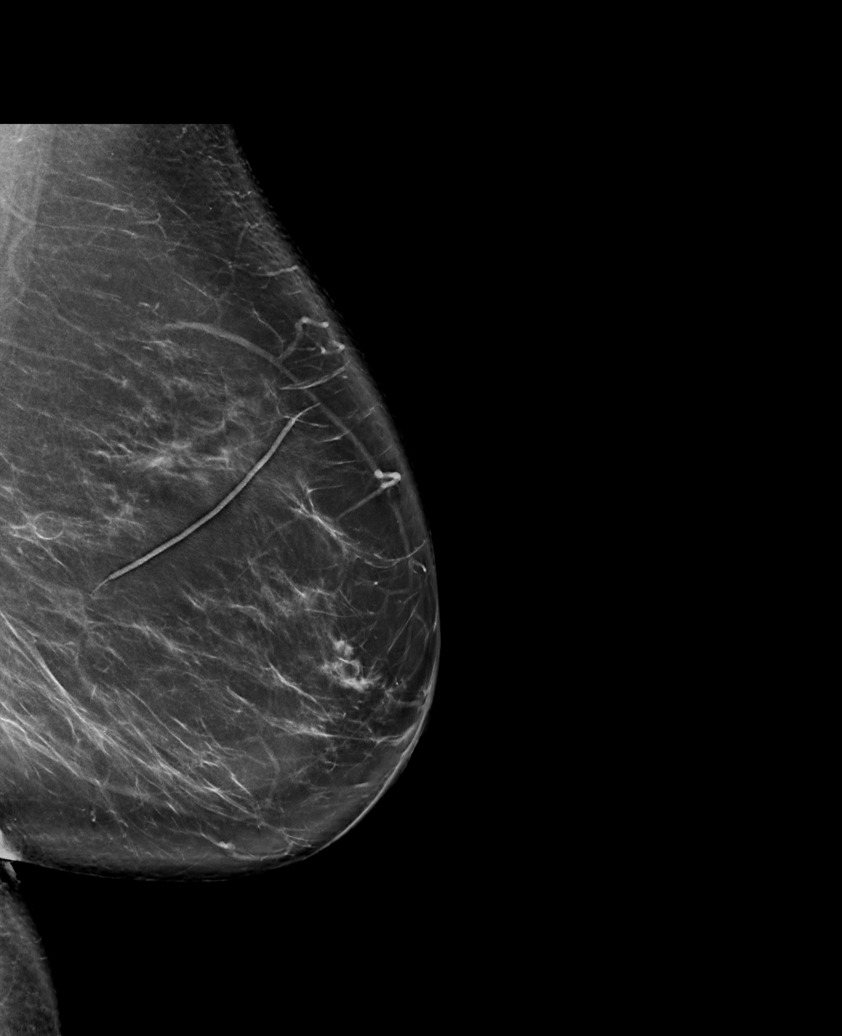

[L MLO synth-2D (2 of 2)]
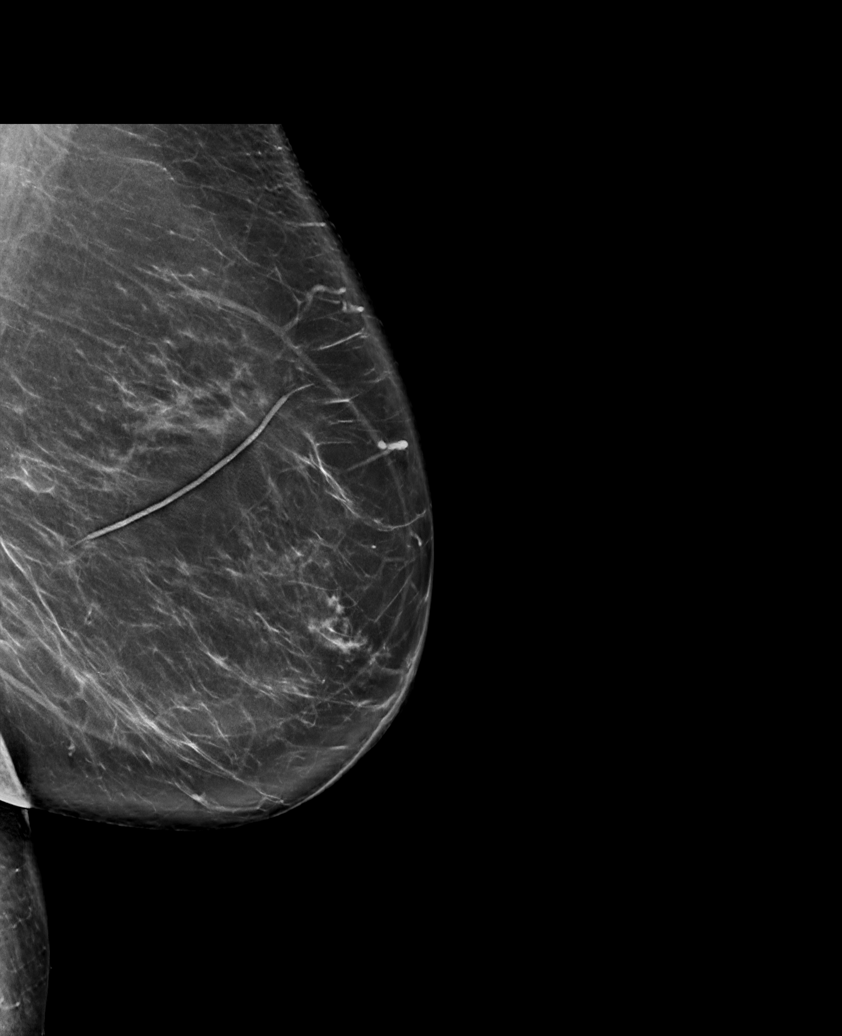

[R CC synth-2D]
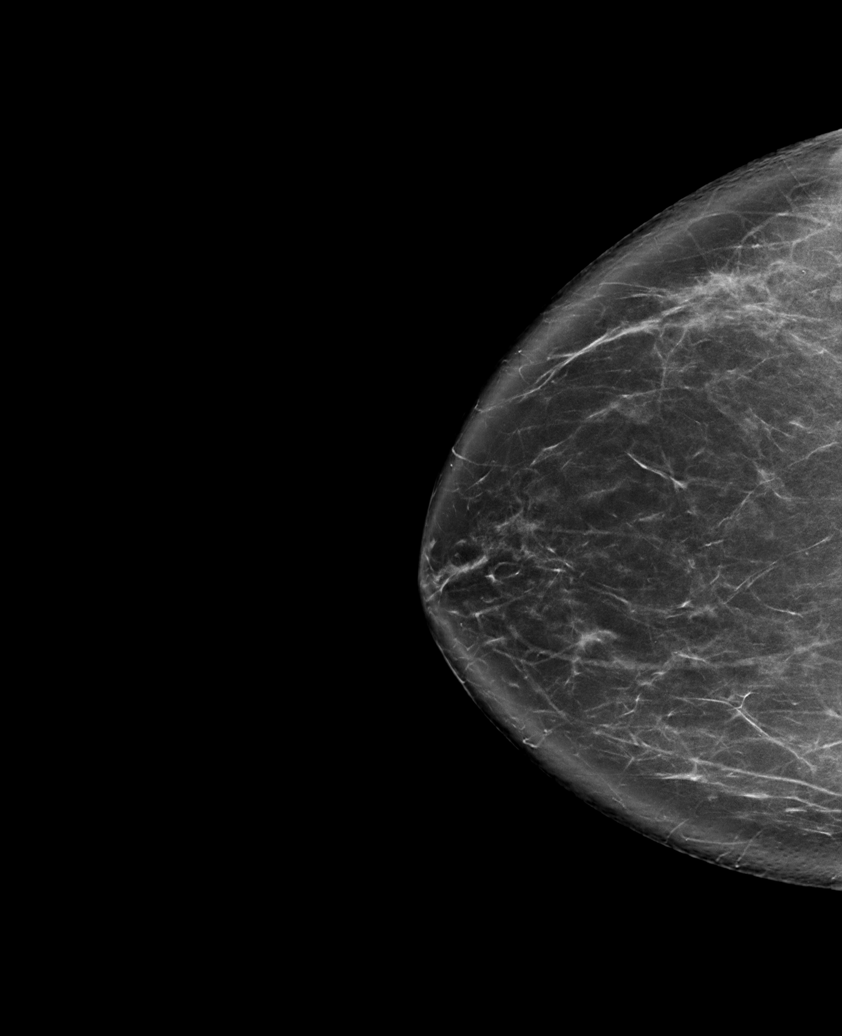

[6 of 36 positions shown; findings below may reference images not displayed]

ACR Breast Density Category b: There are scattered areas of
fibroglandular density.
FINDINGS: There are no findings suspicious for malignancy.
IMPRESSION: No mammographic evidence of malignancy. A result letter of this
screening mammogram will be mailed directly to the patient.

RECOMMENDATION:
Screening mammogram in one year. (Code:[BY])

BI-RADS CATEGORY  1: Negative.

## 2021-05-08 DIAGNOSIS — I83812 Varicose veins of left lower extremities with pain: Secondary | ICD-10-CM | POA: Diagnosis not present

## 2021-05-08 DIAGNOSIS — I83892 Varicose veins of left lower extremities with other complications: Secondary | ICD-10-CM | POA: Diagnosis not present

## 2021-05-08 DIAGNOSIS — M7989 Other specified soft tissue disorders: Secondary | ICD-10-CM | POA: Diagnosis not present

## 2021-05-25 DIAGNOSIS — N760 Acute vaginitis: Secondary | ICD-10-CM | POA: Diagnosis not present

## 2021-07-11 DIAGNOSIS — I872 Venous insufficiency (chronic) (peripheral): Secondary | ICD-10-CM | POA: Diagnosis not present

## 2021-07-11 DIAGNOSIS — I87393 Chronic venous hypertension (idiopathic) with other complications of bilateral lower extremity: Secondary | ICD-10-CM | POA: Diagnosis not present

## 2021-07-11 DIAGNOSIS — R600 Localized edema: Secondary | ICD-10-CM | POA: Diagnosis not present

## 2021-08-02 ENCOUNTER — Other Ambulatory Visit: Payer: Self-pay | Admitting: Dermatology

## 2021-08-07 ENCOUNTER — Telehealth: Payer: Self-pay | Admitting: *Deleted

## 2021-08-07 MED ORDER — DESONIDE 0.05 % EX OINT: TOPICAL_OINTMENT | CUTANEOUS | 9 refills | Status: AC

## 2021-08-07 NOTE — Telephone Encounter (Signed)
Patient calling needing refill on desonide ointment- sent to patient pharmacy.

## 2021-08-21 DIAGNOSIS — M25561 Pain in right knee: Secondary | ICD-10-CM | POA: Diagnosis not present

## 2021-08-23 DIAGNOSIS — E785 Hyperlipidemia, unspecified: Secondary | ICD-10-CM | POA: Diagnosis not present

## 2021-08-23 DIAGNOSIS — R7989 Other specified abnormal findings of blood chemistry: Secondary | ICD-10-CM | POA: Diagnosis not present

## 2021-08-28 DIAGNOSIS — N183 Chronic kidney disease, stage 3 unspecified: Secondary | ICD-10-CM | POA: Diagnosis not present

## 2021-09-19 DIAGNOSIS — N760 Acute vaginitis: Secondary | ICD-10-CM | POA: Diagnosis not present

## 2021-10-05 DIAGNOSIS — N762 Acute vulvitis: Secondary | ICD-10-CM | POA: Diagnosis not present

## 2021-10-06 ENCOUNTER — Other Ambulatory Visit: Payer: Self-pay | Admitting: *Deleted

## 2021-10-06 DIAGNOSIS — N6092 Unspecified benign mammary dysplasia of left breast: Secondary | ICD-10-CM

## 2021-10-06 DIAGNOSIS — Z1239 Encounter for other screening for malignant neoplasm of breast: Secondary | ICD-10-CM

## 2021-10-06 DIAGNOSIS — Z9189 Other specified personal risk factors, not elsewhere classified: Secondary | ICD-10-CM

## 2021-10-23 ENCOUNTER — Ambulatory Visit
Admission: RE | Admit: 2021-10-23 | Discharge: 2021-10-23 | Disposition: A | Discharge status: Home / Self Care | Payer: Medicare PPO | Source: Ambulatory Visit | Attending: Hematology and Oncology | Admitting: Hematology and Oncology

## 2021-10-23 DIAGNOSIS — Z1239 Encounter for other screening for malignant neoplasm of breast: Secondary | ICD-10-CM

## 2021-10-23 DIAGNOSIS — Z9189 Other specified personal risk factors, not elsewhere classified: Secondary | ICD-10-CM

## 2021-10-23 DIAGNOSIS — N6092 Unspecified benign mammary dysplasia of left breast: Secondary | ICD-10-CM

## 2021-10-23 MED ORDER — GADOPICLENOL 0.5 MMOL/ML IV SOLN
7.5000 mL | Freq: Once | INTRAVENOUS | Status: AC | PRN
  Administered 2021-10-23: 7.5 mL via INTRAVENOUS

## 2021-10-31 ENCOUNTER — Other Ambulatory Visit: Payer: Self-pay

## 2021-10-31 ENCOUNTER — Encounter: Payer: Self-pay | Admitting: Hematology and Oncology

## 2021-10-31 ENCOUNTER — Inpatient Hospital Stay: Payer: Medicare PPO | Attending: Hematology and Oncology | Admitting: Hematology and Oncology

## 2021-10-31 VITALS — BP 139/66 | HR 89 | Temp 97.9°F | Resp 16 | Ht 64.0 in | Wt 157.7 lb

## 2021-10-31 DIAGNOSIS — N6092 Unspecified benign mammary dysplasia of left breast: Secondary | ICD-10-CM | POA: Insufficient documentation

## 2021-10-31 DIAGNOSIS — Z79810 Long term (current) use of selective estrogen receptor modulators (SERMs): Secondary | ICD-10-CM | POA: Insufficient documentation

## 2021-10-31 DIAGNOSIS — Z808 Family history of malignant neoplasm of other organs or systems: Secondary | ICD-10-CM | POA: Insufficient documentation

## 2021-10-31 DIAGNOSIS — Z803 Family history of malignant neoplasm of breast: Secondary | ICD-10-CM | POA: Diagnosis not present

## 2021-10-31 NOTE — Progress Notes (Signed)
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  CURRENT TREATMENT: tamoxifen; intensified screening   INTERVAL HISTORY: Pam returns today for follow up of her high risk for breast cancer. Since last visit, she continues on tamoxifen for breast cancer prevention.  She also does not have intensified screening with MRI alternating with mammograms.  She had an MRI which did not show any evidence of malignancy.  She would like to continue intensified screening at this time.  She denies any changes in her breast.   COVID 19 VACCINATION STATUS: Pfizer x3 as of October 2022   HISTORY OF CURRENT ILLNESS:  From the original intake note:  "Pam" had routine screening mammography at Dr. Ver[facility] clinic showing a possible assymetry in the left breast. She underwent unilateral left diagnostic mammography with tomography and left breast ultrasonography at [facility] on 03/11/2018 showing: Breast Density Category B. Spot compression tomosynthesis images through the region of concern in the superior left breast demonstrates a persistent asymmetry measuring approximately 0.9 cm. There is no associated distortion or suspicious calcifications. Ultrasound of the left breast at 11 o'clock, 7 cm from the nipple demonstrates an irregular hypoechoic mass measuring 0.6 x 0.4 x 0.5 cm. Ultrasound of the left axilla demonstrates  multiple normal-appearing lymph nodes.  Accordingly on 03/12/2018 she proceeded to biopsy of the left breast area in question. The pathology from this procedure showed ([id number]): Flat epithelial atypia with background columnar cell hyperplasia, 11 o'clock.  This was felt to be discordant.  She underwent a left lumpectomy and a laparoscopic cholecystectomy on 05/30/2018. The pathology from this procedure showed ([id number]): 1. Breast, lumpectomy, left with radioactive seed - Biopsy site changes. Columnar cell hyperplasia. Flat epithelial atypia. Lobular neoplasia (atypical lobular hyperplasia) 2. Gallbladder   - Chronic cholecystitis with cholelithiasis  The patient's subsequent history is as detailed below.   PAST MEDICAL HISTORY: Past Medical History:  Diagnosis Date   Allergy    Anemia    Asthma    Depression    GERD (gastroesophageal reflux disease)    Headache    Palpitations 2016   Cardiolite neg 2005, echo 2011 EF 55%  Gastroparesis possibly secondary to remote pyloric stenosis correction   PAST SURGICAL HISTORY: Past Surgical History:  Procedure Laterality Date   ABDOMINAL HYSTERECTOMY     BREAST BIOPSY Left    BREAST LUMPECTOMY Left 05/2018   abnormal cells   BREAST LUMPECTOMY WITH RADIOACTIVE SEED LOCALIZATION Left 05/30/2018   Procedure: LEFT BREAST LUMPECTOMY WITH RADIOACTIVE SEED LOCALIZATION;  Surgeon: Jovita Kussmaul, MD;  Location: [facility];  Service: General;  Laterality: Left;   BUNIONECTOMY     CESAREAN SECTION     CHOLECYSTECTOMY N/A 05/30/2018   Procedure: LAPAROSCOPIC CHOLECYSTECTOMY WITH INTRAOPERATIVE CHOLANGIOGRAM;  Surgeon: Jovita Kussmaul, MD;  Location: [facility];  Service: General;  Laterality: N/A;   pyloric stenosis     at 5 weeks old   [facility]    Bilateral salpingo-oophorectomy  FAMILY HISTORY: Family History  Problem Relation Age of Onset   Hypertension Mother    Cancer Mother         Uterine and Breast   Cerebral aneurysm Mother    Breast cancer Mother    Arthritis Father    Hypertension Father    Glaucoma Father   Pam's father died from Alzheimer's disease at age 51.  He had a history of bladder cancer.  Patients' mother is living at the age of 71 as of August 2020.   She was diagnosed with breast cancer in her 56s and also uterine cancer. The patient has 1 sister, no brothers. Patient denies anyone in her family having ovarian, prostate, or pancreatic cancer.    GYNECOLOGIC HISTORY:  No LMP recorded. Patient has had a hysterectomy. 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 Her husband, Quita Skye, is a Engineer, maintenance (IT). Her son, Simona Huh, lives in [place], is an Optometrist, is married and has 2 children. Her daughter, Lysbeth Galas, is an Scientist, physiological that works with farmer education near [place].  She has 1 child and 1 on the way. Pam belongs to a [employer].    ADVANCED DIRECTIVES: In the absence of any documentation, Pam's spouse, Quita Skye, is automatically her healthcare power of attorney.      HEALTH MAINTENANCE: Social History   Tobacco Use   Smoking status: Never   Smokeless tobacco: Never  Vaping Use   Vaping Use: Never used  Substance Use Topics   Alcohol use: No   Drug use: No    Colonoscopy: 2017/[facility]  PAP: Status post hysterectomy  Bone density: Due in 09/2018 Mammography: Up to date  Allergies  Allergen Reactions   Fish Allergy Hives   Metoclopramide Hcl Other (See Comments)    Facial tightness    Penicillins Hives    Did it involve swelling of the face/tongue/throat, SOB, or low BP? No Did it involve sudden or severe rash/hives, skin peeling, or any reaction on the inside of your mouth or nose? No Did you need to seek medical attention at a hospital or doctor's office?  No When did it last happen? ~40 years ago If all above answers are "NO", may proceed with cephalosporin use.    Shellfish Allergy Hives   Sulfamethoxazole-Trimethoprim Hives   Ciprofloxacin Palpitations   Nickel Rash    Current Outpatient Medications  Medication Sig Dispense Refill   acetaminophen (TYLENOL) 500 MG tablet Take 1,000 mg by mouth every 6 (six) hours as needed (pain.).     albuterol (PROAIR HFA) 108 (90 BASE) MCG/ACT inhaler Inhale 1-2 puffs into the lungs every 4 (four) hours as needed. (Patient taking differently: Inhale 1-2 puffs into the lungs every 4 (four) hours as needed (asthma/wheezing/shortness of breath.).) 18 g 3   Biotin 5000 MCG TABS Take 5,000 mcg by mouth daily.     Cholecalciferol (VITAMIN D-3) 125 MCG (5000 UT) TABS Take 5,000 Units by mouth at bedtime.     Cinnamon 500 MG capsule Take 500 mg by mouth every evening.      clobetasol (TEMOVATE) 0.05 % external solution APPLY TOPICALLY TO THE AFFECTED AREA AT BEDTIME 50 mL 4   clobetasol ointment (TEMOVATE) 1.61 % Apply 1 application topically 2 (two) times daily. 30 g 6   conjugated estrogens (PREMARIN) vaginal cream Place 1  Applicatorful vaginally 2 (two) times a week. Mondays & Thursdays.     Dapsone 7.5 % GEL APPLY TO FACE EVERY NIGHT AT BEDTIME AS DIRECTED 90 g 1   desonide (DESOWEN) 0.05 % ointment desonide 0.05 % topical ointment 60 g 9   famotidine (PEPCID) 20 MG tablet Take 20 mg by mouth 2 (two) times daily.     fexofenadine (ALLEGRA) 180 MG tablet Take 180 mg by mouth at bedtime.     Flaxseed, Linseed, (FLAX SEED OIL) 1000 MG CAPS Take 1,000 mg by mouth at bedtime.     Fluorouracil (TOLAK) 4 % CREA Aaa qhs 25 to 28 applications 40 g 1   hydrochlorothiazide (HYDRODIURIL) 25 MG tablet Take 25 mg by mouth daily as needed (fluid retention.).     magnesium oxide (MAG-OX) 400 MG tablet Take 400 mg by mouth every evening.      montelukast (SINGULAIR) 10 MG tablet take 1 tablet by mouth once daily (Patient  taking differently: Take 10 mg by mouth at bedtime.) 90 tablet 3   Multiple Vitamins-Minerals (MULTIVITAMIN ADULTS 50+ PO) Take by mouth daily.     nystatin-triamcinolone ointment (MYCOLOG) nystatin-triamcinolone 100,000 unit/gram-0.1 % topical ointment     polycarbophil (FIBERCON) 625 MG tablet Take 625 mg by mouth daily.     Probiotic Product (PROBIOTIC PO) Take 1 capsule by mouth daily.     SPIRIVA HANDIHALER 18 MCG inhalation capsule inhale THE CONTENTS OF ONE CAPSULE IN THE HANDIHALER once daily (Patient taking differently: Place 18 mcg into inhaler and inhale daily.) 30 capsule 0   SYSTANE 0.4-0.3 % SOLN Place 1 drop into both eyes 4 (four) times daily as needed (dry/irritated eyes.).     tamoxifen (NOLVADEX) 20 MG tablet Take 1 tablet (20 mg total) by mouth daily. 90 tablet 2   TURMERIC PO Take 1,000 mg by mouth at bedtime.     verapamil (VERELAN PM) 180 MG 24 hr capsule Take 180 mg by mouth at bedtime.     vitamin B-12 (CYANOCOBALAMIN) 1000 MCG tablet Take 1,000 mcg by mouth at bedtime.      Current Facility-Administered Medications  Medication Dose Route Frequency Provider Last Rate Last Admin   ipratropium-albuterol (DUONEB) 0.5-2.5 (3) MG/3ML nebulizer solution 3 mL  3 mL Nebulization Once Kelby Aline, PA-C        OBJECTIVE: White woman who appears younger than stated age  26:   10/31/21 0945  BP: 139/66  Pulse: 89  Resp: 16  Temp: 97.9 F (36.6 C)  SpO2: 98%      Body mass index is 27.07 kg/m.   Wt Readings from Last 3 Encounters:  10/31/21 157 lb 11.2 oz (71.5 kg)  10/31/20 159 lb 14.4 oz (72.5 kg)  10/06/19 161 lb (73 kg)      ECOG FS:1 - Symptomatic but completely ambulatory  Physical Exam Constitutional:      Appearance: Normal appearance.  Chest:     Comments: Bilateral breasts inspected.  No palpable masses or regional adenopathy Musculoskeletal:     Cervical back: Normal range of motion and neck supple. No rigidity.  Lymphadenopathy:      Cervical: No cervical adenopathy.  Skin:    General: Skin is warm and dry.  Neurological:     General: No focal deficit present.     Mental Status: She is alert.     LAB RESULTS:  CMP     Component Value Date/Time   NA 135 05/22/2018 1042   K 4.0 05/22/2018 1042  CL 99 05/22/2018 1042   CO2 29 05/22/2018 1042   GLUCOSE 96 05/22/2018 1042   BUN 11 05/22/2018 1042   CREATININE 0.95 05/22/2018 1042   CREATININE 0.90 02/25/2015 1042   CALCIUM 9.4 05/22/2018 1042   PROT 7.2 05/22/2018 1042   ALBUMIN 3.8 05/22/2018 1042   AST 22 05/22/2018 1042   ALT 16 05/22/2018 1042   ALKPHOS 63 05/22/2018 1042   BILITOT 0.5 05/22/2018 1042   GFRNONAA >60 05/22/2018 1042   GFRNONAA 68 02/25/2015 1042   GFRAA >60 05/22/2018 1042   GFRAA 78 02/25/2015 1042    No results found for: "TOTALPROTELP", "ALBUMINELP", "A1GS", "A2GS", "BETS", "BETA2SER", "GAMS", "MSPIKE", "SPEI"  No results found for: "KPAFRELGTCHN", "LAMBDASER", "KAPLAMBRATIO"  Lab Results  Component Value Date   WBC 7.9 05/22/2018   NEUTROABS 2.6 02/25/2015   HGB 12.1 05/22/2018   HCT 37.3 05/22/2018   MCV 90.8 05/22/2018   PLT 361 05/22/2018    No results found for: "LABCA2"  No components found for: "UVOZDG644"  No results for input(s): "INR" in the last 168 hours.  No results found for: "LABCA2"  No results found for: "IHK742"  No results found for: "CAN125"  No results found for: "CAN153"  No results found for: "CA2729"  No components found for: "HGQUANT"  No results found for: "CEA1", "CEA" / No results found for: "CEA1", "CEA"   No results found for: "AFPTUMOR"  No results found for: "CHROMOGRNA"  No results found for: "TOTALPROTELP", "ALBUMINELP", "A1GS", "A2GS", "BETS", "BETA2SER", "GAMS", "MSPIKE", "SPEI" (this displays SPEP labs)  No results found for: "KPAFRELGTCHN", "LAMBDASER", "KAPLAMBRATIO" (kappa/lambda light chains)  No results found for: "HGBA", "HGBA2QUANT", "HGBFQUANT",  "HGBSQUAN" (Hemoglobinopathy evaluation)   No results found for: "LDH"  Lab Results  Component Value Date   IRON 81 08/17/2014   TIBC 341 08/17/2014   IRONPCTSAT 24 08/17/2014   (Iron and TIBC)  Lab Results  Component Value Date   FERRITIN 21 08/17/2014    Urinalysis    Component Value Date/Time   COLORURINE YELLOW 09/20/2014 1143   APPEARANCEUR CLEAR 09/20/2014 1143   LABSPEC 1.019 09/20/2014 1143   PHURINE 6.5 09/20/2014 1143   GLUCOSEU NEGATIVE 09/20/2014 1143   HGBUR NEGATIVE 09/20/2014 1143   BILIRUBINUR negative 02/12/2015 1457   KETONESUR negative 02/12/2015 1457   KETONESUR NEGATIVE 09/20/2014 1143   PROTEINUR negative 02/12/2015 1457   PROTEINUR NEGATIVE 09/20/2014 1143   UROBILINOGEN 0.2 02/12/2015 1457   UROBILINOGEN 0.2 05/11/2013 1029   NITRITE Negative 02/12/2015 1457   NITRITE NEGATIVE 09/20/2014 1143   LEUKOCYTESUR small (1+) (A) 02/12/2015 1457    STUDIES:  MR BREAST BILATERAL W WO CONTRAST INC CAD  Result Date: 10/23/2021 CLINICAL DATA:  71 year old female for high risk screening breast MRI. History of LEFT breast lobular neoplasia and excision in 2020. EXAM: BILATERAL BREAST MRI WITH AND WITHOUT CONTRAST TECHNIQUE: Multiplanar, multisequence MR images of both breasts were obtained prior to and following the intravenous administration of 7.5 ml of Vueway Three-dimensional MR images were rendered by post-processing of the original MR data on an independent workstation. The three-dimensional MR images were interpreted, and findings are reported in the following complete MRI report for this study. Three dimensional images were evaluated at the independent interpreting workstation using the DynaCAD thin client. COMPARISON:  10/05/2020 MR, 04/25/2021 mammogram and prior studies FINDINGS: Breast composition: a. Almost entirely fat. Background parenchymal enhancement: Minimal Right breast: No mass or abnormal enhancement. Left breast: No mass or abnormal  enhancement. Surgical changes again  noted. Lymph nodes: No abnormal appearing lymph nodes. Ancillary findings:  None. IMPRESSION: No MR evidence of breast malignancy. RECOMMENDATION: Bilateral screening breast MRI in 1 year as clinically indicated. Bilateral screening mammogram in April 2024. BI-RADS CATEGORY  2: Benign. Electronically Signed   By: Margarette Canada M.D.   On: 10/23/2021 14:19     ELIGIBLE FOR AVAILABLE RESEARCH PROTOCOL: no   ASSESSMENT: 71 y.o. [place], [place] woman status post left lumpectomy 05/30/2018 showing atypical lobular hyperplasia --lifetime breast cancer risk 20-25%  (1) risk reduction: Tamoxifen started 08/04/2018  (2) intensified screening:  (a) breast MRI yearly  (b) mammography 6 months apart from breast MRI  (c) biannual breast exam   PLAN:  Patient arrived for follow-up today.  She has been taking tamoxifen as prescribed.  She would like to continue intensified screening.  We have once again discussed about unknown long-term risk of gadolinium deposition and increased to biopsies with the MRIs.  No concerning findings on physical examination today.  She was encouraged to continue self breast exam and return to clinic once a year or sooner as needed. Mammogram ordered for next year Total encounter time: 20 minutes *Total Encounter Time as defined by the Centers for Medicare and Medicaid Services includes, in addition to the face-to-face time of a patient visit (documented in the note above) non-face-to-face time: obtaining and reviewing outside history, ordering and reviewing medications, tests or procedures, care coordination (communications with other health care professionals or caregivers) and documentation in the medical record.

## 2021-11-01 DIAGNOSIS — Z01419 Encounter for gynecological examination (general) (routine) without abnormal findings: Secondary | ICD-10-CM | POA: Diagnosis not present

## 2021-11-01 DIAGNOSIS — R319 Hematuria, unspecified: Secondary | ICD-10-CM | POA: Diagnosis not present

## 2021-11-01 DIAGNOSIS — N952 Postmenopausal atrophic vaginitis: Secondary | ICD-10-CM | POA: Diagnosis not present

## 2021-11-01 DIAGNOSIS — Z6827 Body mass index (BMI) 27.0-27.9, adult: Secondary | ICD-10-CM | POA: Diagnosis not present

## 2021-11-06 DIAGNOSIS — D3132 Benign neoplasm of left choroid: Secondary | ICD-10-CM | POA: Diagnosis not present

## 2021-11-06 DIAGNOSIS — H04123 Dry eye syndrome of bilateral lacrimal glands: Secondary | ICD-10-CM | POA: Diagnosis not present

## 2021-11-09 DIAGNOSIS — M25561 Pain in right knee: Secondary | ICD-10-CM | POA: Diagnosis not present

## 2021-12-15 ENCOUNTER — Other Ambulatory Visit: Payer: Self-pay | Admitting: *Deleted

## 2021-12-15 MED ORDER — TAMOXIFEN CITRATE 20 MG PO TABS: 20.0000 mg | ORAL_TABLET | Freq: Every day | ORAL | 2 refills | Status: DC

## 2022-01-25 DIAGNOSIS — R000 Tachycardia, unspecified: Secondary | ICD-10-CM | POA: Diagnosis not present

## 2022-01-25 DIAGNOSIS — M21611 Bunion of right foot: Secondary | ICD-10-CM | POA: Diagnosis not present

## 2022-01-25 DIAGNOSIS — J45909 Unspecified asthma, uncomplicated: Secondary | ICD-10-CM | POA: Diagnosis not present

## 2022-01-25 DIAGNOSIS — D649 Anemia, unspecified: Secondary | ICD-10-CM | POA: Diagnosis not present

## 2022-01-25 DIAGNOSIS — Z01818 Encounter for other preprocedural examination: Secondary | ICD-10-CM | POA: Diagnosis not present

## 2022-01-25 DIAGNOSIS — M2041 Other hammer toe(s) (acquired), right foot: Secondary | ICD-10-CM | POA: Diagnosis not present

## 2022-02-01 DIAGNOSIS — L814 Other melanin hyperpigmentation: Secondary | ICD-10-CM | POA: Diagnosis not present

## 2022-02-01 DIAGNOSIS — L820 Inflamed seborrheic keratosis: Secondary | ICD-10-CM | POA: Diagnosis not present

## 2022-02-01 DIAGNOSIS — L821 Other seborrheic keratosis: Secondary | ICD-10-CM | POA: Diagnosis not present

## 2022-02-13 ENCOUNTER — Ambulatory Visit: Payer: Medicare PPO | Admitting: Dermatology

## 2022-02-20 DIAGNOSIS — M2041 Other hammer toe(s) (acquired), right foot: Secondary | ICD-10-CM | POA: Diagnosis not present

## 2022-02-20 DIAGNOSIS — M79671 Pain in right foot: Secondary | ICD-10-CM | POA: Diagnosis not present

## 2022-02-20 DIAGNOSIS — M2011 Hallux valgus (acquired), right foot: Secondary | ICD-10-CM | POA: Diagnosis not present

## 2022-02-20 NOTE — Patient Instructions (Signed)
[name]  02/20/2022     @PREFPERIOPPHARMACY$ @   Your procedure is scheduled on  02/23/2022.   Report to [facility] at  0600  A.M.   Call this number if you have problems the morning of surgery:  [phone]  If you experience any cold or flu symptoms such as cough, fever, chills, shortness of breath, etc. between now and your scheduled surgery, please notify us at the above number.   Remember:  Do not eat or drink after midnight.        Use your nebulizer and your inhaler before you come and bring your rescue inhaler with you.     Take these medicines the morning of surgery with A SIP OF WATER                              pepcid, tamoxifen.     Do not wear jewelry, make-up or nail polish.  Do not wear lotions, powders, or perfumes, or deodorant.  Do not shave 48 hours prior to surgery.  Men may shave face and neck.  Do not bring valuables to the hospital.  [facility] is not responsible for any belongings or valuables.  Contacts, dentures or bridgework may not be worn into surgery.  Leave your suitcase in the car.  After surgery it may be brought to your room.  For patients admitted to the hospital, discharge time will be determined by your treatment team.  Patients discharged the day of surgery will not be allowed to drive home and must have someone with them for 24 hours.    Special instructions:   DO NOT smoke tobacco or vape for 24 hours before your procedure.  Please read over the following fact sheets that you were given. Coughing and Deep Breathing, Surgical Site Infection Prevention, Anesthesia Post-op Instructions, and Care and Recovery After Surgery      Bunion Surgery, Care After This sheet gives you information about how to care for yourself after your procedure. Your health care provider may also give you more specific instructions. If you have problems or questions, contact your health care provider. What can I expect after the  procedure? After the procedure, it is common to have: Redness. Pain. Swelling. A small amount of fluid coming from your incision. Follow these instructions at home: If you have a post-operative brace, boot, or shoe: Wear the post-op (post-operative) brace, boot, or shoe as told by your health care provider. Remove it only as told by your health care provider. Loosen the brace, boot, or shoe if your toes tingle, become numb, or turn cold and blue. Keep the brace, boot, or shoe clean and dry. If you have a cast: Do not put pressure on any part of the cast until it is fully hardened. This may take several hours. Do not stick anything inside the cast to scratch your skin. Doing that increases your risk of infection. Check the skin around the cast every day. Tell your health care provider about any concerns. You may put lotion on dry skin around the edges of the cast. Do not put lotion on the skin underneath the cast. Keep the cast clean and dry. Bathing Do not take baths, swim, or use a hot tub until your health care provider approves. Ask your health care provider if you may take showers. If your brace, boot, shoe, or cast is not waterproof:  Do not let it get wet. Cover it with a watertight covering when you take a bath or a shower. Keep your bandage (dressing) dry until your health care provider says it can be removed. Incision care  The dressing holds your toe in the correct position. Do not change the dressing until your health care provider approves. Follow instructions from your health care provider about how to take care of your incision. Make sure you: Wash your hands with soap and water for at least 20 seconds before and after you change your dressing. If soap and water are not available, use hand sanitizer. Change your dressing as told by your health care provider. Leave stitches (sutures), skin glue, or adhesive strips in place. These skin closures may need to stay in place for 2  weeks or longer. If adhesive strip edges start to loosen and curl up, you may trim the loose edges. Do not remove adhesive strips completely unless your health care provider tells you to do that. Check your incision area every day for signs of infection. Check for: More redness, swelling, or pain. Blood or more fluid. Warmth. Pus or a bad smell. Managing pain, stiffness, and swelling  If directed, put ice on the affected area. To do this: If you have a removable brace, boot, or shoe, remove it as told by your health care provider. Put ice in a plastic bag. Place a towel between your skin and the bag or between your cast and the bag. Leave the ice on for 20 minutes, 2-3 times a day. Remove the ice if your skin turns bright red. This is very important. If you cannot feel pain, heat, or cold, you have a greater risk of damage to the area. Move your toes often to reduce stiffness and swelling. Raise (elevate) the injured area above the level of your heart while you are sitting or lying down. Activity Return to your normal activities as told by your health care provider. Ask your health care provider what activities are safe for you. If physical therapy was prescribed, do exercises as told by your health care provider. Driving If you were given a sedative during the procedure, it can affect you for several hours. Do not drive or operate machinery until your health care provider says that it is safe. Ask your health care provider when it is safe to drive if you have a brace, boot, shoe, or cast on your foot. Safety Do not use the affected leg to support (bear) your body weight until your health care provider says that you can. Follow weight-bearing restrictions as told. Use crutches, a cane, or a walker as told by your health care provider. General instructions Do not use any products that contain nicotine or tobacco, such as cigarettes, e-cigarettes, and chewing tobacco. If you need help  quitting, ask your health care provider. Take over-the-counter and prescription medicines only as told by your health care provider. Your medicines may cause constipation. To prevent or treat constipation, you may need to: Drink enough fluid to keep your urine pale yellow. Take over-the-counter or prescription medicines. Eat foods that are high in fiber, such as beans, whole grains, and fresh fruits and vegetables. Limit foods that are high in fat and processed sugars, such as fried or sweet foods. Do not wear high heels or tight-fitting shoes, even after you heal. Keep all follow-up visits. This is important. Contact a health care provider if: You have any of these signs of infection: More redness, swelling,  or pain around your incision. Blood or more fluid coming from your incision. Warmth coming from your incision. Pus or a bad smell coming from your incision. A fever or chills. Your dressing gets wet or it falls off. You have swelling in your lower leg or calf. You have numbness or stiffness in your toes. Get help right away if: You have severe pain. You have shortness of breath or trouble breathing. You have chest pain. You have redness, swelling, pain, or warmth in your calf or leg. These symptoms may represent a serious problem that is an emergency. Do not wait to see if the symptoms will go away. Get medical help right away. Call your local emergency services (911 in the [place] Do not drive yourself to the hospital. Summary If directed, put ice on the affected area. Leave the ice on for 20 minutes, 2-3 times a day. Ask your health care provider when it is safe to drive if you have a brace, boot, shoe, or cast on your foot. Do not use the affected leg to support (bear) your body weight until your health care provider says that you can. Follow weight-bearing restrictions as told. Use crutches, a cane, or a walker as told by your health care provider. This information is not  intended to replace advice given to you by your health care provider. Make sure you discuss any questions you have with your health care provider. Document Revised: 04/24/2019 Document Reviewed: 04/24/2019 Elsevier Patient Education  [place] After The following information offers guidance on how to care for yourself after your procedure. Your health care provider may also give you more specific instructions. If you have problems or questions, contact your health care provider. What can I expect after the procedure? After the procedure, it is common to have: Tiredness. Little or no memory about what happened during or after the procedure. Impaired judgment when it comes to making decisions. Nausea or vomiting. Some trouble with balance. Follow these instructions at home: For the time period you were told by your health care provider:  Rest. Do not participate in activities where you could fall or become injured. Do not drive or use machinery. Do not drink alcohol. Do not take sleeping pills or medicines that cause drowsiness. Do not make important decisions or sign legal documents. Do not take care of children on your own. Medicines Take over-the-counter and prescription medicines only as told by your health care provider. If you were prescribed antibiotics, take them as told by your health care provider. Do not stop using the antibiotic even if you start to feel better. Eating and drinking Follow instructions from your health care provider about what you may eat and drink. Drink enough fluid to keep your urine pale yellow. If you vomit: Drink clear fluids slowly and in small amounts as you are able. Clear fluids include water, ice chips, low-calorie sports drinks, and fruit juice that has water added to it (diluted fruit juice). Eat light and bland foods in small amounts as you are able. These foods include bananas, applesauce, rice, lean  meats, toast, and crackers. General instructions  Have a responsible adult stay with you for the time you are told. It is important to have someone help care for you until you are awake and alert. If you have sleep apnea, surgery and some medicines can increase your risk for breathing problems. Follow instructions from your health care provider about wearing your sleep device: When you  are sleeping. This includes during daytime naps. While taking prescription pain medicines, sleeping medicines, or medicines that make you drowsy. Do not use any products that contain nicotine or tobacco. These products include cigarettes, chewing tobacco, and vaping devices, such as e-cigarettes. If you need help quitting, ask your health care provider. Contact a health care provider if: You feel nauseous or vomit every time you eat or drink. You feel light-headed. You are still sleepy or having trouble with balance after 24 hours. You get a rash. You have a fever. You have redness or swelling around the IV site. Get help right away if: You have trouble breathing. You have new confusion after you get home. These symptoms may be an emergency. Get help right away. Call 911. Do not wait to see if the symptoms will go away. Do not drive yourself to the hospital. This information is not intended to replace advice given to you by your health care provider. Make sure you discuss any questions you have with your health care provider. Document Revised: 05/15/2021 Document Reviewed: 05/15/2021 Elsevier Patient Education  [place]. How to Use Chlorhexidine Before Surgery Chlorhexidine gluconate (CHG) is a germ-killing (antiseptic) solution that is used to clean the skin. It can get rid of the bacteria that normally live on the skin and can keep them away for about 24 hours. To clean your skin with CHG, you may be given: A CHG solution to use in the shower or as part of a sponge bath. A prepackaged cloth that  contains CHG. Cleaning your skin with CHG may help lower the risk for infection: While you are staying in the intensive care unit of the hospital. If you have a vascular access, such as a central line, to provide short-term or long-term access to your veins. If you have a catheter to drain urine from your bladder. If you are on a ventilator. A ventilator is a machine that helps you breathe by moving air in and out of your lungs. After surgery. What are the risks? Risks of using CHG include: A skin reaction. Hearing loss, if CHG gets in your ears and you have a perforated eardrum. Eye injury, if CHG gets in your eyes and is not rinsed out. The CHG product catching fire. Make sure that you avoid smoking and flames after applying CHG to your skin. Do not use CHG: If you have a chlorhexidine allergy or have previously reacted to chlorhexidine. On babies younger than 16 months of age. How to use CHG solution Use CHG only as told by your health care provider, and follow the instructions on the label. Use the full amount of CHG as directed. Usually, this is one bottle. During a shower Follow these steps when using CHG solution during a shower (unless your health care provider gives you different instructions): Start the shower. Use your normal soap and shampoo to wash your face and hair. Turn off the shower or move out of the shower stream. Pour the CHG onto a clean washcloth. Do not use any type of brush or rough-edged sponge. Starting at your neck, lather your body down to your toes. Make sure you follow these instructions: If you will be having surgery, pay special attention to the part of your body where you will be having surgery. Scrub this area for at least 1 minute. Do not use CHG on your head or face. If the solution gets into your ears or eyes, rinse them well with water. Avoid your genital  area. Avoid any areas of skin that have broken skin, cuts, or scrapes. Scrub your back and  under your arms. Make sure to wash skin folds. Let the lather sit on your skin for 1-2 minutes or as long as told by your health care provider. Thoroughly rinse your entire body in the shower. Make sure that all body creases and crevices are rinsed well. Dry off with a clean towel. Do not put any substances on your body afterward--such as powder, lotion, or perfume--unless you are told to do so by your health care provider. Only use lotions that are recommended by the manufacturer. Put on clean clothes or pajamas. If it is the night before your surgery, sleep in clean sheets.  During a sponge bath Follow these steps when using CHG solution during a sponge bath (unless your health care provider gives you different instructions): Use your normal soap and shampoo to wash your face and hair. Pour the CHG onto a clean washcloth. Starting at your neck, lather your body down to your toes. Make sure you follow these instructions: If you will be having surgery, pay special attention to the part of your body where you will be having surgery. Scrub this area for at least 1 minute. Do not use CHG on your head or face. If the solution gets into your ears or eyes, rinse them well with water. Avoid your genital area. Avoid any areas of skin that have broken skin, cuts, or scrapes. Scrub your back and under your arms. Make sure to wash skin folds. Let the lather sit on your skin for 1-2 minutes or as long as told by your health care provider. Using a different clean, wet washcloth, thoroughly rinse your entire body. Make sure that all body creases and crevices are rinsed well. Dry off with a clean towel. Do not put any substances on your body afterward--such as powder, lotion, or perfume--unless you are told to do so by your health care provider. Only use lotions that are recommended by the manufacturer. Put on clean clothes or pajamas. If it is the night before your surgery, sleep in clean sheets. How to use  CHG prepackaged cloths Only use CHG cloths as told by your health care provider, and follow the instructions on the label. Use the CHG cloth on clean, dry skin. Do not use the CHG cloth on your head or face unless your health care provider tells you to. When washing with the CHG cloth: Avoid your genital area. Avoid any areas of skin that have broken skin, cuts, or scrapes. Before surgery Follow these steps when using a CHG cloth to clean before surgery (unless your health care provider gives you different instructions): Using the CHG cloth, vigorously scrub the part of your body where you will be having surgery. Scrub using a back-and-forth motion for 3 minutes. The area on your body should be completely wet with CHG when you are done scrubbing. Do not rinse. Discard the cloth and let the area air-dry. Do not put any substances on the area afterward, such as powder, lotion, or perfume. Put on clean clothes or pajamas. If it is the night before your surgery, sleep in clean sheets.  For general bathing Follow these steps when using CHG cloths for general bathing (unless your health care provider gives you different instructions). Use a separate CHG cloth for each area of your body. Make sure you wash between any folds of skin and between your fingers and toes. Wash your body  in the following order, switching to a new cloth after each step: The front of your neck, shoulders, and chest. Both of your arms, under your arms, and your hands. Your stomach and groin area, avoiding the genitals. Your right leg and foot. Your left leg and foot. The back of your neck, your back, and your buttocks. Do not rinse. Discard the cloth and let the area air-dry. Do not put any substances on your body afterward--such as powder, lotion, or perfume--unless you are told to do so by your health care provider. Only use lotions that are recommended by the manufacturer. Put on clean clothes or pajamas. Contact a health  care provider if: Your skin gets irritated after scrubbing. You have questions about using your solution or cloth. You swallow any chlorhexidine. Call your local poison control center (1-(782)277-4666 in the [place] Get help right away if: Your eyes itch badly, or they become very red or swollen. Your skin itches badly and is red or swollen. Your hearing changes. You have trouble seeing. You have swelling or tingling in your mouth or throat. You have trouble breathing. These symptoms may represent a serious problem that is an emergency. Do not wait to see if the symptoms will go away. Get medical help right away. Call your local emergency services (911 in the [place] Do not drive yourself to the hospital. Summary Chlorhexidine gluconate (CHG) is a germ-killing (antiseptic) solution that is used to clean the skin. Cleaning your skin with CHG may help to lower your risk for infection. You may be given CHG to use for bathing. It may be in a bottle or in a prepackaged cloth to use on your skin. Carefully follow your health care provider's instructions and the instructions on the product label. Do not use CHG if you have a chlorhexidine allergy. Contact your health care provider if your skin gets irritated after scrubbing. This information is not intended to replace advice given to you by your health care provider. Make sure you discuss any questions you have with your health care provider. Document Revised: 04/17/2021 Document Reviewed: 02/29/2020 Elsevier Patient Education  [place].

## 2022-02-21 ENCOUNTER — Encounter (HOSPITAL_COMMUNITY): Payer: Self-pay

## 2022-02-21 ENCOUNTER — Encounter (HOSPITAL_COMMUNITY)
Admission: RE | Admit: 2022-02-21 | Discharge: 2022-02-21 | Disposition: A | Discharge status: Home / Self Care | Payer: Medicare PPO | Source: Ambulatory Visit | Attending: Podiatry | Admitting: Podiatry

## 2022-02-21 ENCOUNTER — Ambulatory Visit (HOSPITAL_COMMUNITY)
Admission: RE | Admit: 2022-02-21 | Discharge: 2022-02-21 | Disposition: A | Discharge status: Home / Self Care | Payer: Medicare PPO | Source: Ambulatory Visit | Attending: Podiatry | Admitting: Podiatry

## 2022-02-21 VITALS — BP 142/58 | HR 79 | Temp 97.5°F | Resp 18 | Ht 64.0 in | Wt 157.6 lb

## 2022-02-21 DIAGNOSIS — I10 Essential (primary) hypertension: Secondary | ICD-10-CM | POA: Insufficient documentation

## 2022-02-21 DIAGNOSIS — Z862 Personal history of diseases of the blood and blood-forming organs and certain disorders involving the immune mechanism: Secondary | ICD-10-CM | POA: Insufficient documentation

## 2022-02-21 DIAGNOSIS — Z01818 Encounter for other preprocedural examination: Secondary | ICD-10-CM | POA: Insufficient documentation

## 2022-02-21 DIAGNOSIS — M7731 Calcaneal spur, right foot: Secondary | ICD-10-CM | POA: Diagnosis not present

## 2022-02-21 DIAGNOSIS — M2011 Hallux valgus (acquired), right foot: Secondary | ICD-10-CM | POA: Diagnosis not present

## 2022-02-21 DIAGNOSIS — R7303 Prediabetes: Secondary | ICD-10-CM | POA: Insufficient documentation

## 2022-02-21 LAB — CBC WITH DIFFERENTIAL/PLATELET
Abs Immature Granulocytes: 0.02 10*3/uL (ref 0.00–0.07)
Basophils Absolute: 0 10*3/uL (ref 0.0–0.1)
Basophils Relative: 1 %
Eosinophils Absolute: 0.1 10*3/uL (ref 0.0–0.5)
Eosinophils Relative: 1 %
HCT: 33.4 % — ABNORMAL LOW (ref 36.0–46.0)
Hemoglobin: 11.1 g/dL — ABNORMAL LOW (ref 12.0–15.0)
Immature Granulocytes: 0 %
Lymphocytes Relative: 25 %
Lymphs Abs: 1.9 10*3/uL (ref 0.7–4.0)
MCH: 30.7 pg (ref 26.0–34.0)
MCHC: 33.2 g/dL (ref 30.0–36.0)
MCV: 92.5 fL (ref 80.0–100.0)
Monocytes Absolute: 0.9 10*3/uL (ref 0.1–1.0)
Monocytes Relative: 11 %
Neutro Abs: 4.6 10*3/uL (ref 1.7–7.7)
Neutrophils Relative %: 62 %
Platelets: 295 10*3/uL (ref 150–400)
RBC: 3.61 MIL/uL — ABNORMAL LOW (ref 3.87–5.11)
RDW: 12.7 % (ref 11.5–15.5)
WBC: 7.5 10*3/uL (ref 4.0–10.5)
nRBC: 0 % (ref 0.0–0.2)

## 2022-02-21 LAB — BASIC METABOLIC PANEL
Anion gap: 6 (ref 5–15)
BUN: 14 mg/dL (ref 8–23)
CO2: 26 mmol/L (ref 22–32)
Calcium: 8.5 mg/dL — ABNORMAL LOW (ref 8.9–10.3)
Chloride: 103 mmol/L (ref 98–111)
Creatinine, Ser: 0.92 mg/dL (ref 0.44–1.00)
GFR, Estimated: >60 mL/min (ref >60)
Glucose, Bld: 109 mg/dL — ABNORMAL HIGH (ref 70–99)
Potassium: 3.7 mmol/L (ref 3.5–5.1)
Sodium: 135 mmol/L (ref 135–145)

## 2022-02-23 ENCOUNTER — Ambulatory Visit (HOSPITAL_COMMUNITY): Payer: Medicare PPO | Admitting: Anesthesiology

## 2022-02-23 ENCOUNTER — Encounter (HOSPITAL_COMMUNITY): Payer: Self-pay | Admitting: Podiatry

## 2022-02-23 ENCOUNTER — Encounter (HOSPITAL_COMMUNITY)
Admission: RE | Disposition: A | Discharge status: Home / Self Care | Payer: Self-pay | Source: Home / Self Care | Attending: Podiatry

## 2022-02-23 ENCOUNTER — Ambulatory Visit (HOSPITAL_COMMUNITY)
Admission: RE | Admit: 2022-02-23 | Discharge: 2022-02-23 | Disposition: A | Discharge status: Home / Self Care | Payer: Medicare PPO | Attending: Podiatry | Admitting: Podiatry

## 2022-02-23 ENCOUNTER — Ambulatory Visit (HOSPITAL_COMMUNITY): Payer: Medicare PPO

## 2022-02-23 DIAGNOSIS — K219 Gastro-esophageal reflux disease without esophagitis: Secondary | ICD-10-CM | POA: Diagnosis not present

## 2022-02-23 DIAGNOSIS — G8929 Other chronic pain: Secondary | ICD-10-CM | POA: Insufficient documentation

## 2022-02-23 DIAGNOSIS — M2041 Other hammer toe(s) (acquired), right foot: Secondary | ICD-10-CM | POA: Diagnosis not present

## 2022-02-23 DIAGNOSIS — R000 Tachycardia, unspecified: Secondary | ICD-10-CM | POA: Diagnosis not present

## 2022-02-23 DIAGNOSIS — D649 Anemia, unspecified: Secondary | ICD-10-CM | POA: Diagnosis not present

## 2022-02-23 DIAGNOSIS — M2011 Hallux valgus (acquired), right foot: Secondary | ICD-10-CM

## 2022-02-23 DIAGNOSIS — M79671 Pain in right foot: Secondary | ICD-10-CM

## 2022-02-23 DIAGNOSIS — Z79899 Other long term (current) drug therapy: Secondary | ICD-10-CM | POA: Diagnosis not present

## 2022-02-23 DIAGNOSIS — J45909 Unspecified asthma, uncomplicated: Secondary | ICD-10-CM | POA: Insufficient documentation

## 2022-02-23 DIAGNOSIS — Z9889 Other specified postprocedural states: Secondary | ICD-10-CM | POA: Diagnosis not present

## 2022-02-23 DIAGNOSIS — K3184 Gastroparesis: Secondary | ICD-10-CM | POA: Diagnosis not present

## 2022-02-23 HISTORY — PX: AIKEN OSTEOTOMY: SHX6331

## 2022-02-23 HISTORY — PX: BUNIONECTOMY: SHX129

## 2022-02-23 HISTORY — PX: HAMMER TOE SURGERY: SHX385

## 2022-02-23 HISTORY — PX: REPAIR EXTENSOR TENDON: SHX5382

## 2022-02-23 HISTORY — PX: WEIL OSTEOTOMY: SHX5044

## 2022-02-23 HISTORY — PX: METATARSAL OSTEOTOMY: SHX1641

## 2022-02-23 SURGERY — REPAIR, TENDON, EXTENSOR
Anesthesia: General | Site: Toe | Laterality: Right

## 2022-02-23 MED ORDER — LIDOCAINE HCL (PF) 1 % IJ SOLN
INTRAMUSCULAR | Status: DC | PRN
  Administered 2022-02-23: 20 mL

## 2022-02-23 MED ORDER — LIDOCAINE HCL (PF) 1 % IJ SOLN
INTRAMUSCULAR | Status: AC
  Filled 2022-02-23: qty 30

## 2022-02-23 MED ORDER — CLINDAMYCIN PHOSPHATE 900 MG/50ML IV SOLN
900.0000 mg | INTRAVENOUS | Status: AC
  Administered 2022-02-23: 900 mg via INTRAVENOUS
  Filled 2022-02-23: qty 50

## 2022-02-23 MED ORDER — FENTANYL CITRATE (PF) 100 MCG/2ML IJ SOLN
INTRAMUSCULAR | Status: AC
  Filled 2022-02-23: qty 2

## 2022-02-23 MED ORDER — PHENYLEPHRINE HCL (PRESSORS) 10 MG/ML IV SOLN
INTRAVENOUS | Status: DC | PRN
  Administered 2022-02-23: 160 ug via INTRAVENOUS
  Administered 2022-02-23 (×2): 80 ug via INTRAVENOUS

## 2022-02-23 MED ORDER — PROPOFOL 10 MG/ML IV BOLUS
INTRAVENOUS | Status: AC
  Filled 2022-02-23: qty 20

## 2022-02-23 MED ORDER — PROPOFOL 500 MG/50ML IV EMUL
INTRAVENOUS | Status: AC
  Filled 2022-02-23: qty 50

## 2022-02-23 MED ORDER — ORAL CARE MOUTH RINSE: 15.0000 mL | Freq: Once | OROMUCOSAL | Status: AC

## 2022-02-23 MED ORDER — ONDANSETRON HCL 4 MG/2ML IJ SOLN
INTRAMUSCULAR | Status: DC | PRN
  Administered 2022-02-23: 4 mg via INTRAVENOUS

## 2022-02-23 MED ORDER — DEXAMETHASONE SODIUM PHOSPHATE 10 MG/ML IJ SOLN
INTRAMUSCULAR | Status: DC | PRN
  Administered 2022-02-23: 8 mg via INTRAVENOUS

## 2022-02-23 MED ORDER — LIDOCAINE HCL 1 % IJ SOLN
INTRAMUSCULAR | Status: DC | PRN
  Administered 2022-02-23: 50 mg via INTRADERMAL

## 2022-02-23 MED ORDER — PROPOFOL 500 MG/50ML IV EMUL
INTRAVENOUS | Status: DC | PRN
  Administered 2022-02-23: 100 ug/kg/min via INTRAVENOUS

## 2022-02-23 MED ORDER — PROPOFOL 10 MG/ML IV BOLUS
INTRAVENOUS | Status: DC | PRN
  Administered 2022-02-23 (×4): 20 mg via INTRAVENOUS

## 2022-02-23 MED ORDER — KETOROLAC TROMETHAMINE 30 MG/ML IJ SOLN
INTRAMUSCULAR | Status: DC | PRN
  Administered 2022-02-23: 30 mg via INTRAVENOUS

## 2022-02-23 MED ORDER — ONDANSETRON HCL 4 MG/2ML IJ SOLN
INTRAMUSCULAR | Status: AC
  Filled 2022-02-23: qty 2

## 2022-02-23 MED ORDER — FENTANYL CITRATE (PF) 100 MCG/2ML IJ SOLN
INTRAMUSCULAR | Status: DC | PRN
  Administered 2022-02-23 (×4): 25 ug via INTRAVENOUS

## 2022-02-23 MED ORDER — 0.9 % SODIUM CHLORIDE (POUR BTL) OPTIME
TOPICAL | Status: DC | PRN
  Administered 2022-02-23: 1000 mL

## 2022-02-23 MED ORDER — ONDANSETRON HCL 4 MG/2ML IJ SOLN: 4.0000 mg | Freq: Once | INTRAMUSCULAR | Status: DC | PRN

## 2022-02-23 MED ORDER — BUPIVACAINE HCL (PF) 0.5 % IJ SOLN
INTRAMUSCULAR | Status: DC | PRN
  Administered 2022-02-23: 10 mL
  Administered 2022-02-23: 20 mL

## 2022-02-23 MED ORDER — BUPIVACAINE HCL (PF) 0.5 % IJ SOLN
INTRAMUSCULAR | Status: AC
  Filled 2022-02-23: qty 30

## 2022-02-23 MED ORDER — CHLORHEXIDINE GLUCONATE 0.12 % MT SOLN
15.0000 mL | Freq: Once | OROMUCOSAL | Status: AC
  Administered 2022-02-23: 15 mL via OROMUCOSAL

## 2022-02-23 MED ORDER — LACTATED RINGERS IV SOLN: INTRAVENOUS | Status: DC

## 2022-02-23 MED ORDER — SEVOFLURANE IN SOLN
RESPIRATORY_TRACT | Status: AC
  Filled 2022-02-23: qty 250

## 2022-02-23 MED ORDER — MIDAZOLAM HCL 5 MG/5ML IJ SOLN
INTRAMUSCULAR | Status: DC | PRN
  Administered 2022-02-23: 1 mg via INTRAVENOUS

## 2022-02-23 MED ORDER — HYDROMORPHONE HCL 1 MG/ML IJ SOLN: 0.2500 mg | INTRAMUSCULAR | Status: DC | PRN

## 2022-02-23 MED ORDER — MIDAZOLAM HCL 2 MG/2ML IJ SOLN
INTRAMUSCULAR | Status: AC
  Filled 2022-02-23: qty 2

## 2022-02-23 MED ORDER — KETOROLAC TROMETHAMINE 30 MG/ML IJ SOLN
INTRAMUSCULAR | Status: AC
  Filled 2022-02-23: qty 1

## 2022-02-23 MED ORDER — LIDOCAINE HCL (PF) 2 % IJ SOLN
INTRAMUSCULAR | Status: AC
  Filled 2022-02-23: qty 5

## 2022-02-23 MED ORDER — DEXAMETHASONE SODIUM PHOSPHATE 10 MG/ML IJ SOLN
INTRAMUSCULAR | Status: AC
  Filled 2022-02-23: qty 1

## 2022-02-23 SURGICAL SUPPLY — 67 items
APL PRP STRL LF DISP 70% ISPRP (MISCELLANEOUS) ×2
APL SKNCLS STERI-STRIP NONHPOA (GAUZE/BANDAGES/DRESSINGS) ×2
BANDAGE ESMARK 4X12 BL STRL LF (DISPOSABLE) ×2 IMPLANT
BENZOIN TINCTURE PRP APPL 2/3 (GAUZE/BANDAGES/DRESSINGS) ×2 IMPLANT
BIT DRILL CANNULATED 4MM (BIT) IMPLANT
BLADE AVERAGE 25X9 (BLADE) ×2 IMPLANT
BLADE OSC/SAG 11.5X5.5X.38 (BLADE) IMPLANT
BLADE SURG 15 STRL LF DISP TIS (BLADE) ×4 IMPLANT
BLADE SURG 15 STRL SS (BLADE) ×8
BNDG CMPR 12X4 ELC STRL LF (DISPOSABLE) ×2
BNDG CMPR 82X61 PLY HI ABS (GAUZE/BANDAGES/DRESSINGS) ×2
BNDG CMPR STD VLCR NS LF 5.8X4 (GAUZE/BANDAGES/DRESSINGS) ×2
BNDG CONFORM 2 STRL LF (GAUZE/BANDAGES/DRESSINGS) ×2 IMPLANT
BNDG CONFORM 6X.82 1P STRL (GAUZE/BANDAGES/DRESSINGS) IMPLANT
BNDG ELASTIC 4X5.8 VLCR NS LF (GAUZE/BANDAGES/DRESSINGS) ×2 IMPLANT
BNDG ELASTIC 4X5.8 VLCR STR LF (GAUZE/BANDAGES/DRESSINGS) IMPLANT
BNDG ESMARK 4X12 BLUE STRL LF (DISPOSABLE) ×2
BNDG GAUZE DERMACEA FLUFF 4 (GAUZE/BANDAGES/DRESSINGS) IMPLANT
BNDG GAUZE ELAST 4 BULKY (GAUZE/BANDAGES/DRESSINGS) ×2 IMPLANT
BNDG GZE DERMACEA 4 6PLY (GAUZE/BANDAGES/DRESSINGS) ×2
BOOT STEPPER DURA MED (SOFTGOODS) IMPLANT
CAP PIN PROTECTOR ORTHO WHT (CAP) IMPLANT
CHLORAPREP W/TINT 26 (MISCELLANEOUS) ×2 IMPLANT
CLOTH BEACON ORANGE TIMEOUT ST (SAFETY) ×2 IMPLANT
COVER LIGHT HANDLE STERIS (MISCELLANEOUS) ×4 IMPLANT
CUFF TOURN SGL QUICK 18X4 (TOURNIQUET CUFF) ×2 IMPLANT
DECANTER SPIKE VIAL GLASS SM (MISCELLANEOUS) ×4 IMPLANT
DRAPE OEC MINIVIEW 54X84 (DRAPES) ×2 IMPLANT
DRILL CANNULATED 4MM (BIT) ×2
DRSG ADAPTIC 3X8 NADH LF (GAUZE/BANDAGES/DRESSINGS) ×2 IMPLANT
ELECT REM PT RETURN 9FT ADLT (ELECTROSURGICAL) ×2
ELECTRODE REM PT RTRN 9FT ADLT (ELECTROSURGICAL) ×2 IMPLANT
GAUZE SPONGE 4X4 12PLY STRL (GAUZE/BANDAGES/DRESSINGS) ×2 IMPLANT
GLOVE BIO SURGEON STRL SZ7 (GLOVE) IMPLANT
GLOVE BIO SURGEON STRL SZ7.5 (GLOVE) ×2 IMPLANT
GLOVE BIOGEL PI IND STRL 7.0 (GLOVE) ×4 IMPLANT
GOWN STRL REUS W/ TWL LRG LVL3 (GOWN DISPOSABLE) ×2 IMPLANT
GOWN STRL REUS W/ TWL XL LVL3 (GOWN DISPOSABLE) ×2 IMPLANT
GOWN STRL REUS W/TWL LRG LVL3 (GOWN DISPOSABLE) ×6 IMPLANT
GOWN STRL REUS W/TWL XL LVL3 (GOWN DISPOSABLE) ×2
GUIDEWIRE 0.9X100 SMOOTH (WIRE) IMPLANT
GUIDWIRE 0.90MM X 100MM SMOOTH (WIRE) ×2
K-WIRE 6 (WIRE) ×8
K-WIRE FIXATION CANN 1.1X120 (WIRE) ×2
KIRSCHNER WIRE 6IN X 1.1MM IMPLANT
KIT TURNOVER KIT A (KITS) ×2 IMPLANT
KWIRE 6 (WIRE) IMPLANT
KWIRE FIXATION CANN 1.1X120 (WIRE) IMPLANT
MANIFOLD NEPTUNE II (INSTRUMENTS) ×2 IMPLANT
NDL HYPO 27GX1-1/4 (NEEDLE) ×6 IMPLANT
NEEDLE HYPO 27GX1-1/4 (NEEDLE) ×6 IMPLANT
NS IRRIG 1000ML POUR BTL (IV SOLUTION) ×2 IMPLANT
PACK BASIC LIMB (CUSTOM PROCEDURE TRAY) ×2 IMPLANT
PAD ARMBOARD 7.5X6 YLW CONV (MISCELLANEOUS) ×2 IMPLANT
PIN CAPS ORTHO GREEN .062 (PIN) IMPLANT
RASP SM TEAR CROSS CUT (RASP) IMPLANT
SCREW LP 2.0X10 (Screw) IMPLANT
SCREW LP 4.0X20 (Screw) IMPLANT
SET BASIN LINEN APH (SET/KITS/TRAYS/PACK) ×2 IMPLANT
SPONGE T-LAP 18X18 ~~LOC~~+RFID (SPONGE) ×2 IMPLANT
STAPLE FIXATION 8X8X8 (Staple) IMPLANT
STRIP CLOSURE SKIN 1/2X4 (GAUZE/BANDAGES/DRESSINGS) ×4 IMPLANT
SUT PROLENE 4 0 PS 2 18 (SUTURE) ×2 IMPLANT
SUT VIC AB 2-0 CT2 27 (SUTURE) ×2 IMPLANT
SUT VIC AB 4-0 PS2 27 (SUTURE) ×2 IMPLANT
SUT VICRYL AB 3-0 FS1 BRD 27IN (SUTURE) ×2 IMPLANT
SYR CONTROL 10ML LL (SYRINGE) ×4 IMPLANT

## 2022-02-23 NOTE — Brief Op Note (Addendum)
BRIEF OPERATIVE NOTE  DATE OF PROCEDURE 02/23/2022  SURGEON Marcheta Grammes, DPM  ASSISTANT SURGEON None  OR STAFF Circulator: Kai Levins, RN Radiology Technologist: Wandra Feinstein, RT Relief Circulator: Tamera Reason, RN Scrub Person: Karin Lieu, CST OR Clinical Technician: Cleone Slim   PREOPERATIVE DIAGNOSIS Hallux valgus, right foot. 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

## 2022-02-23 NOTE — Anesthesia Postprocedure Evaluation (Signed)
Anesthesia Post Note  Patient: ZHANE CERNY  Procedure(s) Performed: LENGTHENING  OF EXTENSOR TENDON DIGITS 2 -5 OF RIGHT FOOT (Right: Foot) BUNIONECTOMY (Right: Toe) AIKEN OSTEOTOMY HALLUX (Right: Toe) CLOSING BASE WEDGE OSTEOTOMY - 1ST METATARSAL - PARTIAL HOLE DISTAL OSTEOTOMY = 1ST METATARSAL (Right: Toe) HAMMER TOE REPAIR DIGITS 2 - 5 (Right: Toe) WEIL OSTEOTOMY OF METATARSALS 2 (Right: Foot)  Patient location during evaluation: Phase II Anesthesia Type: General Level of consciousness: awake and alert and oriented Pain management: pain level controlled Vital Signs Assessment: post-procedure vital signs reviewed and stable Respiratory status: spontaneous breathing, nonlabored ventilation and respiratory function stable Cardiovascular status: blood pressure returned to baseline and stable Postop Assessment: no apparent nausea or vomiting Anesthetic complications: no  No notable events documented.   Last Vitals:  Vitals:   02/23/22 1200 02/23/22 1228  BP: (!) 147/70 (!) 140/67  Pulse: 83 81  Resp: 16 16  Temp:  36.9 C  SpO2: 95% 97%    Last Pain:  Vitals:   02/23/22 1228  TempSrc: Oral  PainSc: 0-No pain                 Ellamarie Naeve C [place]

## 2022-02-23 NOTE — Anesthesia Preprocedure Evaluation (Signed)
Anesthesia Evaluation  Patient identified by MRN, date of birth, ID band Patient awake    Reviewed: Allergy & Precautions, H&P , NPO status , Patient's Chart, lab work & pertinent test results  Airway Mallampati: II  TM Distance: >3 FB Neck ROM: Full   Comment: TMJ sx Dental  (+) Dental Advisory Given, Teeth Intact   Pulmonary asthma    Pulmonary exam normal breath sounds clear to auscultation       Cardiovascular negative cardio ROS Normal cardiovascular exam Rhythm:Regular Rate:Normal     Neuro/Psych  Headaches PSYCHIATRIC DISORDERS  Depression       GI/Hepatic Neg liver ROS,GERD (gastroparesis mild, controlled)  Medicated and Controlled,,  Endo/Other  negative endocrine ROS    Renal/GU negative Renal ROS  negative genitourinary   Musculoskeletal negative musculoskeletal ROS (+)    Abdominal   Peds negative pediatric ROS (+)  Hematology  (+) Blood dyscrasia, anemia   Anesthesia Other Findings   Reproductive/Obstetrics negative OB ROS                             Anesthesia Physical Anesthesia Plan  ASA: 2  Anesthesia Plan: General   Post-op Pain Management: Minimal or no pain anticipated and Dilaudid IV   Induction: Intravenous  PONV Risk Score and Plan: Propofol infusion, Ondansetron and Dexamethasone  Airway Management Planned: Nasal Cannula, Natural Airway and Simple Face Mask  Additional Equipment:   Intra-op Plan:   Post-operative Plan:   Informed Consent: I have reviewed the patients History and Physical, chart, labs and discussed the procedure including the risks, benefits and alternatives for the proposed anesthesia with the patient or authorized representative who has indicated his/her understanding and acceptance.     Dental advisory given  Plan Discussed with: CRNA and Surgeon  Anesthesia Plan Comments:        Anesthesia Quick Evaluation

## 2022-02-23 NOTE — H&P (Signed)
HISTORY AND PHYSICAL INTERVAL NOTE:  02/23/2022  7:02 AM  [name]  has presented today for surgery, with the diagnosis of hallux falgus, hammer toe, foot pain right foot.  The various methods of treatment have been discussed with the patient.  No guarantees were given.  After consideration of risks, benefits and other options for treatment, the patient has consented to surgery.  I have reviewed the patients' chart and labs.    Patient Vitals for the past 24 hrs:  BP Temp Pulse Resp SpO2  02/23/22 0655 (!) 141/90 98.8 F (37.1 C) 74 12 99 %    A history and physical examination was performed in my office.  The patient was reexamined.  There have been no changes to this history and physical examination.  Marcheta Grammes, DPM

## 2022-02-23 NOTE — Transfer of Care (Signed)
Immediate Anesthesia Transfer of Care Note  Patient: [name]  Procedure(s) Performed: LENGTHENING  OF EXTENSOR TENDON DIGITS 2 -5 OF RIGHT FOOT (Right: Foot) BUNIONECTOMY (Right: Toe) AIKEN OSTEOTOMY HALLUX (Right: Toe) CLOSING BASE WEDGE OSTEOTOMY - 1ST METATARSAL - PARTIAL HOLE DISTAL OSTEOTOMY = 1ST METATARSAL (Right: Toe) HAMMER TOE REPAIR DIGITS 2 - 5 (Right: Toe) WEIL OSTEOTOMY OF METATARSALS 2 (Right: Foot)  Patient Location: PACU  Anesthesia Type:General  Level of Consciousness: awake and alert   Airway & Oxygen Therapy: Patient Spontanous Breathing  Post-op Assessment: Report given to RN  Post vital signs: Reviewed and stable  Last Vitals:  Vitals Value Taken Time  BP 123/69 02/23/22 1120  Temp 37.1 C 02/23/22 1120  Pulse 86 02/23/22 1122  Resp 13 02/23/22 1122  SpO2 93 % 02/23/22 1122  Vitals shown include unvalidated device data.  Last Pain:  Vitals:   02/23/22 1120  PainSc: 0-No pain      Patients Stated Pain Goal: 5 ([id number])  Complications: No notable events documented.

## 2022-02-23 NOTE — Discharge Instructions (Signed)
These instructions will give you an idea of what to expect after surgery and how to manage issues that may arise before your first post op office visit.  Pain Management Pain is best managed by "staying ahead" of it. If pain gets out of control, it is difficult to get it back under control. Local anesthesia that lasts 6-8 hours is used to numb the foot and decrease pain.  For the best pain control, take the pain medication every 4 hours for the first 2 days post op. On the third day pain medication can be taken as needed.   Post Op Nausea Nausea is common after surgery, so it is managed proactively.  If prescribed, use the prescribed nausea medication regularly for the first 2 days post op.  Bandages Do not worry if there is blood on the bandage. What looks like a lot of blood on the bandage is actually a small amount. Blood on the dressing spreads out as it is absorbed by the gauze, the same way a drop of water spreads out on a paper towel.  If the bandages feel wet or dry, stiff and uncomfortable, call the office during office hours and we will schedule a time for you to have the bandage changed.  Unless you are specifically told otherwise, we will do the first bandage change in the office.  Keep your bandage dry. If the bandage becomes wet or soiled, notify the office and we will schedule a time to change the bandage.  Activity It is best to spend most of the first 2 days after surgery lying down with the foot elevated above the level of your heart. You may put weight on your heel while wearing the CAM Walker (black boot).   You may only get up to go to the restroom.  Driving Do not drive until you are able to respond in an emergency (i.e. slam on the brakes). This usually occurs after the bone has healed - 6 to 8 weeks.  Call the Office If you have a fever over 101F.  If you have increasing pain after the initial post op pain has settled down.  If you have increasing redness, swelling,  or drainage.  If you have any questions or concerns.

## 2022-02-23 NOTE — Op Note (Signed)
OPERATIVE NOTE  DATE OF PROCEDURE 02/23/2022  SURGEON Marcheta Grammes, DPM  ASSISTANT SURGEON None  OR STAFF Circulator: Kai Levins, RN Radiology Technologist: Wandra Feinstein, RT Relief Circulator: Tamera Reason, RN Scrub Person: Karin Lieu, CST OR Clinical Technician: Cleone Slim   PREOPERATIVE DIAGNOSIS Hallux valgus, right foot. 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, right foot Pain, right foot  POSTOPERATIVE DIAGNOSIS Same  PROCEDURE Bunionectomy with 2 metatarsal osteotomies, right foot Akin osteotomy of hallux, right foot Hammertoe repair of digits 2-5, right foot Weil osteotomy of 2nd metatarsal, right foot Extensor tenotomy of digits 4 and 5, right foot  ANESTHESIA Monitor Anesthesia Care   HEMOSTASIS Pneumatic ankle tourniquet set at 200 mmHg  ESTIMATED BLOOD LOSS ~50 cc  MATERIALS USED 4.0 screw x 1 3.0 screw x 1 Stryker EasyClip Staple x 1 0.045" K-wire x 2  INJECTABLES 0.5% Marcaine plain 1% Lidocaine plain  PATHOLOGY None  COMPLICATIONS None  INDICATIONS:  Chronic pain and recurrent hyperkeratotic lesions of the right foot.  DESCRIPTION OF THE PROCEDURE:  The patient was brought to the operating room and placed on the operative table in the supine position.  A pneumatic ankle tourniquet was placed about the patient's right ankle.  The right foot was anesthetized with 0.5% Marcaine plain.  The foot was scrubbed, prepped and draped in the usual sterile manner.  A timeout was performed.  The limb was elevated, exsanguinated and the pneumatic ankle tourniquet inflated to 200 mmHg.   Attention was directed to the dorsal medial aspect of the right foot where a linear longitudinal incision was made medial and parallel to the extensor hallucis longus tendon.  Dissection was continued deep down to the level of the first MPJ.  A T-type capsulotomy was performed.  The periosteal and capsular structures were reflected medially and  laterally exposing the first metatarsal head.  The periosteal incision was extended dorsally to the base of the first metatarsal.  A closing base wedge osteotomy was performed.  The osteotomy was fixated with a single 4.0 millimeter screw.  Attention was directed to the distal portion of first metatarsal where a chevron osteotomy was performed.  The capital fragment was distracted, translated laterally and fixated with a single 3.0 millimeter screw.  The remaining medial bone shelf was resected using a power bone saw.  The periosteal incision was extended over the base of the proximal phalanx.  An Akin osteotomy was performed and fixated using a single bone staple.  Correction of the first ray deformity was assessed under fluoroscopy and found to be acceptable.  The surgical field was irrigated with copious amounts of sterile saline.  A medial capsulorrhaphy was performed using 2-0 Vicryl.  The periosteal and capsular structures were approximately reapproximating 3-0 Vicryl.  The subcutaneous structures were reapproximated using 4-0 Vicryl.  The skin was reapproximated using 4-0 Prolene.  Attention was directed to the dorsal aspect of the second toe where a curvilinear longitudinal incision was made overling the PIPJ extending toward and crossing the second MPJ.  The extensor hood was released.  A transverse tenotomy and capsulotomy was performed at the level of the PIPJ.  The articular surface was resected from the head of the proximal phalanx and base of the middle phalanx and passed from the operative field.  A linear longitudinal capsular and periosteral incision was made over the second MPJ.  The capsular and periosteal structures were reflected medially and laterally.  A Weil osteotomy was performed using a power  bone saw and fixated using a 2.0 millimeter screw.  The toe was fixated using a 0.045 inch Kirschner wire crossing the MPJ.  The surgical wound was irrigated with copious amounts of sterile  irrigant.  The capsular structures were reapproximated using 4-0 Vicryl.  The extensor tendon was reapproximated with 4-0 Vicryl.  The subcutaneous structures reapproximated using 4-0 Vicryl.  The skin was reapproximated using 4-0 Prolene.    Attention was directed to the dorsal aspect of the third toe where a linear longitudinal incision was made overling the PIPJ.  The extensor hood was released.  A transverse tenotomy and capsulotomy was performed at the level of the PIPJ.  The articular surface was resected from the head of the proximal phalanx and base of the middle phalanx and passed from the operative field.  A linear longitudinal capsular and periosteral incision was made over the second MPJ.  The capsular and periosteal structures were reflected medially and laterally.  The toe was fixated using a 0.045 inch Kirschner wire.  The surgical wound was irrigated with copious amounts of sterile irrigant.  The capsular structures were reapproximated using 4-0 Vicryl.  The extensor tendon was reapproximated with 4-0 Vicryl.  The subcutaneous structures reapproximated using 4-0 Vicryl.  The skin was reapproximated using 4-0 Prolene.  Attention was directed to the dorsal aspect of the fourth toe where linear longitudinal incision was made over the PIPJ.  A transverse tenotomy and capsulotomy was performed at the level of the PIPJ.  The articular surface was resected from the head of the proximal phalanx and base of middle phalanx.  The extensor tendon was reapproximated using 4-0 Vicryl.  The skin was reapproximated using 4-0 Prolene.    Attention was directed to the fifth toe.  2 converging semielliptical incisions were made overlying the PIPJ.  The wedge of skin was removed and passed from the operative field.  The head of the proximal phalanx was resected and passed from the operative field.  The toe was derotated and the extensor tendon was reapproximated using 4-0 Vicryl.  The skin was reapproximated using  4-0 Prolene in a simple suture technique.    There was some residual contracture of the fourth and fifth toes at the MPJ.  The extensor tendons were taut.  A stab incision was made proximal to the fourth MPJ.  The extensor tendon was transected and the residual contracture reduces.  A stab incision was made proximal to the fifth MPJ.  The extensor tendon was transected and the residual contracture reduces.   A sterile compressive dressing was applied to the right foot.  The pneumatic ankle tourniquet was deflated and a prompt hyperemic response was noted to all digits of the right foot.  The patient tolerated the procedures and anesthesia well.  She was transferred from the operating room to the postanesthesia care unit with vital signs stable and vascular status intact to all digits of the right foot.

## 2022-03-07 ENCOUNTER — Encounter (HOSPITAL_COMMUNITY): Payer: Self-pay | Admitting: Podiatry

## 2022-04-27 ENCOUNTER — Ambulatory Visit
Admission: RE | Admit: 2022-04-27 | Discharge: 2022-04-27 | Disposition: A | Discharge status: Home / Self Care | Payer: Medicare PPO | Source: Ambulatory Visit | Attending: Hematology and Oncology | Admitting: Hematology and Oncology

## 2022-04-27 DIAGNOSIS — Z1231 Encounter for screening mammogram for malignant neoplasm of breast: Secondary | ICD-10-CM | POA: Diagnosis not present

## 2022-04-27 DIAGNOSIS — N6092 Unspecified benign mammary dysplasia of left breast: Secondary | ICD-10-CM

## 2022-05-22 DIAGNOSIS — N1831 Chronic kidney disease, stage 3a: Secondary | ICD-10-CM | POA: Diagnosis not present

## 2022-05-22 DIAGNOSIS — D649 Anemia, unspecified: Secondary | ICD-10-CM | POA: Diagnosis not present

## 2022-05-22 DIAGNOSIS — K219 Gastro-esophageal reflux disease without esophagitis: Secondary | ICD-10-CM | POA: Diagnosis not present

## 2022-05-22 DIAGNOSIS — Z0000 Encounter for general adult medical examination without abnormal findings: Secondary | ICD-10-CM | POA: Diagnosis not present

## 2022-05-22 DIAGNOSIS — E785 Hyperlipidemia, unspecified: Secondary | ICD-10-CM | POA: Diagnosis not present

## 2022-05-29 DIAGNOSIS — R7301 Impaired fasting glucose: Secondary | ICD-10-CM | POA: Diagnosis not present

## 2022-05-29 DIAGNOSIS — R030 Elevated blood-pressure reading, without diagnosis of hypertension: Secondary | ICD-10-CM | POA: Diagnosis not present

## 2022-05-29 DIAGNOSIS — J309 Allergic rhinitis, unspecified: Secondary | ICD-10-CM | POA: Diagnosis not present

## 2022-05-29 DIAGNOSIS — R82998 Other abnormal findings in urine: Secondary | ICD-10-CM | POA: Diagnosis not present

## 2022-05-29 DIAGNOSIS — Z820 Family history of epilepsy and other diseases of the nervous system: Secondary | ICD-10-CM | POA: Diagnosis not present

## 2022-05-29 DIAGNOSIS — Z1331 Encounter for screening for depression: Secondary | ICD-10-CM | POA: Diagnosis not present

## 2022-05-29 DIAGNOSIS — Z0000 Encounter for general adult medical examination without abnormal findings: Secondary | ICD-10-CM | POA: Diagnosis not present

## 2022-05-29 DIAGNOSIS — N1831 Chronic kidney disease, stage 3a: Secondary | ICD-10-CM | POA: Diagnosis not present

## 2022-05-29 DIAGNOSIS — I868 Varicose veins of other specified sites: Secondary | ICD-10-CM | POA: Diagnosis not present

## 2022-07-19 DIAGNOSIS — R300 Dysuria: Secondary | ICD-10-CM | POA: Diagnosis not present

## 2022-07-19 DIAGNOSIS — D72825 Bandemia: Secondary | ICD-10-CM | POA: Diagnosis not present

## 2022-07-19 DIAGNOSIS — R000 Tachycardia, unspecified: Secondary | ICD-10-CM | POA: Diagnosis not present

## 2022-07-19 DIAGNOSIS — R030 Elevated blood-pressure reading, without diagnosis of hypertension: Secondary | ICD-10-CM | POA: Diagnosis not present

## 2022-07-19 DIAGNOSIS — N3001 Acute cystitis with hematuria: Secondary | ICD-10-CM | POA: Diagnosis not present

## 2022-07-19 DIAGNOSIS — N390 Urinary tract infection, site not specified: Secondary | ICD-10-CM | POA: Diagnosis not present

## 2022-07-19 DIAGNOSIS — N1831 Chronic kidney disease, stage 3a: Secondary | ICD-10-CM | POA: Diagnosis not present

## 2022-07-24 DIAGNOSIS — K602 Anal fissure, unspecified: Secondary | ICD-10-CM | POA: Diagnosis not present

## 2022-07-24 DIAGNOSIS — K5903 Drug induced constipation: Secondary | ICD-10-CM | POA: Diagnosis not present

## 2022-07-27 DIAGNOSIS — N390 Urinary tract infection, site not specified: Secondary | ICD-10-CM | POA: Diagnosis not present

## 2022-07-31 DIAGNOSIS — M7989 Other specified soft tissue disorders: Secondary | ICD-10-CM | POA: Diagnosis not present

## 2022-07-31 DIAGNOSIS — R600 Localized edema: Secondary | ICD-10-CM | POA: Diagnosis not present

## 2022-07-31 DIAGNOSIS — I83813 Varicose veins of bilateral lower extremities with pain: Secondary | ICD-10-CM | POA: Diagnosis not present

## 2022-07-31 DIAGNOSIS — I872 Venous insufficiency (chronic) (peripheral): Secondary | ICD-10-CM | POA: Diagnosis not present

## 2022-08-06 DIAGNOSIS — Z780 Asymptomatic menopausal state: Secondary | ICD-10-CM | POA: Diagnosis not present

## 2022-08-28 DIAGNOSIS — L570 Actinic keratosis: Secondary | ICD-10-CM | POA: Diagnosis not present

## 2022-08-28 DIAGNOSIS — L820 Inflamed seborrheic keratosis: Secondary | ICD-10-CM | POA: Diagnosis not present

## 2022-08-28 DIAGNOSIS — B078 Other viral warts: Secondary | ICD-10-CM | POA: Diagnosis not present

## 2022-08-28 DIAGNOSIS — L578 Other skin changes due to chronic exposure to nonionizing radiation: Secondary | ICD-10-CM | POA: Diagnosis not present

## 2022-08-28 DIAGNOSIS — L821 Other seborrheic keratosis: Secondary | ICD-10-CM | POA: Diagnosis not present

## 2022-09-20 DIAGNOSIS — K5903 Drug induced constipation: Secondary | ICD-10-CM | POA: Diagnosis not present

## 2022-09-20 DIAGNOSIS — K602 Anal fissure, unspecified: Secondary | ICD-10-CM | POA: Diagnosis not present

## 2022-10-01 DIAGNOSIS — N390 Urinary tract infection, site not specified: Secondary | ICD-10-CM | POA: Diagnosis not present

## 2022-10-15 ENCOUNTER — Other Ambulatory Visit: Payer: Self-pay

## 2022-10-15 ENCOUNTER — Telehealth: Payer: Self-pay

## 2022-10-15 DIAGNOSIS — Z1239 Encounter for other screening for malignant neoplasm of breast: Secondary | ICD-10-CM

## 2022-10-15 DIAGNOSIS — Z9189 Other specified personal risk factors, not elsewhere classified: Secondary | ICD-10-CM

## 2022-10-15 DIAGNOSIS — N6092 Unspecified benign mammary dysplasia of left breast: Secondary | ICD-10-CM

## 2022-10-15 NOTE — Telephone Encounter (Signed)
Pt called and LVM asking for order for MRI bilateral breast. She states she is supposed to have one every year and she had her MM 6 mos ago. Per MD, order placed for MRI and email sent to Lucia Gaskins at [facility] for facilitate scheduling. Pt is aware and knows to expect a call from their office.

## 2022-10-22 DIAGNOSIS — K602 Anal fissure, unspecified: Secondary | ICD-10-CM | POA: Diagnosis not present

## 2022-10-22 DIAGNOSIS — L309 Dermatitis, unspecified: Secondary | ICD-10-CM | POA: Diagnosis not present

## 2022-10-25 ENCOUNTER — Ambulatory Visit
Admission: RE | Admit: 2022-10-25 | Discharge: 2022-10-25 | Disposition: A | Discharge status: Home / Self Care | Payer: Medicare PPO | Source: Ambulatory Visit | Attending: Hematology and Oncology | Admitting: Hematology and Oncology

## 2022-10-25 DIAGNOSIS — Z803 Family history of malignant neoplasm of breast: Secondary | ICD-10-CM | POA: Diagnosis not present

## 2022-10-25 DIAGNOSIS — N6092 Unspecified benign mammary dysplasia of left breast: Secondary | ICD-10-CM

## 2022-10-25 DIAGNOSIS — Z1239 Encounter for other screening for malignant neoplasm of breast: Secondary | ICD-10-CM

## 2022-10-25 DIAGNOSIS — Z9189 Other specified personal risk factors, not elsewhere classified: Secondary | ICD-10-CM

## 2022-10-25 MED ORDER — GADOPICLENOL 0.5 MMOL/ML IV SOLN
7.0000 mL | Freq: Once | INTRAVENOUS | Status: AC | PRN
  Administered 2022-10-25: 7 mL via INTRAVENOUS

## 2022-11-01 ENCOUNTER — Inpatient Hospital Stay: Payer: Medicare PPO | Attending: Hematology and Oncology | Admitting: Hematology and Oncology

## 2022-11-01 VITALS — BP 154/58 | HR 76 | Temp 97.7°F | Resp 16 | Wt 158.3 lb

## 2022-11-01 DIAGNOSIS — N6092 Unspecified benign mammary dysplasia of left breast: Secondary | ICD-10-CM | POA: Insufficient documentation

## 2022-11-01 DIAGNOSIS — Z79810 Long term (current) use of selective estrogen receptor modulators (SERMs): Secondary | ICD-10-CM | POA: Diagnosis not present

## 2022-11-01 MED ORDER — TAMOXIFEN CITRATE 20 MG PO TABS: 20.0000 mg | ORAL_TABLET | Freq: Every day | ORAL | 3 refills | Status: AC

## 2022-11-01 NOTE — Progress Notes (Signed)
[facility]  [phone] [phone]    ID: [name] DOB: May 11, 1950  MR#: [id number]  CSN#:[id number]  Patient Care Team: Rodrigo Ran, MD as PCP - General (Internal Medicine) Sharrell Ku, MD as Consulting Physician (Gastroenterology) Lunette Stands, MD as Consulting Physician (Orthopedic Surgery) Janalyn Harder, MD (Inactive) as Consulting Physician (Dermatology) Freddy Finner, MD (Inactive) as Consulting Physician (Obstetrics and Gynecology) Griselda Miner, MD as Consulting Physician (General Surgery)   CHIEF COMPLAINT: Breast cancer high risk (atypical lobular hyperplasia).  CURRENT TREATMENT: tamoxifen; intensified screening  INTERVAL HISTORY:  [name] returns today for follow up of her high risk for breast cancer. Since last visit, she continues on tamoxifen for breast cancer prevention.    The patient, with a history of breast cancer, presents for a follow-up visit. She reports adherence to tamoxifen therapy without any new problems. She denies leg swelling and vaginal bleeding.  In addition to the breast cancer follow-up, the patient has had several urinary tract infections (UTIs) recently. In the meantime, she has started a self-directed protocol involving D-mannose and other supplements, which she believes has prevented further UTIs for the past month.   COVID 19 VACCINATION STATUS: Pfizer x3 as of October 2022   HISTORY OF CURRENT ILLNESS:  From the original intake note:  "[name]" had routine screening mammography at Dr. Donnetta Hail clinic showing a possible assymetry in the left breast. She underwent unilateral left diagnostic mammography with tomography and left breast ultrasonography at [facility] on 03/11/2018 showing: Breast Density Category B. Spot compression tomosynthesis images through the region of concern in the superior left breast demonstrates a persistent asymmetry measuring approximately 0.9 cm. There is no associated  distortion or suspicious calcifications. Ultrasound of the left breast at 11 o'clock, 7 cm from the nipple demonstrates an irregular hypoechoic mass measuring 0.6 x 0.4 x 0.5 cm. Ultrasound of the left axilla demonstrates multiple normal-appearing lymph nodes.  Accordingly on 03/12/2018 she proceeded to biopsy of the left breast area in question. The pathology from this procedure showed ([id number]): Flat epithelial atypia with background columnar cell hyperplasia, 11 o'clock.  This was felt to be discordant.  She underwent a left lumpectomy and a laparoscopic cholecystectomy on 05/30/2018. The pathology from this procedure showed ([id number]): 1. Breast, lumpectomy, left with radioactive seed - Biopsy site changes. Columnar cell hyperplasia. Flat epithelial atypia. Lobular neoplasia (atypical lobular hyperplasia) 2. Gallbladder   - Chronic cholecystitis with cholelithiasis  The patient's subsequent history is as detailed below.   PAST MEDICAL HISTORY: Past Medical History:  Diagnosis Date   Allergy    Anemia    Asthma    Depression    GERD (gastroesophageal reflux disease)    Headache    Palpitations 2016   Cardiolite neg 2005, echo 2011 EF 55%  Gastroparesis possibly secondary to remote pyloric stenosis correction   PAST SURGICAL HISTORY: Past Surgical History:  Procedure Laterality Date   ABDOMINAL HYSTERECTOMY     AIKEN OSTEOTOMY Right 02/23/2022   Procedure: Caro Laroche;  Surgeon: Ferman Hamming, DPM;  Location: [facility];  Service: Podiatry;  Laterality: Right;   BREAST BIOPSY Left    BREAST LUMPECTOMY Left 05/2018   abnormal cells   BREAST LUMPECTOMY WITH RADIOACTIVE SEED LOCALIZATION Left 05/30/2018   Procedure: LEFT BREAST LUMPECTOMY WITH RADIOACTIVE SEED LOCALIZATION;  Surgeon: Griselda Miner, MD;  Location: [facility] OR;  Service: General;  Laterality: Left;   BUNIONECTOMY     BUNIONECTOMY Right 02/23/2022   Procedure: Arbutus Leas;  Surgeon: Ferman Hamming,  DPM;  Location: [facility];  Service: Podiatry;  Laterality: Right;   CESAREAN SECTION     CHOLECYSTECTOMY N/A 05/30/2018   Procedure: LAPAROSCOPIC CHOLECYSTECTOMY WITH INTRAOPERATIVE CHOLANGIOGRAM;  Surgeon: Griselda Miner, MD;  Location: [facility] OR;  Service: General;  Laterality: N/A;   HAMMER TOE SURGERY Right 02/23/2022   Procedure: HAMMER TOE REPAIR DIGITS 2 - 5;  Surgeon: Ferman Hamming, DPM;  Location: [facility];  Service: Podiatry;  Laterality: Right;   METATARSAL OSTEOTOMY Right 02/23/2022   Procedure: CLOSING BASE WEDGE OSTEOTOMY - 1ST METATARSAL - PARTIAL HOLE DISTAL OSTEOTOMY = 1ST METATARSAL;  Surgeon: Ferman Hamming, DPM;  Location: [facility];  Service: Podiatry;  Laterality: Right;   pyloric stenosis     at 50 weeks old   REPAIR EXTENSOR TENDON Right 02/23/2022   Procedure: LENGTHENING  OF EXTENSOR TENDON DIGITS 2 -5 OF RIGHT FOOT;  Surgeon: Ferman Hamming, DPM;  Location: [facility];  Service: Podiatry;  Laterality: Right;   TEMPOROMANDIBULAR JOINT SURGERY     VARICOSE VEIN SURGERY     WEIL OSTEOTOMY Right 02/23/2022   Procedure: WEIL OSTEOTOMY OF METATARSALS 2;  Surgeon: Ferman Hamming, DPM;  Location: [facility];  Service: Podiatry;  Laterality: Right;  Bilateral salpingo-oophorectomy   FAMILY HISTORY: Family History  Problem Relation Age of Onset   Hypertension Mother    Cancer Mother        Uterine and Breast   Cerebral aneurysm Mother    Breast cancer Mother    Arthritis Father    Hypertension Father    Glaucoma Father   [name]'s father died from Alzheimer's disease at age 67.  He had a history of bladder cancer.  Patients' mother is living at the age of 41 as of August 2020.   She was diagnosed with breast cancer in her 86s and also uterine cancer. The patient has 1 sister, no brothers. Patient denies anyone in her family having ovarian, prostate, or pancreatic cancer.    GYNECOLOGIC HISTORY:  No LMP recorded. Patient has had a hysterectomy. 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 Her husband, Amada Jupiter, is a IT trainer. Her son, Maurine Minister, lives in [place], is an Airline pilot, is married and has 2 children. Her daughter, Sheria Lang, is an Production designer, theatre/television/film that works with farmer education [place].  She has 1 child and 1 on the way. [name] belongs to a [employer].    ADVANCED DIRECTIVES: In the absence of any documentation, [name]'s spouse, Amada Jupiter, is automatically her healthcare power of attorney.      HEALTH MAINTENANCE: Social History   Tobacco Use   Smoking status: Never   Smokeless tobacco: Never  Vaping Use   Vaping status: Never Used  Substance Use Topics   Alcohol use: No   Drug use: No    Colonoscopy: 2017/[facility]  PAP: Status post hysterectomy  Bone density: Due in 09/2018 Mammography: Up to date  Allergies  Allergen Reactions   Fish Allergy Hives   Metoclopramide Hcl Other (See Comments)    Facial tightness    Penicillins Hives    Did it involve swelling of the face/tongue/throat, SOB, or low BP? No Did it involve sudden or severe rash/hives, skin peeling, or any reaction on the inside of your mouth or nose? No Did you need to seek  medical attention at a hospital or doctor's office? No When did it last happen? ~40 years ago If all above answers are "NO", may proceed with cephalosporin use.    Shellfish Allergy Hives   Sulfamethoxazole-Trimethoprim Hives   Ciprofloxacin Palpitations   Nickel Rash    Current Outpatient Medications  Medication Sig Dispense Refill   acetaminophen (TYLENOL) 500 MG tablet Take 1,000 mg by mouth every 6 (six) hours as needed (pain.).     albuterol (PROAIR HFA) 108 (90 BASE) MCG/ACT inhaler Inhale 1-2 puffs into the lungs every 4 (four) hours as needed. (Patient taking differently: Inhale 1-2 puffs into the lungs every 4 (four) hours  as needed (asthma/wheezing/shortness of breath.).) 18 g 3   Cholecalciferol (VITAMIN D-3) 125 MCG (5000 UT) TABS Take 5,000 Units by mouth at bedtime.     Cinnamon 500 MG capsule Take 500 mg by mouth every evening.      clobetasol (TEMOVATE) 0.05 % external solution APPLY TOPICALLY TO THE AFFECTED AREA AT BEDTIME (Patient taking differently: Apply 1 Application topically at bedtime as needed (irritation).) 50 mL 4   clobetasol ointment (TEMOVATE) 0.05 % Apply 1 application topically 2 (two) times daily. (Patient taking differently: Apply 1 application  topically 2 (two) times daily as needed (irritation).) 30 g 6   Dapsone 7.5 % GEL APPLY TO FACE EVERY NIGHT AT BEDTIME AS DIRECTED (Patient taking differently: Apply 1 Application topically at bedtime as needed (acne).) 90 g 1   desonide (DESOWEN) 0.05 % ointment desonide 0.05 % topical ointment (Patient taking differently: Apply 1 Application topically daily as needed (irritation). desonide 0.05 % topical ointment) 60 g 9   estradiol (ESTRACE) 0.1 MG/GM vaginal cream Place 0.5 g vaginally 2 (two) times a week.     famotidine (PEPCID) 20 MG tablet Take 20 mg by mouth 2 (two) times daily.     fexofenadine (ALLEGRA) 180 MG tablet Take 180 mg by mouth at bedtime.     Flaxseed, Linseed, (FLAX SEED OIL) 1000 MG CAPS Take 1,000 mg by mouth at bedtime.     Fluorouracil (TOLAK) 4 % CREA Aaa qhs 25 to 28 applications (Patient taking differently: Apply 1 Application topically at bedtime as needed (irritation). Aaa qhs 25 to 28 applications) 40 g 1   hydrochlorothiazide (HYDRODIURIL) 25 MG tablet Take 25 mg by mouth daily as needed (fluid retention.).     magnesium oxide (MAG-OX) 400 MG tablet Take 400 mg by mouth every evening.      montelukast (SINGULAIR) 10 MG tablet take 1 tablet by mouth once daily (Patient taking differently: Take 10 mg by mouth at bedtime.) 90 tablet 3   Multiple Vitamins-Minerals (MULTIVITAMIN ADULTS 50+ PO) Take 1 tablet by mouth daily.      nystatin-triamcinolone ointment (MYCOLOG) Apply 1 Application topically daily as needed (irritation).     polycarbophil (FIBERCON) 625 MG tablet Take 625 mg by mouth as needed for moderate constipation.     Probiotic Product (PROBIOTIC PO) Take 1 capsule by mouth daily.     SPIRIVA HANDIHALER 18 MCG inhalation capsule inhale THE CONTENTS OF ONE CAPSULE IN THE HANDIHALER once daily (Patient taking differently: Place 18 mcg into inhaler and inhale daily.) 30 capsule 0   SYSTANE 0.4-0.3 % SOLN Place 1 drop into both eyes in the morning and at bedtime.     tamoxifen (NOLVADEX) 20 MG tablet Take 1 tablet (20 mg total) by mouth daily. 90 tablet 2   TURMERIC PO Take 1,000 mg by mouth at bedtime.  verapamil (VERELAN PM) 180 MG 24 hr capsule Take 180 mg by mouth at bedtime.     vitamin B-12 (CYANOCOBALAMIN) 1000 MCG tablet Take 1,000 mcg by mouth at bedtime.      No current facility-administered medications for this visit.    OBJECTIVE: White woman who appears younger than stated age  Vitals:   11/01/22 0951  BP: (!) 154/58  Pulse: 76  Resp: 16  Temp: 97.7 F (36.5 C)  SpO2: 99%      Body mass index is 27.17 kg/m.   Wt Readings from Last 3 Encounters:  11/01/22 158 lb 4.8 oz (71.8 kg)  02/23/22 157 lb 10.1 oz (71.5 kg)  02/21/22 157 lb 10.1 oz (71.5 kg)      ECOG FS:1 - Symptomatic but completely ambulatory  Physical Exam Constitutional:      Appearance: Normal appearance.  Chest:     Comments: Bilateral breasts inspected.  No palpable masses or regional adenopathy Musculoskeletal:     Cervical back: Normal range of motion and neck supple. No rigidity.  Lymphadenopathy:     Cervical: No cervical adenopathy.  Skin:    General: Skin is warm and dry.  Neurological:     General: No focal deficit present.     Mental Status: She is alert.     LAB RESULTS:  CMP     Component Value Date/Time   NA 135 02/21/2022 1430   K 3.7 02/21/2022 1430   CL 103 02/21/2022 1430    CO2 26 02/21/2022 1430   GLUCOSE 109 (H) 02/21/2022 1430   BUN 14 02/21/2022 1430   CREATININE 0.92 02/21/2022 1430   CREATININE 0.90 02/25/2015 1042   CALCIUM 8.5 (L) 02/21/2022 1430   PROT 7.2 05/22/2018 1042   ALBUMIN 3.8 05/22/2018 1042   AST 22 05/22/2018 1042   ALT 16 05/22/2018 1042   ALKPHOS 63 05/22/2018 1042   BILITOT 0.5 05/22/2018 1042   GFRNONAA >60 02/21/2022 1430   GFRNONAA 68 02/25/2015 1042   GFRAA >60 05/22/2018 1042   GFRAA 78 02/25/2015 1042    No results found for: "TOTALPROTELP", "ALBUMINELP", "A1GS", "A2GS", "BETS", "BETA2SER", "GAMS", "MSPIKE", "SPEI"  No results found for: "KPAFRELGTCHN", "LAMBDASER", "KAPLAMBRATIO"  Lab Results  Component Value Date   WBC 7.5 02/21/2022   NEUTROABS 4.6 02/21/2022   HGB 11.1 (L) 02/21/2022   HCT 33.4 (L) 02/21/2022   MCV 92.5 02/21/2022   PLT 295 02/21/2022    No results found for: "LABCA2"  No components found for: "BZJIRC789"  No results for input(s): "INR" in the last 168 hours.  No results found for: "LABCA2"  No results found for: "FYB017"  No results found for: "CAN125"  No results found for: "CAN153"  No results found for: "CA2729"  No components found for: "HGQUANT"  No results found for: "CEA1", "CEA" / No results found for: "CEA1", "CEA"   No results found for: "AFPTUMOR"  No results found for: "CHROMOGRNA"  No results found for: "TOTALPROTELP", "ALBUMINELP", "A1GS", "A2GS", "BETS", "BETA2SER", "GAMS", "MSPIKE", "SPEI" (this displays SPEP labs)  No results found for: "KPAFRELGTCHN", "LAMBDASER", "KAPLAMBRATIO" (kappa/lambda light chains)  No results found for: "HGBA", "HGBA2QUANT", "HGBFQUANT", "HGBSQUAN" (Hemoglobinopathy evaluation)   No results found for: "LDH"  Lab Results  Component Value Date   IRON 81 08/17/2014   TIBC 341 08/17/2014   IRONPCTSAT 24 08/17/2014   (Iron and TIBC)  Lab Results  Component Value Date   FERRITIN 21 08/17/2014    Urinalysis     Component  Value Date/Time   COLORURINE YELLOW 09/20/2014 1143   APPEARANCEUR CLEAR 09/20/2014 1143   LABSPEC 1.019 09/20/2014 1143   PHURINE 6.5 09/20/2014 1143   GLUCOSEU NEGATIVE 09/20/2014 1143   HGBUR NEGATIVE 09/20/2014 1143   BILIRUBINUR negative 02/12/2015 1457   KETONESUR negative 02/12/2015 1457   KETONESUR NEGATIVE 09/20/2014 1143   PROTEINUR negative 02/12/2015 1457   PROTEINUR NEGATIVE 09/20/2014 1143   UROBILINOGEN 0.2 02/12/2015 1457   UROBILINOGEN 0.2 05/11/2013 1029   NITRITE Negative 02/12/2015 1457   NITRITE NEGATIVE 09/20/2014 1143   LEUKOCYTESUR small (1+) (A) 02/12/2015 1457    STUDIES:  MR BREAST BILATERAL W WO CONTRAST INC CAD  Result Date: 10/25/2022 CLINICAL DATA:  72 year old female presenting for high risk screening MRI. She has history of excisional biopsy of the left breast in May of 2020 showing flat epithelial atypia and AP typical lobular hyperplasia. She has family history of breast cancer in her mother diagnosed in her early 42s. EXAM: BILATERAL BREAST MRI WITH AND WITHOUT CONTRAST TECHNIQUE: Multiplanar, multisequence MR images of both breasts were obtained prior to and following the intravenous administration of 7 ml of Vueway Three-dimensional MR images were rendered by post-processing of the original MR data on an independent workstation. The three-dimensional MR images were interpreted, and findings are reported in the following complete MRI report for this study. Three dimensional images were evaluated at the independent interpreting workstation using the DynaCAD thin client. COMPARISON:  Previous exam(s). FINDINGS: Breast composition: b. Scattered fibroglandular tissue. Background parenchymal enhancement: Minimal Right breast: No mass or abnormal enhancement. Left breast: No mass or abnormal enhancement. Lymph nodes: No abnormal appearing lymph nodes. Ancillary findings:  None. IMPRESSION: 1.  No MRI evidence of malignancy in the bilateral breasts.  RECOMMENDATION: 1.  Annual screening mammography in April of 2025. 2.  High risk screening MRI in 1 year. BI-RADS CATEGORY  1: Negative. Electronically Signed   By: Frederico Hamman M.D.   On: 10/25/2022 13:38      ELIGIBLE FOR AVAILABLE RESEARCH PROTOCOL: no   ASSESSMENT: 72 y.o. [place], [place] woman status post left lumpectomy 05/30/2018 showing atypical lobular hyperplasia --lifetime breast cancer risk 20-25%  (1) risk reduction: Tamoxifen started 08/04/2018  (2) intensified screening:  (a) breast MRI yearly  (b) mammography 6 months apart from breast MRI  (c) biannual breast exam   PLAN:  Patient arrived for follow-up today.  She has been taking tamoxifen as prescribed.  She would like to continue intensified screening.    Breast Cancer On Tamoxifen with no new side effects. No leg swelling or vaginal bleeding. No changes noted in breasts on self-examination. -Refill Tamoxifen prescription. -Continue current dose of Tamoxifen. -Return for mammogram in April and MRI in October. FU in 1 yr with [place].  Recurrent Urinary Tract Infections Multiple recent UTIs. Referred to urologist with appointment in December. Started self on Ecoza protocol with D-mannose and has not had a UTI since starting this regimen. -Continue Ecoza protocol as it seems to be effective. -Follow up with urologist in December.   Total encounter time: 20 minutes *Total Encounter Time as defined by the Centers for Medicare and Medicaid Services includes, in addition to the face-to-face time of a patient visit (documented in the note above) non-face-to-face time: obtaining and reviewing outside history, ordering and reviewing medications, tests or procedures, care coordination (communications with other health care professionals or caregivers) and documentation in the medical record.

## 2022-11-05 DIAGNOSIS — L218 Other seborrheic dermatitis: Secondary | ICD-10-CM | POA: Diagnosis not present

## 2022-11-05 DIAGNOSIS — L309 Dermatitis, unspecified: Secondary | ICD-10-CM | POA: Diagnosis not present

## 2022-11-05 DIAGNOSIS — D2261 Melanocytic nevi of right upper limb, including shoulder: Secondary | ICD-10-CM | POA: Diagnosis not present

## 2022-11-05 DIAGNOSIS — L570 Actinic keratosis: Secondary | ICD-10-CM | POA: Diagnosis not present

## 2022-11-05 DIAGNOSIS — B078 Other viral warts: Secondary | ICD-10-CM | POA: Diagnosis not present

## 2022-11-05 DIAGNOSIS — L820 Inflamed seborrheic keratosis: Secondary | ICD-10-CM | POA: Diagnosis not present

## 2022-11-05 DIAGNOSIS — D485 Neoplasm of uncertain behavior of skin: Secondary | ICD-10-CM | POA: Diagnosis not present

## 2022-11-07 DIAGNOSIS — Z6825 Body mass index (BMI) 25.0-25.9, adult: Secondary | ICD-10-CM | POA: Diagnosis not present

## 2022-11-07 DIAGNOSIS — N390 Urinary tract infection, site not specified: Secondary | ICD-10-CM | POA: Diagnosis not present

## 2022-11-07 DIAGNOSIS — Z779 Other contact with and (suspected) exposures hazardous to health: Secondary | ICD-10-CM | POA: Diagnosis not present

## 2022-11-07 DIAGNOSIS — N6092 Unspecified benign mammary dysplasia of left breast: Secondary | ICD-10-CM | POA: Diagnosis not present

## 2022-11-07 DIAGNOSIS — N760 Acute vaginitis: Secondary | ICD-10-CM | POA: Diagnosis not present

## 2022-11-07 DIAGNOSIS — N762 Acute vulvitis: Secondary | ICD-10-CM | POA: Diagnosis not present

## 2022-11-07 DIAGNOSIS — Z803 Family history of malignant neoplasm of breast: Secondary | ICD-10-CM | POA: Diagnosis not present

## 2022-11-07 DIAGNOSIS — Z1272 Encounter for screening for malignant neoplasm of vagina: Secondary | ICD-10-CM | POA: Diagnosis not present

## 2022-12-07 DIAGNOSIS — J069 Acute upper respiratory infection, unspecified: Secondary | ICD-10-CM | POA: Diagnosis not present

## 2022-12-07 DIAGNOSIS — R0981 Nasal congestion: Secondary | ICD-10-CM | POA: Diagnosis not present

## 2022-12-07 DIAGNOSIS — Z1152 Encounter for screening for COVID-19: Secondary | ICD-10-CM | POA: Diagnosis not present

## 2022-12-07 DIAGNOSIS — J45909 Unspecified asthma, uncomplicated: Secondary | ICD-10-CM | POA: Diagnosis not present

## 2022-12-07 DIAGNOSIS — G43909 Migraine, unspecified, not intractable, without status migrainosus: Secondary | ICD-10-CM | POA: Diagnosis not present

## 2022-12-07 DIAGNOSIS — R051 Acute cough: Secondary | ICD-10-CM | POA: Diagnosis not present

## 2022-12-07 DIAGNOSIS — J029 Acute pharyngitis, unspecified: Secondary | ICD-10-CM | POA: Diagnosis not present

## 2022-12-14 DIAGNOSIS — N3946 Mixed incontinence: Secondary | ICD-10-CM | POA: Diagnosis not present

## 2022-12-14 DIAGNOSIS — N3021 Other chronic cystitis with hematuria: Secondary | ICD-10-CM | POA: Diagnosis not present

## 2023-01-04 DIAGNOSIS — R151 Fecal smearing: Secondary | ICD-10-CM | POA: Diagnosis not present

## 2023-01-04 DIAGNOSIS — K5900 Constipation, unspecified: Secondary | ICD-10-CM | POA: Diagnosis not present

## 2023-01-04 DIAGNOSIS — N3946 Mixed incontinence: Secondary | ICD-10-CM | POA: Diagnosis not present

## 2023-01-04 DIAGNOSIS — M62838 Other muscle spasm: Secondary | ICD-10-CM | POA: Diagnosis not present

## 2023-01-04 DIAGNOSIS — M6281 Muscle weakness (generalized): Secondary | ICD-10-CM | POA: Diagnosis not present

## 2023-01-07 DIAGNOSIS — K602 Anal fissure, unspecified: Secondary | ICD-10-CM | POA: Diagnosis not present

## 2023-01-07 DIAGNOSIS — R151 Fecal smearing: Secondary | ICD-10-CM | POA: Diagnosis not present

## 2023-01-07 DIAGNOSIS — K5903 Drug induced constipation: Secondary | ICD-10-CM | POA: Diagnosis not present

## 2023-01-07 DIAGNOSIS — L309 Dermatitis, unspecified: Secondary | ICD-10-CM | POA: Diagnosis not present

## 2023-01-10 DIAGNOSIS — M6281 Muscle weakness (generalized): Secondary | ICD-10-CM | POA: Diagnosis not present

## 2023-01-10 DIAGNOSIS — R151 Fecal smearing: Secondary | ICD-10-CM | POA: Diagnosis not present

## 2023-01-10 DIAGNOSIS — M62838 Other muscle spasm: Secondary | ICD-10-CM | POA: Diagnosis not present

## 2023-01-10 DIAGNOSIS — N3946 Mixed incontinence: Secondary | ICD-10-CM | POA: Diagnosis not present

## 2023-01-25 DIAGNOSIS — N3946 Mixed incontinence: Secondary | ICD-10-CM | POA: Diagnosis not present

## 2023-01-25 DIAGNOSIS — R151 Fecal smearing: Secondary | ICD-10-CM | POA: Diagnosis not present

## 2023-01-25 DIAGNOSIS — M6281 Muscle weakness (generalized): Secondary | ICD-10-CM | POA: Diagnosis not present

## 2023-01-25 DIAGNOSIS — M62838 Other muscle spasm: Secondary | ICD-10-CM | POA: Diagnosis not present

## 2023-01-29 DIAGNOSIS — K5900 Constipation, unspecified: Secondary | ICD-10-CM | POA: Diagnosis not present

## 2023-01-29 DIAGNOSIS — N3946 Mixed incontinence: Secondary | ICD-10-CM | POA: Diagnosis not present

## 2023-01-29 DIAGNOSIS — R151 Fecal smearing: Secondary | ICD-10-CM | POA: Diagnosis not present

## 2023-01-29 DIAGNOSIS — M6281 Muscle weakness (generalized): Secondary | ICD-10-CM | POA: Diagnosis not present

## 2023-02-05 DIAGNOSIS — H6122 Impacted cerumen, left ear: Secondary | ICD-10-CM | POA: Diagnosis not present

## 2023-02-05 DIAGNOSIS — J069 Acute upper respiratory infection, unspecified: Secondary | ICD-10-CM | POA: Diagnosis not present

## 2023-02-14 DIAGNOSIS — N3946 Mixed incontinence: Secondary | ICD-10-CM | POA: Diagnosis not present

## 2023-02-14 DIAGNOSIS — M6281 Muscle weakness (generalized): Secondary | ICD-10-CM | POA: Diagnosis not present

## 2023-02-14 DIAGNOSIS — K5900 Constipation, unspecified: Secondary | ICD-10-CM | POA: Diagnosis not present

## 2023-02-18 DIAGNOSIS — K602 Anal fissure, unspecified: Secondary | ICD-10-CM | POA: Diagnosis not present

## 2023-02-18 DIAGNOSIS — K5903 Drug induced constipation: Secondary | ICD-10-CM | POA: Diagnosis not present

## 2023-02-18 DIAGNOSIS — R151 Fecal smearing: Secondary | ICD-10-CM | POA: Diagnosis not present

## 2023-02-21 DIAGNOSIS — K5900 Constipation, unspecified: Secondary | ICD-10-CM | POA: Diagnosis not present

## 2023-02-21 DIAGNOSIS — N3946 Mixed incontinence: Secondary | ICD-10-CM | POA: Diagnosis not present

## 2023-02-21 DIAGNOSIS — M6281 Muscle weakness (generalized): Secondary | ICD-10-CM | POA: Diagnosis not present

## 2023-02-21 DIAGNOSIS — M62838 Other muscle spasm: Secondary | ICD-10-CM | POA: Diagnosis not present

## 2023-02-28 DIAGNOSIS — K5900 Constipation, unspecified: Secondary | ICD-10-CM | POA: Diagnosis not present

## 2023-02-28 DIAGNOSIS — N3946 Mixed incontinence: Secondary | ICD-10-CM | POA: Diagnosis not present

## 2023-02-28 DIAGNOSIS — R151 Fecal smearing: Secondary | ICD-10-CM | POA: Diagnosis not present

## 2023-02-28 DIAGNOSIS — M6281 Muscle weakness (generalized): Secondary | ICD-10-CM | POA: Diagnosis not present

## 2023-03-07 DIAGNOSIS — M62838 Other muscle spasm: Secondary | ICD-10-CM | POA: Diagnosis not present

## 2023-03-07 DIAGNOSIS — K5900 Constipation, unspecified: Secondary | ICD-10-CM | POA: Diagnosis not present

## 2023-03-07 DIAGNOSIS — N3946 Mixed incontinence: Secondary | ICD-10-CM | POA: Diagnosis not present

## 2023-03-07 DIAGNOSIS — M6281 Muscle weakness (generalized): Secondary | ICD-10-CM | POA: Diagnosis not present

## 2023-03-11 DIAGNOSIS — N3946 Mixed incontinence: Secondary | ICD-10-CM | POA: Diagnosis not present

## 2023-03-11 DIAGNOSIS — N3021 Other chronic cystitis with hematuria: Secondary | ICD-10-CM | POA: Diagnosis not present

## 2023-03-21 DIAGNOSIS — R151 Fecal smearing: Secondary | ICD-10-CM | POA: Diagnosis not present

## 2023-03-21 DIAGNOSIS — K5900 Constipation, unspecified: Secondary | ICD-10-CM | POA: Diagnosis not present

## 2023-03-21 DIAGNOSIS — M6281 Muscle weakness (generalized): Secondary | ICD-10-CM | POA: Diagnosis not present

## 2023-03-21 DIAGNOSIS — N3946 Mixed incontinence: Secondary | ICD-10-CM | POA: Diagnosis not present

## 2023-03-28 DIAGNOSIS — M6281 Muscle weakness (generalized): Secondary | ICD-10-CM | POA: Diagnosis not present

## 2023-03-28 DIAGNOSIS — R151 Fecal smearing: Secondary | ICD-10-CM | POA: Diagnosis not present

## 2023-03-28 DIAGNOSIS — N3946 Mixed incontinence: Secondary | ICD-10-CM | POA: Diagnosis not present

## 2023-04-25 DIAGNOSIS — M6281 Muscle weakness (generalized): Secondary | ICD-10-CM | POA: Diagnosis not present

## 2023-04-25 DIAGNOSIS — N3946 Mixed incontinence: Secondary | ICD-10-CM | POA: Diagnosis not present

## 2023-04-25 DIAGNOSIS — R150 Incomplete defecation: Secondary | ICD-10-CM | POA: Diagnosis not present

## 2023-04-29 ENCOUNTER — Ambulatory Visit
Admission: RE | Admit: 2023-04-29 | Discharge: 2023-04-29 | Disposition: A | Discharge status: Home / Self Care | Payer: Medicare PPO | Source: Ambulatory Visit | Attending: Hematology and Oncology | Admitting: Hematology and Oncology

## 2023-04-29 DIAGNOSIS — Z1231 Encounter for screening mammogram for malignant neoplasm of breast: Secondary | ICD-10-CM | POA: Diagnosis not present

## 2023-04-29 DIAGNOSIS — N6092 Unspecified benign mammary dysplasia of left breast: Secondary | ICD-10-CM

## 2023-05-09 DIAGNOSIS — M62838 Other muscle spasm: Secondary | ICD-10-CM | POA: Diagnosis not present

## 2023-05-09 DIAGNOSIS — R151 Fecal smearing: Secondary | ICD-10-CM | POA: Diagnosis not present

## 2023-05-09 DIAGNOSIS — R150 Incomplete defecation: Secondary | ICD-10-CM | POA: Diagnosis not present

## 2023-05-09 DIAGNOSIS — M6281 Muscle weakness (generalized): Secondary | ICD-10-CM | POA: Diagnosis not present

## 2023-05-21 DIAGNOSIS — K5900 Constipation, unspecified: Secondary | ICD-10-CM | POA: Diagnosis not present

## 2023-05-21 DIAGNOSIS — M62838 Other muscle spasm: Secondary | ICD-10-CM | POA: Diagnosis not present

## 2023-05-21 DIAGNOSIS — N3946 Mixed incontinence: Secondary | ICD-10-CM | POA: Diagnosis not present

## 2023-05-21 DIAGNOSIS — R151 Fecal smearing: Secondary | ICD-10-CM | POA: Diagnosis not present

## 2023-06-21 DIAGNOSIS — L700 Acne vulgaris: Secondary | ICD-10-CM | POA: Diagnosis not present

## 2023-06-21 DIAGNOSIS — L570 Actinic keratosis: Secondary | ICD-10-CM | POA: Diagnosis not present

## 2023-06-21 DIAGNOSIS — L820 Inflamed seborrheic keratosis: Secondary | ICD-10-CM | POA: Diagnosis not present

## 2023-06-21 DIAGNOSIS — D485 Neoplasm of uncertain behavior of skin: Secondary | ICD-10-CM | POA: Diagnosis not present

## 2023-06-27 DIAGNOSIS — N3946 Mixed incontinence: Secondary | ICD-10-CM | POA: Diagnosis not present

## 2023-06-27 DIAGNOSIS — R151 Fecal smearing: Secondary | ICD-10-CM | POA: Diagnosis not present

## 2023-06-27 DIAGNOSIS — M62838 Other muscle spasm: Secondary | ICD-10-CM | POA: Diagnosis not present

## 2023-06-27 DIAGNOSIS — R150 Incomplete defecation: Secondary | ICD-10-CM | POA: Diagnosis not present

## 2023-06-27 DIAGNOSIS — M6281 Muscle weakness (generalized): Secondary | ICD-10-CM | POA: Diagnosis not present

## 2023-07-09 DIAGNOSIS — K5903 Drug induced constipation: Secondary | ICD-10-CM | POA: Diagnosis not present

## 2023-07-09 DIAGNOSIS — K602 Anal fissure, unspecified: Secondary | ICD-10-CM | POA: Diagnosis not present

## 2023-07-09 DIAGNOSIS — R151 Fecal smearing: Secondary | ICD-10-CM | POA: Diagnosis not present

## 2023-07-16 DIAGNOSIS — M6281 Muscle weakness (generalized): Secondary | ICD-10-CM | POA: Diagnosis not present

## 2023-07-16 DIAGNOSIS — R151 Fecal smearing: Secondary | ICD-10-CM | POA: Diagnosis not present

## 2023-07-23 DIAGNOSIS — R151 Fecal smearing: Secondary | ICD-10-CM | POA: Diagnosis not present

## 2023-07-23 DIAGNOSIS — M62838 Other muscle spasm: Secondary | ICD-10-CM | POA: Diagnosis not present

## 2023-07-23 DIAGNOSIS — M6281 Muscle weakness (generalized): Secondary | ICD-10-CM | POA: Diagnosis not present

## 2023-07-23 DIAGNOSIS — R150 Incomplete defecation: Secondary | ICD-10-CM | POA: Diagnosis not present

## 2023-07-30 DIAGNOSIS — R151 Fecal smearing: Secondary | ICD-10-CM | POA: Diagnosis not present

## 2023-07-30 DIAGNOSIS — M62838 Other muscle spasm: Secondary | ICD-10-CM | POA: Diagnosis not present

## 2023-07-30 DIAGNOSIS — M6281 Muscle weakness (generalized): Secondary | ICD-10-CM | POA: Diagnosis not present

## 2023-07-30 DIAGNOSIS — N3946 Mixed incontinence: Secondary | ICD-10-CM | POA: Diagnosis not present

## 2023-07-30 DIAGNOSIS — K5900 Constipation, unspecified: Secondary | ICD-10-CM | POA: Diagnosis not present

## 2023-07-31 DIAGNOSIS — R600 Localized edema: Secondary | ICD-10-CM | POA: Diagnosis not present

## 2023-07-31 DIAGNOSIS — M7989 Other specified soft tissue disorders: Secondary | ICD-10-CM | POA: Diagnosis not present

## 2023-07-31 DIAGNOSIS — M79661 Pain in right lower leg: Secondary | ICD-10-CM | POA: Diagnosis not present

## 2023-07-31 DIAGNOSIS — R252 Cramp and spasm: Secondary | ICD-10-CM | POA: Diagnosis not present

## 2023-07-31 DIAGNOSIS — M79662 Pain in left lower leg: Secondary | ICD-10-CM | POA: Diagnosis not present

## 2023-07-31 DIAGNOSIS — I83813 Varicose veins of bilateral lower extremities with pain: Secondary | ICD-10-CM | POA: Diagnosis not present

## 2023-08-15 DIAGNOSIS — N3946 Mixed incontinence: Secondary | ICD-10-CM | POA: Diagnosis not present

## 2023-08-15 DIAGNOSIS — K5900 Constipation, unspecified: Secondary | ICD-10-CM | POA: Diagnosis not present

## 2023-08-15 DIAGNOSIS — R151 Fecal smearing: Secondary | ICD-10-CM | POA: Diagnosis not present

## 2023-08-20 DIAGNOSIS — D649 Anemia, unspecified: Secondary | ICD-10-CM | POA: Diagnosis not present

## 2023-08-20 DIAGNOSIS — E785 Hyperlipidemia, unspecified: Secondary | ICD-10-CM | POA: Diagnosis not present

## 2023-08-20 DIAGNOSIS — Z1212 Encounter for screening for malignant neoplasm of rectum: Secondary | ICD-10-CM | POA: Diagnosis not present

## 2023-08-20 DIAGNOSIS — N1831 Chronic kidney disease, stage 3a: Secondary | ICD-10-CM | POA: Diagnosis not present

## 2023-08-20 DIAGNOSIS — E7849 Other hyperlipidemia: Secondary | ICD-10-CM | POA: Diagnosis not present

## 2023-08-26 DIAGNOSIS — M62838 Other muscle spasm: Secondary | ICD-10-CM | POA: Diagnosis not present

## 2023-08-26 DIAGNOSIS — R151 Fecal smearing: Secondary | ICD-10-CM | POA: Diagnosis not present

## 2023-08-26 DIAGNOSIS — R150 Incomplete defecation: Secondary | ICD-10-CM | POA: Diagnosis not present

## 2023-08-26 DIAGNOSIS — M6281 Muscle weakness (generalized): Secondary | ICD-10-CM | POA: Diagnosis not present

## 2023-08-27 DIAGNOSIS — K219 Gastro-esophageal reflux disease without esophagitis: Secondary | ICD-10-CM | POA: Diagnosis not present

## 2023-08-27 DIAGNOSIS — D649 Anemia, unspecified: Secondary | ICD-10-CM | POA: Diagnosis not present

## 2023-08-27 DIAGNOSIS — J309 Allergic rhinitis, unspecified: Secondary | ICD-10-CM | POA: Diagnosis not present

## 2023-08-27 DIAGNOSIS — R82998 Other abnormal findings in urine: Secondary | ICD-10-CM | POA: Diagnosis not present

## 2023-08-27 DIAGNOSIS — M2041 Other hammer toe(s) (acquired), right foot: Secondary | ICD-10-CM | POA: Diagnosis not present

## 2023-08-27 DIAGNOSIS — Z0000 Encounter for general adult medical examination without abnormal findings: Secondary | ICD-10-CM | POA: Diagnosis not present

## 2023-08-27 DIAGNOSIS — R000 Tachycardia, unspecified: Secondary | ICD-10-CM | POA: Diagnosis not present

## 2023-08-27 DIAGNOSIS — N1831 Chronic kidney disease, stage 3a: Secondary | ICD-10-CM | POA: Diagnosis not present

## 2023-08-27 DIAGNOSIS — J45909 Unspecified asthma, uncomplicated: Secondary | ICD-10-CM | POA: Diagnosis not present

## 2023-09-04 DIAGNOSIS — N3946 Mixed incontinence: Secondary | ICD-10-CM | POA: Diagnosis not present

## 2023-09-04 DIAGNOSIS — M62838 Other muscle spasm: Secondary | ICD-10-CM | POA: Diagnosis not present

## 2023-09-04 DIAGNOSIS — R151 Fecal smearing: Secondary | ICD-10-CM | POA: Diagnosis not present

## 2023-09-04 DIAGNOSIS — M6281 Muscle weakness (generalized): Secondary | ICD-10-CM | POA: Diagnosis not present

## 2023-10-16 DIAGNOSIS — M67949 Unspecified disorder of synovium and tendon, unspecified hand: Secondary | ICD-10-CM | POA: Diagnosis not present

## 2023-10-17 DIAGNOSIS — N3941 Urge incontinence: Secondary | ICD-10-CM | POA: Diagnosis not present

## 2023-10-25 DIAGNOSIS — M67441 Ganglion, right hand: Secondary | ICD-10-CM | POA: Diagnosis not present

## 2023-10-25 DIAGNOSIS — M20011 Mallet finger of right finger(s): Secondary | ICD-10-CM | POA: Diagnosis not present

## 2023-10-25 DIAGNOSIS — M151 Heberden's nodes (with arthropathy): Secondary | ICD-10-CM | POA: Diagnosis not present

## 2023-10-30 ENCOUNTER — Ambulatory Visit (HOSPITAL_COMMUNITY)
Admission: RE | Admit: 2023-10-30 | Discharge: 2023-10-30 | Disposition: A | Discharge status: Home / Self Care | Source: Ambulatory Visit | Attending: Hematology and Oncology | Admitting: Hematology and Oncology

## 2023-10-30 DIAGNOSIS — N6092 Unspecified benign mammary dysplasia of left breast: Secondary | ICD-10-CM | POA: Diagnosis present

## 2023-10-30 DIAGNOSIS — Z1239 Encounter for other screening for malignant neoplasm of breast: Secondary | ICD-10-CM | POA: Diagnosis not present

## 2023-10-30 MED ORDER — GADOBUTROL 1 MMOL/ML IV SOLN
7.0000 mL | Freq: Once | INTRAVENOUS | Status: AC | PRN
  Administered 2023-10-30: 7 mL via INTRAVENOUS

## 2023-11-05 ENCOUNTER — Telehealth: Payer: Self-pay

## 2023-11-05 DIAGNOSIS — J029 Acute pharyngitis, unspecified: Secondary | ICD-10-CM | POA: Diagnosis not present

## 2023-11-05 DIAGNOSIS — N1831 Chronic kidney disease, stage 3a: Secondary | ICD-10-CM | POA: Diagnosis not present

## 2023-11-05 DIAGNOSIS — J45909 Unspecified asthma, uncomplicated: Secondary | ICD-10-CM | POA: Diagnosis not present

## 2023-11-05 DIAGNOSIS — J320 Chronic maxillary sinusitis: Secondary | ICD-10-CM | POA: Diagnosis not present

## 2023-11-05 DIAGNOSIS — J309 Allergic rhinitis, unspecified: Secondary | ICD-10-CM | POA: Diagnosis not present

## 2023-11-05 DIAGNOSIS — R051 Acute cough: Secondary | ICD-10-CM | POA: Diagnosis not present

## 2023-11-05 DIAGNOSIS — J069 Acute upper respiratory infection, unspecified: Secondary | ICD-10-CM | POA: Diagnosis not present

## 2023-11-05 NOTE — Telephone Encounter (Signed)
 Spoke with patient and confirmed appointment on 11/4

## 2023-11-06 ENCOUNTER — Inpatient Hospital Stay: Attending: Hematology and Oncology | Admitting: Hematology and Oncology

## 2023-11-06 ENCOUNTER — Encounter: Payer: Self-pay | Admitting: Hematology and Oncology

## 2023-11-06 VITALS — BP 142/57 | HR 76 | Temp 97.9°F | Resp 17 | Wt 161.9 lb

## 2023-11-06 DIAGNOSIS — N6092 Unspecified benign mammary dysplasia of left breast: Secondary | ICD-10-CM

## 2023-11-06 DIAGNOSIS — Z79810 Long term (current) use of selective estrogen receptor modulators (SERMs): Secondary | ICD-10-CM | POA: Diagnosis not present

## 2023-11-06 NOTE — Progress Notes (Signed)
 [facility]  Telephone:(336) [phone] Fax:([phone]) [phone]    ID: MANE CONSOLO DOB: 02-18-1950  MR#: [id number]  RDW#:[id number]  Patient Care Team: Shayne Anes, MD as PCP - General (Internal Medicine) Luis Purchase, MD as Consulting Physician (Gastroenterology) Gaspar Kung, MD as Consulting Physician (Orthopedic Surgery) Livingston Rigg, MD as Consulting Physician (Dermatology) Rosalynn LELON Ingle, MD (Inactive) as Consulting Physician (Obstetrics and Gynecology) Curvin Deward MOULD, MD as Consulting Physician (General Surgery)   CHIEF COMPLAINT: Breast cancer high risk (atypical lobular hyperplasia).  CURRENT TREATMENT: tamoxifen ; intensified screening  INTERVAL HISTORY: Discussed the use of AI scribe software for clinical note transcription with the patient, who gave verbal consent to proceed.  History of Present Illness [name] is a 73 year old female with atypical lobular hyperplasia who presents for follow-up regarding tamoxifen  therapy and breast cancer prevention.  She has been on tamoxifen  since August 2020 for breast cancer prevention due to atypical lobular hyperplasia. She is nearing the end of her five-year course and has a few pills left in her current bottle.  She has been undergoing alternating mammograms and MRIs for monitoring. Her most recent MRI in October was normal, but she experienced an unusual sensation during the contrast administration, feeling it in her chest and having a sensation to cough. This was a new experience for her, and the technicians suggested it might be a reaction.  Her breast density is classified as B on a scale from A to D,  No swelling in her legs currently, although she experiences some swelling during the summer months. Her hemoglobin levels have been monitored, and she has had recent labs done, which included checks for iron levels.    COVID 19 VACCINATION STATUS: Pfizer x3 as of October 2022   HISTORY OF  CURRENT ILLNESS:  From the original intake note:  [name] had routine screening mammography at [facility] showing a possible assymetry in the left breast. She underwent unilateral left diagnostic mammography with tomography and left breast ultrasonography at [facility] on 03/11/2018 showing: Breast Density Category B. Spot compression tomosynthesis images through the region of concern in the superior left breast demonstrates a persistent asymmetry measuring approximately 0.9 cm. There is no associated distortion or suspicious calcifications. Ultrasound of the left breast at 11 o'clock, 7 cm from the nipple demonstrates an irregular hypoechoic mass measuring 0.6 x 0.4 x 0.5 cm. Ultrasound of the left axilla demonstrates multiple normal-appearing lymph nodes.  Accordingly on 03/12/2018 she proceeded to biopsy of the left breast area in question. The pathology from this procedure showed ([id number]): Flat epithelial atypia with background columnar cell hyperplasia, 11 o'clock.  This was felt to be discordant.  She underwent a left lumpectomy and a laparoscopic cholecystectomy on 05/30/2018. The pathology from this procedure showed ([id number]): 1. Breast, lumpectomy, left with radioactive seed - Biopsy site changes. Columnar cell hyperplasia. Flat epithelial atypia. Lobular neoplasia (atypical lobular hyperplasia) 2. Gallbladder   - Chronic cholecystitis with cholelithiasis  The patient's subsequent history is as detailed below.   PAST MEDICAL HISTORY: Past Medical History:  Diagnosis Date   Allergy    Anemia    Asthma    Depression    GERD (gastroesophageal reflux disease)    Headache    Palpitations 2016   Cardiolite neg 2005, echo 2011 EF 55%  Gastroparesis possibly secondary to remote pyloric stenosis correction   PAST SURGICAL HISTORY: Past Surgical History:  Procedure Laterality Date   ABDOMINAL HYSTERECTOMY  KATRINA OSTEOTOMY Right 02/23/2022   Procedure: KATRINA VERDENE ALT;  Surgeon: Blinda Katz, DPM;  Location: [facility];  Service: Podiatry;  Laterality: Right;   BREAST BIOPSY Left    BREAST LUMPECTOMY Left 05/2018   abnormal cells   BREAST LUMPECTOMY WITH RADIOACTIVE SEED LOCALIZATION Left 05/30/2018   Procedure: LEFT BREAST LUMPECTOMY WITH RADIOACTIVE SEED LOCALIZATION;  Surgeon: Curvin Deward MOULD, MD;  Location: [facility] OR;  Service: General;  Laterality: Left;   BUNIONECTOMY     BUNIONECTOMY Right 02/23/2022   Procedure: ROMAYNE;  Surgeon: Blinda Katz, DPM;  Location: [facility];  Service: Podiatry;  Laterality: Right;   CESAREAN SECTION     CHOLECYSTECTOMY N/A 05/30/2018   Procedure: LAPAROSCOPIC CHOLECYSTECTOMY WITH INTRAOPERATIVE CHOLANGIOGRAM;  Surgeon: Curvin Deward MOULD, MD;  Location: [facility] OR;  Service: General;  Laterality: N/A;   HAMMER TOE SURGERY Right 02/23/2022   Procedure: HAMMER TOE REPAIR DIGITS 2 - 5;  Surgeon: Blinda Katz, DPM;  Location: [facility];  Service: Podiatry;  Laterality: Right;   METATARSAL OSTEOTOMY Right 02/23/2022   Procedure: CLOSING BASE WEDGE OSTEOTOMY - 1ST METATARSAL - PARTIAL HOLE DISTAL OSTEOTOMY = 1ST METATARSAL;  Surgeon: Blinda Katz, DPM;  Location: [facility];  Service: Podiatry;  Laterality: Right;   pyloric stenosis     at 79 weeks old   REPAIR EXTENSOR TENDON Right 02/23/2022   Procedure: LENGTHENING  OF EXTENSOR TENDON DIGITS 2 -5 OF RIGHT FOOT;  Surgeon: Blinda Katz, DPM;  Location: [facility];  Service: Podiatry;  Laterality: Right;   TEMPOROMANDIBULAR JOINT SURGERY     VARICOSE VEIN SURGERY     WEIL OSTEOTOMY Right 02/23/2022   Procedure: WEIL OSTEOTOMY OF METATARSALS 2;  Surgeon: Blinda Katz, DPM;  Location: [facility];  Service: Podiatry;  Laterality: Right;  Bilateral salpingo-oophorectomy   FAMILY HISTORY: Family History  Problem Relation Age of Onset   Hypertension Mother    Cancer Mother        Uterine and Breast   Cerebral aneurysm Mother    Breast cancer Mother     Arthritis Father    Hypertension Father    Glaucoma Father   [name]'s father died from Alzheimer's disease at age 32.  He had a history of bladder cancer.  Patients' mother is living at the age of 39 as of August 2020.   She was diagnosed with breast cancer in her 35s and also uterine cancer. The patient has 1 sister, no brothers. Patient denies anyone in her family having ovarian, prostate, or pancreatic cancer.    GYNECOLOGIC HISTORY:  No LMP recorded. Patient has had a hysterectomy. 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 Her husband, Cheryl, is a CPA. Her son, Marinda, lives in [place], is an airline pilot, is married and has 2 children. Her daughter, Ole, is an production designer, theatre/television/film that works with farmer education near [place].  She has 1 child and 1 on the way. [name] belongs to a [employer].    ADVANCED DIRECTIVES: In the absence of any documentation, [name]'s spouse, Cheryl, is automatically her healthcare power of attorney.      HEALTH MAINTENANCE: Social History   Tobacco Use   Smoking status: Never   Smokeless tobacco: Never  Vaping Use   Vaping status: Never Used  Substance Use Topics   Alcohol  use: No  Drug use: No    Colonoscopy: 2017/[facility]  PAP: Status post hysterectomy  Bone density: Due in 09/2018 Mammography: Up to date  Allergies  Allergen Reactions   Fish Allergy Hives   Metoclopramide Hcl Other (See Comments)    Facial tightness    Penicillins Hives    Did it involve swelling of the face/tongue/throat, SOB, or low BP? No Did it involve sudden or severe rash/hives, skin peeling, or any reaction on the inside of your mouth or nose? No Did you need to seek medical attention at a hospital or doctor's office? No When did it last happen? ~40 years ago If all above answers are "NO",  may proceed with cephalosporin use.    Shellfish Allergy Hives   Sulfamethoxazole-Trimethoprim Hives   Ciprofloxacin Palpitations   Gadolinium Derivatives Other (See Comments) and Cough    Pt. Felt contrast in her chest and began coughing and had the sniffles. No rash or other symptoms. No treatment required.    Nickel Rash    Current Outpatient Medications  Medication Sig Dispense Refill   acetaminophen  (TYLENOL ) 500 MG tablet Take 1,000 mg by mouth every 6 (six) hours as needed (pain.).     albuterol  (PROAIR  HFA) 108 (90 BASE) MCG/ACT inhaler Inhale 1-2 puffs into the lungs every 4 (four) hours as needed. (Patient taking differently: Inhale 1-2 puffs into the lungs every 4 (four) hours as needed (asthma/wheezing/shortness of breath.).) 18 g 3   Cholecalciferol (VITAMIN D -3) 125 MCG (5000 UT) TABS Take 5,000 Units by mouth at bedtime.     Cinnamon 500 MG capsule Take 500 mg by mouth every evening.      clobetasol  (TEMOVATE ) 0.05 % external solution APPLY TOPICALLY TO THE AFFECTED AREA AT BEDTIME (Patient taking differently: Apply 1 Application topically at bedtime as needed (irritation).) 50 mL 4   clobetasol  ointment (TEMOVATE ) 0.05 % Apply 1 application topically 2 (two) times daily. (Patient taking differently: Apply 1 application  topically 2 (two) times daily as needed (irritation).) 30 g 6   Dapsone 7.5 % GEL APPLY TO FACE EVERY NIGHT AT BEDTIME AS DIRECTED (Patient taking differently: Apply 1 Application topically at bedtime as needed (acne).) 90 g 1   desonide  (DESOWEN ) 0.05 % ointment desonide  0.05 % topical ointment (Patient taking differently: Apply 1 Application topically daily as needed (irritation). desonide  0.05 % topical ointment) 60 g 9   estradiol (ESTRACE) 0.1 MG/GM vaginal cream Place 0.5 g vaginally 2 (two) times a week.     famotidine (PEPCID) 20 MG tablet Take 20 mg by mouth 2 (two) times daily.     fexofenadine (ALLEGRA) 180 MG tablet Take 180 mg by mouth at bedtime.      Flaxseed, Linseed, (FLAX SEED OIL) 1000 MG CAPS Take 1,000 mg by mouth at bedtime.     Fluorouracil  (TOLAK ) 4 % CREA Aaa qhs 25 to 28 applications (Patient taking differently: Apply 1 Application topically at bedtime as needed (irritation). Aaa qhs 25 to 28 applications) 40 g 1   hydrochlorothiazide  (HYDRODIURIL ) 25 MG tablet Take 25 mg by mouth daily as needed (fluid retention.).     magnesium oxide (MAG-OX) 400 MG tablet Take 400 mg by mouth every evening.      montelukast  (SINGULAIR ) 10 MG tablet take 1 tablet by mouth once daily (Patient taking differently: Take 10 mg by mouth at bedtime.) 90 tablet 3   Multiple Vitamins-Minerals (MULTIVITAMIN ADULTS 50+ PO) Take 1 tablet by mouth daily.     nystatin-triamcinolone  ointment (MYCOLOG) Apply  1 Application topically daily as needed (irritation).     polycarbophil (FIBERCON) 625 MG tablet Take 625 mg by mouth as needed for moderate constipation.     Probiotic Product (PROBIOTIC PO) Take 1 capsule by mouth daily.     SPIRIVA  HANDIHALER 18 MCG inhalation capsule inhale THE CONTENTS OF ONE CAPSULE IN THE HANDIHALER once daily (Patient taking differently: Place 18 mcg into inhaler and inhale daily.) 30 capsule 0   SYSTANE 0.4-0.3 % SOLN Place 1 drop into both eyes in the morning and at bedtime.     tamoxifen  (NOLVADEX ) 20 MG tablet Take 1 tablet (20 mg total) by mouth daily. 90 tablet 3   TURMERIC PO Take 1,000 mg by mouth at bedtime.     verapamil  (VERELAN  PM) 180 MG 24 hr capsule Take 180 mg by mouth at bedtime.     vitamin B-12 (CYANOCOBALAMIN ) 1000 MCG tablet Take 1,000 mcg by mouth at bedtime.      No current facility-administered medications for this visit.    OBJECTIVE: White woman who appears younger than stated age  Vitals:   11/06/23 1406  BP: (!) 142/57  Pulse: 76  Resp: 17  Temp: 97.9 F (36.6 C)  SpO2: 95%      Body mass index is 27.79 kg/m.   Wt Readings from Last 3 Encounters:  11/06/23 161 lb 14.4 oz (73.4 kg)   11/01/22 158 lb 4.8 oz (71.8 kg)  02/23/22 157 lb 10.1 oz (71.5 kg)      ECOG FS:1 - Symptomatic but completely ambulatory  Physical Exam Constitutional:      Appearance: Normal appearance.  Chest:     Comments: Bilateral breasts inspected.  No palpable masses or regional adenopathy Musculoskeletal:     Cervical back: Normal range of motion and neck supple. No rigidity.  Lymphadenopathy:     Cervical: No cervical adenopathy.  Skin:    General: Skin is warm and dry.  Neurological:     General: No focal deficit present.     Mental Status: She is alert.     LAB RESULTS:  CMP     Component Value Date/Time   NA 135 02/21/2022 1430   K 3.7 02/21/2022 1430   CL 103 02/21/2022 1430   CO2 26 02/21/2022 1430   GLUCOSE 109 (H) 02/21/2022 1430   BUN 14 02/21/2022 1430   CREATININE 0.92 02/21/2022 1430   CREATININE 0.90 02/25/2015 1042   CALCIUM 8.5 (L) 02/21/2022 1430   PROT 7.2 05/22/2018 1042   ALBUMIN 3.8 05/22/2018 1042   AST 22 05/22/2018 1042   ALT 16 05/22/2018 1042   ALKPHOS 63 05/22/2018 1042   BILITOT 0.5 05/22/2018 1042   GFRNONAA >60 02/21/2022 1430   GFRNONAA 68 02/25/2015 1042   GFRAA >60 05/22/2018 1042   GFRAA 78 02/25/2015 1042    No results found for: TOTALPROTELP, ALBUMINELP, A1GS, A2GS, BETS, BETA2SER, GAMS, MSPIKE, SPEI  No results found for: KPAFRELGTCHN, LAMBDASER, KAPLAMBRATIO  Lab Results  Component Value Date   WBC 7.5 02/21/2022   NEUTROABS 4.6 02/21/2022   HGB 11.1 (L) 02/21/2022   HCT 33.4 (L) 02/21/2022   MCV 92.5 02/21/2022   PLT 295 02/21/2022    No results found for: LABCA2  No components found for: [id number]  No results for input(s): INR in the last 168 hours.  No results found for: LABCA2  No results found for: RJW800  No results found for: CAN125  No results found for: CAN153  No results found for:  [id number]  No components found for: HGQUANT  No results found for: CEA1,  CEA / No results found for: CEA1, CEA   No results found for: AFPTUMOR  No results found for: CHROMOGRNA  No results found for: TOTALPROTELP, ALBUMINELP, A1GS, A2GS, BETS, BETA2SER, GAMS, MSPIKE, SPEI (this displays SPEP labs)  No results found for: KPAFRELGTCHN, LAMBDASER, KAPLAMBRATIO (kappa/lambda light chains)  No results found for: HGBA, HGBA2QUANT, HGBFQUANT, HGBSQUAN (Hemoglobinopathy evaluation)   No results found for: LDH  Lab Results  Component Value Date   IRON 81 08/17/2014   TIBC 341 08/17/2014   IRONPCTSAT 24 08/17/2014   (Iron and TIBC)  Lab Results  Component Value Date   FERRITIN 21 08/17/2014    Urinalysis    Component Value Date/Time   COLORURINE YELLOW 09/20/2014 1143   APPEARANCEUR CLEAR 09/20/2014 1143   LABSPEC 1.019 09/20/2014 1143   PHURINE 6.5 09/20/2014 1143   GLUCOSEU NEGATIVE 09/20/2014 1143   HGBUR NEGATIVE 09/20/2014 1143   BILIRUBINUR negative 02/12/2015 1457   KETONESUR negative 02/12/2015 1457   KETONESUR NEGATIVE 09/20/2014 1143   PROTEINUR negative 02/12/2015 1457   PROTEINUR NEGATIVE 09/20/2014 1143   UROBILINOGEN 0.2 02/12/2015 1457   UROBILINOGEN 0.2 05/11/2013 1029   NITRITE Negative 02/12/2015 1457   NITRITE NEGATIVE 09/20/2014 1143   LEUKOCYTESUR small (1+) (A) 02/12/2015 1457    STUDIES:  MR BREAST BILATERAL W WO CONTRAST INC CAD Result Date: 10/30/2023 CLINICAL DATA:  High-risk screening MRI. Patient was history of excisional biopsy of the left breast in 2020 demonstrating flat epithelial atypia and atypical lobular hyperplasia. Lifetime risk calculated greater than 30% by TC model. EXAM: BILATERAL BREAST MRI WITH AND WITHOUT CONTRAST TECHNIQUE: Multiplanar, multisequence MR images of both breasts were obtained prior to and following the intravenous administration of 7 ml of Gadavist  Three-dimensional MR images were rendered by post-processing of the original MR data on an  independent workstation. The three-dimensional MR images were interpreted, and findings are reported in the following complete MRI report for this study. Three dimensional images were evaluated at the independent interpreting workstation using the DynaCAD thin client. COMPARISON:  Previous exam(s). FINDINGS: Breast composition: b. Scattered fibroglandular tissue. Background parenchymal enhancement: Minimal Right breast: No mass or abnormal enhancement. Left breast: No mass or abnormal enhancement. Lymph nodes: No abnormal appearing lymph nodes. Ancillary findings:  None. IMPRESSION: No MRI evidence of malignancy in either breast. RECOMMENDATION: 1.  Annual screening mammography in April of 2026 is recommended. 2.  Screening MRI in 1 year is recommended. Based on the recommendations of the American Cancer Society, annual screening MRI is suggested in addition to annual mammography if the patient has an estimated lifetime risk of developing breast cancer which is greater than 20%. BI-RADS CATEGORY  1: Negative. Electronically Signed   By: Dina  Arceo M.D.   On: 10/30/2023 11:20      ELIGIBLE FOR AVAILABLE RESEARCH PROTOCOL: no   ASSESSMENT: 73 y.o. [place], [place] woman status post left lumpectomy 05/30/2018 showing atypical lobular hyperplasia --lifetime breast cancer risk 20-25%  (1) risk reduction: Tamoxifen  started 08/04/2018  (2) intensified screening:  (a) breast MRI yearly  (b) mammography 6 months apart from breast MRI  (c) biannual breast exam   PLAN:  Assessment and Plan Assessment & Plan Atypical lobular hyperplasia of breast, status post five years of tamoxifen  therapy Completed five years of tamoxifen  for breast cancer prevention. Continued benefit noted post-cessation with potential tail effect.  Breast density B, discussed CEM as an alternative to MRI due  to breast density and ALH, no pathogenic mutations.  - Discontinue tamoxifen  after current prescription. - Consider annual  CEM instead of alternating mammograms and MRIs. She wants to think about it. - Ordered mammogram for April. - Ordered MRI for next year.  Possible mild contrast reaction during breast MRI Reported sensation of coughing during recent MRI, ?  possible mild reaction. She has had several MRI and didn't have any issues. The only change is the location of the MRI. - Continue MRI at [facility] if CEM not pursued. She did well with MRI's at breast center.   Total encounter time: 30 minutes *Total Encounter Time as defined by the Centers for Medicare and Medicaid Services includes, in addition to the face-to-face time of a patient visit (documented in the note above) non-face-to-face time: obtaining and reviewing outside history, ordering and reviewing medications, tests or procedures, care coordination (communications with other health care professionals or caregivers) and documentation in the medical record.

## 2023-11-06 NOTE — Progress Notes (Signed)
 [facility]  [phone] [phone]    ID: [name] DOB: 1950/09/19  MR#: [id number]  RDW#:[id number]  Patient Care Team: Shayne Anes, MD as PCP - General (Internal Medicine) Luis Purchase, MD as Consulting Physician (Gastroenterology) Gaspar Kung, MD as Consulting Physician (Orthopedic Surgery) Livingston Rigg, MD as Consulting Physician (Dermatology) Rosalynn LELON Ingle, MD (Inactive) as Consulting Physician (Obstetrics and Gynecology) Curvin Deward MOULD, MD as Consulting Physician (General Surgery)   CHIEF COMPLAINT: Breast cancer high risk (atypical lobular hyperplasia).  CURRENT TREATMENT: tamoxifen ; intensified screening  INTERVAL HISTORY:  [name] returns today for follow up of her high risk for breast cancer.   COVID 19 VACCINATION STATUS: Pfizer x3 as of October 2022   HISTORY OF CURRENT ILLNESS:  From the original intake note:  [name] had routine screening mammography at [facility] showing a possible assymetry in the left breast. She underwent unilateral left diagnostic mammography with tomography and left breast ultrasonography at [facility] on 03/11/2018 showing: Breast Density Category B. Spot compression tomosynthesis images through the region of concern in the superior left breast demonstrates a persistent asymmetry measuring approximately 0.9 cm. There is no associated distortion or suspicious calcifications. Ultrasound of the left breast at 11 o'clock, 7 cm from the nipple demonstrates an irregular hypoechoic mass measuring 0.6 x 0.4 x 0.5 cm. Ultrasound of the left axilla demonstrates multiple normal-appearing lymph nodes.  Accordingly on 03/12/2018 she proceeded to biopsy of the left breast area in question. The pathology from this procedure showed ([id number]): Flat epithelial atypia with background columnar cell hyperplasia, 11 o'clock.  This was felt to be discordant.  She underwent a left lumpectomy and a laparoscopic  cholecystectomy on 05/30/2018. The pathology from this procedure showed ([id number]): 1. Breast, lumpectomy, left with radioactive seed - Biopsy site changes. Columnar cell hyperplasia. Flat epithelial atypia. Lobular neoplasia (atypical lobular hyperplasia) 2. Gallbladder   - Chronic cholecystitis with cholelithiasis  The patient's subsequent history is as detailed below.   PAST MEDICAL HISTORY: Past Medical History:  Diagnosis Date   Allergy    Anemia    Asthma    Depression    GERD (gastroesophageal reflux disease)    Headache    Palpitations 2016   Cardiolite neg 2005, echo 2011 EF 55%  Gastroparesis possibly secondary to remote pyloric stenosis correction   PAST SURGICAL HISTORY: Past Surgical History:  Procedure Laterality Date   ABDOMINAL HYSTERECTOMY     AIKEN OSTEOTOMY Right 02/23/2022   Procedure: KATRINA VERDENE ALT;  Surgeon: Blinda Katz, DPM;  Location: [facility];  Service: Podiatry;  Laterality: Right;   BREAST BIOPSY Left    BREAST LUMPECTOMY Left 05/2018   abnormal cells   BREAST LUMPECTOMY WITH RADIOACTIVE SEED LOCALIZATION Left 05/30/2018   Procedure: LEFT BREAST LUMPECTOMY WITH RADIOACTIVE SEED LOCALIZATION;  Surgeon: Curvin Deward MOULD, MD;  Location: [facility] OR;  Service: General;  Laterality: Left;   BUNIONECTOMY     BUNIONECTOMY Right 02/23/2022   Procedure: ROMAYNE;  Surgeon: Blinda Katz, DPM;  Location: [facility];  Service: Podiatry;  Laterality: Right;   CESAREAN SECTION     CHOLECYSTECTOMY N/A 05/30/2018   Procedure: LAPAROSCOPIC CHOLECYSTECTOMY WITH INTRAOPERATIVE CHOLANGIOGRAM;  Surgeon: Curvin Deward MOULD, MD;  Location: [facility] OR;  Service: General;  Laterality: N/A;   HAMMER TOE SURGERY Right 02/23/2022   Procedure: HAMMER TOE REPAIR DIGITS 2 - 5;  Surgeon: Blinda Katz, DPM;  Location: [facility];  Service: Podiatry;  Laterality: Right;   METATARSAL OSTEOTOMY Right 02/23/2022  Procedure: CLOSING BASE WEDGE OSTEOTOMY - 1ST METATARSAL - PARTIAL  HOLE DISTAL OSTEOTOMY = 1ST METATARSAL;  Surgeon: Blinda Katz, DPM;  Location: [facility];  Service: Podiatry;  Laterality: Right;   pyloric stenosis     at 56 weeks old   REPAIR EXTENSOR TENDON Right 02/23/2022   Procedure: LENGTHENING  OF EXTENSOR TENDON DIGITS 2 -5 OF RIGHT FOOT;  Surgeon: Blinda Katz, DPM;  Location: [facility];  Service: Podiatry;  Laterality: Right;   TEMPOROMANDIBULAR JOINT SURGERY     VARICOSE VEIN SURGERY     WEIL OSTEOTOMY Right 02/23/2022   Procedure: WEIL OSTEOTOMY OF METATARSALS 2;  Surgeon: Blinda Katz, DPM;  Location: [facility];  Service: Podiatry;  Laterality: Right;  Bilateral salpingo-oophorectomy   FAMILY HISTORY: Family History  Problem Relation Age of Onset   Hypertension Mother    Cancer Mother        Uterine and Breast   Cerebral aneurysm Mother    Breast cancer Mother    Arthritis Father    Hypertension Father    Glaucoma Father   [name]'s father died from Alzheimer's disease at age 36.  He had a history of bladder cancer.  Patients' mother is living at the age of 2 as of August 2020.   She was diagnosed with breast cancer in her 57s and also uterine cancer. The patient has 1 sister, no brothers. Patient denies anyone in her family having ovarian, prostate, or pancreatic cancer.    GYNECOLOGIC HISTORY:  No LMP recorded. Patient has had a hysterectomy. 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 Her husband, Cheryl, is a CPA. Her son, Marinda, lives in [place], is an airline pilot, is married and has 2 children. Her daughter, Ole, is an production designer, theatre/television/film that works with farmer education near [place].  She has 1 child and 1 on the way. [name] belongs to a [employer].    ADVANCED DIRECTIVES: In the absence of any documentation, [name]'s spouse, Cheryl, is  automatically her healthcare power of attorney.      HEALTH MAINTENANCE: Social History   Tobacco Use   Smoking status: Never   Smokeless tobacco: Never  Vaping Use   Vaping status: Never Used  Substance Use Topics   Alcohol  use: No   Drug use: No    Colonoscopy: 2017/[facility]  PAP: Status post hysterectomy  Bone density: Due in 09/2018 Mammography: Up to date  Allergies  Allergen Reactions   Fish Allergy Hives   Metoclopramide Hcl Other (See Comments)    Facial tightness    Penicillins Hives    Did it involve swelling of the face/tongue/throat, SOB, or low BP? No Did it involve sudden or severe rash/hives, skin peeling, or any reaction on the inside of your mouth or nose? No Did you need to seek medical attention at a hospital or doctor's office? No When did it last happen? ~40 years ago If all above answers are "NO", may proceed with cephalosporin use.    Shellfish Allergy Hives   Sulfamethoxazole-Trimethoprim Hives   Ciprofloxacin Palpitations   Gadolinium Derivatives Other (See Comments) and Cough    Pt. Felt contrast in her chest and began coughing and had the sniffles. No rash or other symptoms. No treatment required.    Nickel Rash    Current Outpatient Medications  Medication Sig  Dispense Refill   acetaminophen  (TYLENOL ) 500 MG tablet Take 1,000 mg by mouth every 6 (six) hours as needed (pain.).     albuterol  (PROAIR  HFA) 108 (90 BASE) MCG/ACT inhaler Inhale 1-2 puffs into the lungs every 4 (four) hours as needed. (Patient taking differently: Inhale 1-2 puffs into the lungs every 4 (four) hours as needed (asthma/wheezing/shortness of breath.).) 18 g 3   Cholecalciferol (VITAMIN D -3) 125 MCG (5000 UT) TABS Take 5,000 Units by mouth at bedtime.     Cinnamon 500 MG capsule Take 500 mg by mouth every evening.      clobetasol  (TEMOVATE ) 0.05 % external solution APPLY TOPICALLY TO THE AFFECTED AREA AT BEDTIME (Patient taking differently: Apply 1 Application topically  at bedtime as needed (irritation).) 50 mL 4   clobetasol  ointment (TEMOVATE ) 0.05 % Apply 1 application topically 2 (two) times daily. (Patient taking differently: Apply 1 application  topically 2 (two) times daily as needed (irritation).) 30 g 6   Dapsone 7.5 % GEL APPLY TO FACE EVERY NIGHT AT BEDTIME AS DIRECTED (Patient taking differently: Apply 1 Application topically at bedtime as needed (acne).) 90 g 1   desonide  (DESOWEN ) 0.05 % ointment desonide  0.05 % topical ointment (Patient taking differently: Apply 1 Application topically daily as needed (irritation). desonide  0.05 % topical ointment) 60 g 9   estradiol (ESTRACE) 0.1 MG/GM vaginal cream Place 0.5 g vaginally 2 (two) times a week.     famotidine (PEPCID) 20 MG tablet Take 20 mg by mouth 2 (two) times daily.     fexofenadine (ALLEGRA) 180 MG tablet Take 180 mg by mouth at bedtime.     Flaxseed, Linseed, (FLAX SEED OIL) 1000 MG CAPS Take 1,000 mg by mouth at bedtime.     Fluorouracil  (TOLAK ) 4 % CREA Aaa qhs 25 to 28 applications (Patient taking differently: Apply 1 Application topically at bedtime as needed (irritation). Aaa qhs 25 to 28 applications) 40 g 1   hydrochlorothiazide  (HYDRODIURIL ) 25 MG tablet Take 25 mg by mouth daily as needed (fluid retention.).     magnesium oxide (MAG-OX) 400 MG tablet Take 400 mg by mouth every evening.      montelukast  (SINGULAIR ) 10 MG tablet take 1 tablet by mouth once daily (Patient taking differently: Take 10 mg by mouth at bedtime.) 90 tablet 3   Multiple Vitamins-Minerals (MULTIVITAMIN ADULTS 50+ PO) Take 1 tablet by mouth daily.     nystatin-triamcinolone  ointment (MYCOLOG) Apply 1 Application topically daily as needed (irritation).     polycarbophil (FIBERCON) 625 MG tablet Take 625 mg by mouth as needed for moderate constipation.     Probiotic Product (PROBIOTIC PO) Take 1 capsule by mouth daily.     SPIRIVA  HANDIHALER 18 MCG inhalation capsule inhale THE CONTENTS OF ONE CAPSULE IN THE  HANDIHALER once daily (Patient taking differently: Place 18 mcg into inhaler and inhale daily.) 30 capsule 0   SYSTANE 0.4-0.3 % SOLN Place 1 drop into both eyes in the morning and at bedtime.     tamoxifen  (NOLVADEX ) 20 MG tablet Take 1 tablet (20 mg total) by mouth daily. 90 tablet 3   TURMERIC PO Take 1,000 mg by mouth at bedtime.     verapamil  (VERELAN  PM) 180 MG 24 hr capsule Take 180 mg by mouth at bedtime.     vitamin B-12 (CYANOCOBALAMIN ) 1000 MCG tablet Take 1,000 mcg by mouth at bedtime.      No current facility-administered medications for this visit.    OBJECTIVE: White woman who  appears younger than stated age  Vitals:   11/06/23 1406  BP: (!) 142/57  Pulse: 76  Resp: 17  Temp: 97.9 F (36.6 C)  SpO2: 95%      Body mass index is 27.79 kg/m.   Wt Readings from Last 3 Encounters:  11/06/23 161 lb 14.4 oz (73.4 kg)  11/01/22 158 lb 4.8 oz (71.8 kg)  02/23/22 157 lb 10.1 oz (71.5 kg)      ECOG FS:1 - Symptomatic but completely ambulatory  Physical Exam Constitutional:      Appearance: Normal appearance.  Chest:     Comments: Bilateral breasts inspected.  No palpable masses or regional adenopathy Musculoskeletal:     Cervical back: Normal range of motion and neck supple. No rigidity.  Lymphadenopathy:     Cervical: No cervical adenopathy.  Skin:    General: Skin is warm and dry.  Neurological:     General: No focal deficit present.     Mental Status: She is alert.     LAB RESULTS:  CMP     Component Value Date/Time   NA 135 02/21/2022 1430   K 3.7 02/21/2022 1430   CL 103 02/21/2022 1430   CO2 26 02/21/2022 1430   GLUCOSE 109 (H) 02/21/2022 1430   BUN 14 02/21/2022 1430   CREATININE 0.92 02/21/2022 1430   CREATININE 0.90 02/25/2015 1042   CALCIUM 8.5 (L) 02/21/2022 1430   PROT 7.2 05/22/2018 1042   ALBUMIN 3.8 05/22/2018 1042   AST 22 05/22/2018 1042   ALT 16 05/22/2018 1042   ALKPHOS 63 05/22/2018 1042   BILITOT 0.5 05/22/2018 1042    GFRNONAA >60 02/21/2022 1430   GFRNONAA 68 02/25/2015 1042   GFRAA >60 05/22/2018 1042   GFRAA 78 02/25/2015 1042    No results found for: TOTALPROTELP, ALBUMINELP, A1GS, A2GS, BETS, BETA2SER, GAMS, MSPIKE, SPEI  No results found for: KPAFRELGTCHN, LAMBDASER, KAPLAMBRATIO  Lab Results  Component Value Date   WBC 7.5 02/21/2022   NEUTROABS 4.6 02/21/2022   HGB 11.1 (L) 02/21/2022   HCT 33.4 (L) 02/21/2022   MCV 92.5 02/21/2022   PLT 295 02/21/2022    No results found for: LABCA2  No components found for: [id number]  No results for input(s): INR in the last 168 hours.  No results found for: LABCA2  No results found for: RJW800  No results found for: CAN125  No results found for: CAN153  No results found for: CA2729  No components found for: HGQUANT  No results found for: CEA1, CEA / No results found for: CEA1, CEA   No results found for: AFPTUMOR  No results found for: CHROMOGRNA  No results found for: TOTALPROTELP, ALBUMINELP, A1GS, A2GS, BETS, BETA2SER, GAMS, MSPIKE, SPEI (this displays SPEP labs)  No results found for: KPAFRELGTCHN, LAMBDASER, KAPLAMBRATIO (kappa/lambda light chains)  No results found for: HGBA, HGBA2QUANT, HGBFQUANT, HGBSQUAN (Hemoglobinopathy evaluation)   No results found for: LDH  Lab Results  Component Value Date   IRON 81 08/17/2014   TIBC 341 08/17/2014   IRONPCTSAT 24 08/17/2014   (Iron and TIBC)  Lab Results  Component Value Date   FERRITIN 21 08/17/2014    Urinalysis    Component Value Date/Time   COLORURINE YELLOW 09/20/2014 1143   APPEARANCEUR CLEAR 09/20/2014 1143   LABSPEC 1.019 09/20/2014 1143   PHURINE 6.5 09/20/2014 1143   GLUCOSEU NEGATIVE 09/20/2014 1143   HGBUR NEGATIVE 09/20/2014 1143   BILIRUBINUR negative 02/12/2015 1457   KETONESUR negative 02/12/2015 1457  KETONESUR NEGATIVE 09/20/2014 1143   PROTEINUR  negative 02/12/2015 1457   PROTEINUR NEGATIVE 09/20/2014 1143   UROBILINOGEN 0.2 02/12/2015 1457   UROBILINOGEN 0.2 05/11/2013 1029   NITRITE Negative 02/12/2015 1457   NITRITE NEGATIVE 09/20/2014 1143   LEUKOCYTESUR small (1+) (A) 02/12/2015 1457    STUDIES:  MR BREAST BILATERAL W WO CONTRAST INC CAD Result Date: 10/30/2023 CLINICAL DATA:  High-risk screening MRI. Patient was history of excisional biopsy of the left breast in 2020 demonstrating flat epithelial atypia and atypical lobular hyperplasia. Lifetime risk calculated greater than 30% by TC model. EXAM: BILATERAL BREAST MRI WITH AND WITHOUT CONTRAST TECHNIQUE: Multiplanar, multisequence MR images of both breasts were obtained prior to and following the intravenous administration of 7 ml of Gadavist  Three-dimensional MR images were rendered by post-processing of the original MR data on an independent workstation. The three-dimensional MR images were interpreted, and findings are reported in the following complete MRI report for this study. Three dimensional images were evaluated at the independent interpreting workstation using the DynaCAD thin client. COMPARISON:  Previous exam(s). FINDINGS: Breast composition: b. Scattered fibroglandular tissue. Background parenchymal enhancement: Minimal Right breast: No mass or abnormal enhancement. Left breast: No mass or abnormal enhancement. Lymph nodes: No abnormal appearing lymph nodes. Ancillary findings:  None. IMPRESSION: No MRI evidence of malignancy in either breast. RECOMMENDATION: 1.  Annual screening mammography in April of 2026 is recommended. 2.  Screening MRI in 1 year is recommended. Based on the recommendations of the American Cancer Society, annual screening MRI is suggested in addition to annual mammography if the patient has an estimated lifetime risk of developing breast cancer which is greater than 20%. BI-RADS CATEGORY  1: Negative. Electronically Signed   By: Dina  Arceo M.D.   On:  10/30/2023 11:20      ELIGIBLE FOR AVAILABLE RESEARCH PROTOCOL: no   ASSESSMENT: 73 y.o[place], [place] woman status post left lumpectomy 05/30/2018 showing atypical lobular hyperplasia --lifetime breast cancer risk 20-25%  (1) risk reduction: Tamoxifen  started 08/04/2018  (2) intensified screening:  (a) breast MRI yearly  (b) mammography 6 months apart from breast MRI  (c) biannual breast exam   PLAN:  Patient arrived for follow-up today.  She has been taking tamoxifen  as prescribed.  She would like to continue intensified screening.    Breast Cancer On Tamoxifen  with no new side effects. No leg swelling or vaginal bleeding. No changes noted in breasts on self-examination. -Refill Tamoxifen  prescription. -Continue current dose of Tamoxifen . -Return for mammogram in April and MRI in October. FU in 1 yr with us .  Recurrent Urinary Tract Infections Multiple recent UTIs. Referred to urologist with appointment in December. Started self on Ecoza protocol with D-mannose and has not had a UTI since starting this regimen. -Continue Ecoza protocol as it seems to be effective. -Follow up with urologist in December.   Total encounter time: 20 minutes *Total Encounter Time as defined by the Centers for Medicare and Medicaid Services includes, in addition to the face-to-face time of a patient visit (documented in the note above) non-face-to-face time: obtaining and reviewing outside history, ordering and reviewing medications, tests or procedures, care coordination (communications with other health care professionals or caregivers) and documentation in the medical record.

## 2023-11-11 DIAGNOSIS — N952 Postmenopausal atrophic vaginitis: Secondary | ICD-10-CM | POA: Diagnosis not present

## 2023-11-11 DIAGNOSIS — N6092 Unspecified benign mammary dysplasia of left breast: Secondary | ICD-10-CM | POA: Diagnosis not present

## 2023-11-11 DIAGNOSIS — N762 Acute vulvitis: Secondary | ICD-10-CM | POA: Diagnosis not present

## 2023-11-11 DIAGNOSIS — Z01419 Encounter for gynecological examination (general) (routine) without abnormal findings: Secondary | ICD-10-CM | POA: Diagnosis not present

## 2023-11-11 DIAGNOSIS — Z6827 Body mass index (BMI) 27.0-27.9, adult: Secondary | ICD-10-CM | POA: Diagnosis not present

## 2023-11-18 DIAGNOSIS — N3941 Urge incontinence: Secondary | ICD-10-CM | POA: Diagnosis not present

## 2024-04-29 ENCOUNTER — Ambulatory Visit

## 2024-11-06 ENCOUNTER — Inpatient Hospital Stay: Admitting: Hematology and Oncology
